# Patient Record
Sex: Male | Born: 1950 | Race: White | Hispanic: No | Marital: Married | Smoking: Former smoker
Health system: Southern US, Community
[De-identification: ages and names within clinical notes are randomized; demographics above are authoritative.]

## PROBLEM LIST (undated history)

## (undated) DIAGNOSIS — J9819 Other pulmonary collapse: Secondary | ICD-10-CM

## (undated) DIAGNOSIS — M1990 Unspecified osteoarthritis, unspecified site: Secondary | ICD-10-CM

## (undated) DIAGNOSIS — E119 Type 2 diabetes mellitus without complications: Secondary | ICD-10-CM

## (undated) DIAGNOSIS — I10 Essential (primary) hypertension: Secondary | ICD-10-CM

## (undated) HISTORY — PX: SHOULDER ARTHROCENTESIS: SUR47

---

## 1983-06-10 HISTORY — PX: LUNG BIOPSY: SHX232

## 2004-07-23 ENCOUNTER — Emergency Department: Payer: Self-pay | Admitting: Emergency Medicine

## 2006-01-01 ENCOUNTER — Ambulatory Visit (HOSPITAL_COMMUNITY): Admission: RE | Admit: 2006-01-01 | Discharge: 2006-01-01 | Payer: Self-pay | Admitting: Internal Medicine

## 2006-01-19 ENCOUNTER — Encounter (HOSPITAL_COMMUNITY): Admission: RE | Admit: 2006-01-19 | Discharge: 2006-02-18 | Payer: Self-pay | Admitting: Internal Medicine

## 2008-05-28 ENCOUNTER — Emergency Department (HOSPITAL_COMMUNITY): Admission: EM | Admit: 2008-05-28 | Discharge: 2008-05-28 | Payer: Self-pay | Admitting: Emergency Medicine

## 2008-10-14 ENCOUNTER — Emergency Department (HOSPITAL_COMMUNITY): Admission: EM | Admit: 2008-10-14 | Discharge: 2008-10-14 | Payer: Self-pay | Admitting: Emergency Medicine

## 2010-06-07 LAB — COMPREHENSIVE METABOLIC PANEL
AST: 33 U/L (ref 0–37)
Alkaline Phosphatase: 59 U/L (ref 39–117)
BUN: 12 mg/dL (ref 6–23)
Calcium: 9 mg/dL (ref 8.4–10.5)
Chloride: 103 mEq/L (ref 96–112)
Creatinine, Ser: 1.3 mg/dL (ref 0.4–1.5)
GFR calc Af Amer: >60 mL/min (ref >60)
Total Bilirubin: 0.6 mg/dL (ref 0.3–1.2)
Total Protein: 7.2 g/dL (ref 6.0–8.3)

## 2010-06-07 LAB — CBC
HCT: 42.8 % (ref 39.0–52.0)
Hemoglobin: 14.6 g/dL (ref 13.0–17.0)
MCHC: 34 g/dL (ref 30.0–36.0)
Platelets: 230 10*3/uL (ref 150–400)
RBC: 4.92 MIL/uL (ref 4.22–5.81)
WBC: 6.5 10*3/uL (ref 4.0–10.5)

## 2010-06-07 LAB — DIFFERENTIAL
Basophils Absolute: 0 10*3/uL (ref 0.0–0.1)
Basophils Relative: 0 % (ref 0–1)
Eosinophils Absolute: 0 10*3/uL (ref 0.0–0.7)
Eosinophils Relative: 0 % (ref 0–5)
Monocytes Relative: 10 % (ref 3–12)
Neutro Abs: 4.8 10*3/uL (ref 1.7–7.7)

## 2010-06-07 LAB — URINALYSIS, ROUTINE W REFLEX MICROSCOPIC
Glucose, UA: >1000 mg/dL — AB
Leukocytes, UA: NEGATIVE
Nitrite: NEGATIVE

## 2010-06-07 LAB — URINE MICROSCOPIC-ADD ON

## 2010-06-07 LAB — LIPASE, BLOOD: Lipase: 24 U/L (ref 11–59)

## 2010-06-12 LAB — URINALYSIS, ROUTINE W REFLEX MICROSCOPIC
Bilirubin Urine: NEGATIVE
Glucose, UA: >1000 mg/dL — AB
Protein, ur: 30 mg/dL — AB
Specific Gravity, Urine: 1.02 (ref 1.005–1.030)
Urobilinogen, UA: 0.2 mg/dL (ref 0.0–1.0)
pH: 6 (ref 5.0–8.0)

## 2010-06-12 LAB — CBC
HCT: 45.9 % (ref 39.0–52.0)
MCV: 86.9 fL (ref 78.0–100.0)
Platelets: 245 10*3/uL (ref 150–400)
RBC: 5.28 MIL/uL (ref 4.22–5.81)
RDW: 14.5 % (ref 11.5–15.5)
WBC: 8.3 10*3/uL (ref 4.0–10.5)

## 2010-06-12 LAB — GLUCOSE, CAPILLARY
Glucose-Capillary: 322 mg/dL — ABNORMAL HIGH (ref 70–99)
Glucose-Capillary: 324 mg/dL — ABNORMAL HIGH (ref 70–99)

## 2010-06-12 LAB — BASIC METABOLIC PANEL
BUN: 9 mg/dL (ref 6–23)
Calcium: 9.2 mg/dL (ref 8.4–10.5)
GFR calc non Af Amer: >60 mL/min (ref >60)
Sodium: 136 mEq/L (ref 135–145)

## 2010-06-12 LAB — DIFFERENTIAL
Basophils Absolute: 0 10*3/uL (ref 0.0–0.1)
Eosinophils Absolute: 0 10*3/uL (ref 0.0–0.7)
Eosinophils Relative: 0 % (ref 0–5)

## 2015-11-19 ENCOUNTER — Emergency Department (HOSPITAL_COMMUNITY)
Admission: EM | Admit: 2015-11-19 | Discharge: 2015-11-19 | Disposition: A | Discharge status: Home / Self Care | Payer: Self-pay | Attending: Emergency Medicine | Admitting: Emergency Medicine

## 2015-11-19 ENCOUNTER — Emergency Department (HOSPITAL_COMMUNITY): Payer: Self-pay

## 2015-11-19 ENCOUNTER — Encounter (HOSPITAL_COMMUNITY): Payer: Self-pay | Admitting: Emergency Medicine

## 2015-11-19 DIAGNOSIS — J069 Acute upper respiratory infection, unspecified: Secondary | ICD-10-CM | POA: Insufficient documentation

## 2015-11-19 DIAGNOSIS — H6502 Acute serous otitis media, left ear: Secondary | ICD-10-CM | POA: Insufficient documentation

## 2015-11-19 DIAGNOSIS — I10 Essential (primary) hypertension: Secondary | ICD-10-CM | POA: Insufficient documentation

## 2015-11-19 DIAGNOSIS — E119 Type 2 diabetes mellitus without complications: Secondary | ICD-10-CM | POA: Insufficient documentation

## 2015-11-19 HISTORY — DX: Essential (primary) hypertension: I10

## 2015-11-19 HISTORY — DX: Other pulmonary collapse: J98.19

## 2015-11-19 HISTORY — DX: Type 2 diabetes mellitus without complications: E11.9

## 2015-11-19 MED ORDER — AMOXICILLIN-POT CLAVULANATE 875-125 MG PO TABS: 1.0000 | ORAL_TABLET | Freq: Two times a day (BID) | ORAL | 0 refills | Status: DC

## 2015-11-19 NOTE — ED Provider Notes (Signed)
[place] DEPT Provider Note   CSN: [id number] Arrival date & time: 11/19/15  1540     History   Chief Complaint Chief Complaint  Patient presents with  . Otalgia    HPI Blake Rodriguez is a 65 y.o. male with a past medical history of diabetes, hypertension and hearing loss who presents emergency Department with chief complaint of left ear pressure and nasal congestion. Patient states he developed nasal congestion about 2 weeks ago. He has had some sneezing, facial pressure.  Past several days he's noticed worsening pressure and decreased hearing on the left side. He has had some associated pain. Patient states that sometimes he  Gets a bit dizzy if he turns his head to the left. Denies fevers, chills, myalgias, arthralgias. Denies DOE, SOB, chest tightness or pressure, radiation to left arm, jaw or back, or diaphoresis. Denies dysuria, flank pain, suprapubic pain, frequency, urgency, or hematuria. Denies headaches, light headedness, weakness, visual disturbances. Denies abdominal pain, nausea, vomiting, diarrhea or constipation.   HPI  Past Medical History:  Diagnosis Date  . Diabetes mellitus without complication ([place])   . Hypertension   . Lung collapse     There are no active problems to display for this patient.   Past Surgical History:  Procedure Laterality Date  . SHOULDER ARTHROCENTESIS         Home Medications    Prior to Admission medications   Not on File    Family History History reviewed. No pertinent family history.  Social History Social History  Substance Use Topics  . Smoking status: Never Smoker  . Smokeless tobacco: Never Used  . Alcohol use Yes     Comment: occassional     Allergies   Review of patient's allergies indicates not on file.   Review of Systems Review of Systems  Ten systems reviewed and are negative for acute change, except as noted in the HPI.   Physical Exam Updated Vital Signs BP 168/70 (BP Location: Left  Arm)   Pulse 79   Temp 98.6 F (37 C) (Oral)   Resp 20   Ht 5\' 10"  (1.778 m)   Wt 77.1 kg   SpO2 98%   BMI 24.39 kg/m   Physical Exam  Constitutional: He appears well-developed and well-nourished. No distress.  HENT:  Head: Normocephalic and atraumatic.  Right Ear: Hearing and tympanic membrane normal.  Left Ear: Ear canal normal. No mastoid tenderness. A middle ear effusion is present. No hemotympanum. Decreased hearing is noted.  Ears:  Eyes: Conjunctivae are normal. No scleral icterus.  Neck: Normal range of motion. Neck supple.  Cardiovascular: Normal rate, regular rhythm and normal heart sounds.   Pulmonary/Chest: Effort normal and breath sounds normal. No respiratory distress.  Abdominal: Soft. There is no tenderness.  Musculoskeletal: He exhibits no edema.  Neurological: He is alert.  Skin: Skin is warm and dry. He is not diaphoretic.  Psychiatric: His behavior is normal.  Nursing note and vitals reviewed.    ED Treatments / Results  Labs (all labs ordered are listed, but only abnormal results are displayed) Labs Reviewed - No data to display  EKG  EKG Interpretation None       Radiology Dg Chest 2 View  Result Date: 11/19/2015 CLINICAL DATA:  Subacute onset of productive cough and sinus congestion. Initial encounter. EXAM: CHEST  2 VIEW COMPARISON:  None. FINDINGS: The lungs are well-aerated. Right lateral pleural thickening is noted, with underlying postoperative change. Right apical scarring is seen. There  is no evidence of pleural effusion or pneumothorax. The heart is normal in size; the mediastinal contour is within normal limits. No acute osseous abnormalities are seen. IMPRESSION: 1. No acute cardiopulmonary process seen. 2. Chronic right-sided lung changes noted. Electronically Signed   By: Garald Balding M.D.   On: 11/19/2015 18:44    Procedures Procedures (including critical care time)  Medications Ordered in ED Medications - No data to  display   Initial Impression / Assessment and Plan / ED Course  I have reviewed the triage vital signs and the nursing notes.  Pertinent labs & imaging results that were available during my care of the patient were reviewed by me and considered in my medical decision making (see chart for details).  Clinical Course    Patient presents with otalgia and exam consistent with acute otitis media. No concern for acute mastoiditis, meningitis.   Patient discharged home with Augmentin.  Advised f/u with ENT  I have also discussed reasons to return immediately to the ER.  Patient expresses understanding and agrees with plan.     Final Clinical Impressions(s) / ED Diagnoses   Final diagnoses:  Acute serous otitis media of left ear, recurrence not specified  URI (upper respiratory infection)    New Prescriptions New Prescriptions   No medications on file     Margarita Mail, PA-C 11/19/15 1941    Elnora Morrison, MD 11/22/15 1427

## 2015-11-19 NOTE — Discharge Instructions (Signed)
You may have a cholesteatoma behind the Right ear drum. You will need to see an ear nose and throat doctor. You also have an ear infection on the left.  Please take all of your antibiotics until finished!   You may develop abdominal discomfort or diarrhea from the antibiotic.  You may help offset this with probiotics which you can buy or get in yogurt. Do not eat  or take the probiotics until 2 hours after your antibiotic.

## 2015-11-19 NOTE — ED Triage Notes (Signed)
Pt complaining of cold/sinus x 2 weeeks Ears are getting stopped up When he coughs he gets dizzy Pt sounds hoarse  Pt states that cold is better than it was.

## 2016-07-01 ENCOUNTER — Encounter (HOSPITAL_COMMUNITY): Payer: Self-pay | Admitting: Emergency Medicine

## 2016-07-01 ENCOUNTER — Emergency Department (HOSPITAL_COMMUNITY)
Admission: EM | Admit: 2016-07-01 | Discharge: 2016-07-01 | Disposition: A | Discharge status: Home / Self Care | Payer: PRIVATE HEALTH INSURANCE | Attending: Emergency Medicine | Admitting: Emergency Medicine

## 2016-07-01 DIAGNOSIS — M545 Low back pain, unspecified: Secondary | ICD-10-CM

## 2016-07-01 DIAGNOSIS — Z7984 Long term (current) use of oral hypoglycemic drugs: Secondary | ICD-10-CM | POA: Insufficient documentation

## 2016-07-01 DIAGNOSIS — Y929 Unspecified place or not applicable: Secondary | ICD-10-CM | POA: Insufficient documentation

## 2016-07-01 DIAGNOSIS — S39012A Strain of muscle, fascia and tendon of lower back, initial encounter: Secondary | ICD-10-CM | POA: Diagnosis not present

## 2016-07-01 DIAGNOSIS — Z79899 Other long term (current) drug therapy: Secondary | ICD-10-CM | POA: Insufficient documentation

## 2016-07-01 DIAGNOSIS — I10 Essential (primary) hypertension: Secondary | ICD-10-CM | POA: Insufficient documentation

## 2016-07-01 DIAGNOSIS — S3992XA Unspecified injury of lower back, initial encounter: Secondary | ICD-10-CM | POA: Diagnosis present

## 2016-07-01 DIAGNOSIS — Y990 Civilian activity done for income or pay: Secondary | ICD-10-CM | POA: Diagnosis not present

## 2016-07-01 DIAGNOSIS — E119 Type 2 diabetes mellitus without complications: Secondary | ICD-10-CM | POA: Diagnosis not present

## 2016-07-01 DIAGNOSIS — Y93E5 Activity, floor mopping and cleaning: Secondary | ICD-10-CM | POA: Diagnosis not present

## 2016-07-01 DIAGNOSIS — X503XXA Overexertion from repetitive movements, initial encounter: Secondary | ICD-10-CM | POA: Insufficient documentation

## 2016-07-01 MED ORDER — IBUPROFEN 400 MG PO TABS
400.0000 mg | ORAL_TABLET | Freq: Once | ORAL | Status: AC
  Administered 2016-07-01: 400 mg via ORAL
  Filled 2016-07-01: qty 1

## 2016-07-01 MED ORDER — METHOCARBAMOL 500 MG PO TABS
500.0000 mg | ORAL_TABLET | Freq: Once | ORAL | Status: AC
  Administered 2016-07-01: 500 mg via ORAL
  Filled 2016-07-01: qty 1

## 2016-07-01 MED ORDER — IBUPROFEN 400 MG PO TABS: 400.0000 mg | ORAL_TABLET | Freq: Four times a day (QID) | ORAL | 0 refills | Status: DC

## 2016-07-01 MED ORDER — TRAMADOL HCL 50 MG PO TABS
100.0000 mg | ORAL_TABLET | Freq: Once | ORAL | Status: AC
  Administered 2016-07-01: 100 mg via ORAL
  Filled 2016-07-01: qty 2

## 2016-07-01 MED ORDER — TRAMADOL HCL 50 MG PO TABS: 50.0000 mg | ORAL_TABLET | Freq: Four times a day (QID) | ORAL | 0 refills | Status: DC | PRN

## 2016-07-01 MED ORDER — ONDANSETRON HCL 4 MG PO TABS
4.0000 mg | ORAL_TABLET | Freq: Once | ORAL | Status: AC
  Administered 2016-07-01: 4 mg via ORAL
  Filled 2016-07-01: qty 1

## 2016-07-01 MED ORDER — METHOCARBAMOL 500 MG PO TABS: 500.0000 mg | ORAL_TABLET | Freq: Three times a day (TID) | ORAL | 0 refills | Status: DC

## 2016-07-01 NOTE — ED Triage Notes (Signed)
Pt c/o intermittent left lower back pain "a few months". Worse the past few days. States mops a lot with his job. Pain only with movement. Denies gu sx. nad

## 2016-07-01 NOTE — Discharge Instructions (Signed)
Please rest your back is much as possible. Please use a heating pad when you're sitting, or lying down. Use 400 mg of ibuprofen with breakfast, lunch, dinner, and at bedtime. Use Robaxin 3 times daily for spasm pain. This medication may cause drowsiness, please use caution changing positions and getting around. May use Ultram for more severe pain. This medicine may cause drowsiness, please do not drive or operate machinery when taking this medication. Please use caution when changing positions or getting around. Please see Dr. Aline Brochure for orthopedic evaluation if not improving in the next 5-7 days.

## 2016-07-02 NOTE — ED Provider Notes (Signed)
[facility] Provider Note   CSN: [id number] Arrival date & time: 07/01/16  0841     History   Chief Complaint Chief Complaint  Patient presents with  . Back Pain    HPI Blake Rodriguez is a 66 y.o. male.  Patient is a 66 year old male who presents to the emergency department with complaint of back pain.  The patient states that he has been having problems with his lower back for 2 or 3 months. He states that usually he can rest, or takes over-the-counter medication and it goes away. 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 Over the last day or 2 he is been having more and more problems getting rid of the back pain. He now has pain with change of position on. Pain is worse when he moves from a sitting to a standing position or from a standing to lying down position. He has not had any loss of bowel or bladder function. He's not had any numbness or loss of feeling in the inner thigh area or in the genital areas. He's not had any unusual falls or weakness.      Past Medical History:  Diagnosis Date  . Diabetes mellitus without complication ([place])   . Hypertension   . Lung collapse     There are no active problems to display for this patient.   Past Surgical History:  Procedure Laterality Date  . SHOULDER ARTHROCENTESIS         Home Medications    Prior to Admission medications   Medication Sig Start Date End Date Taking? Authorizing Provider  amLODipine (NORVASC) 10 MG tablet Take 10 mg by mouth daily.   Yes Historical Provider, MD  glipiZIDE (GLUCOTROL) 5 MG tablet Take 5 mg by mouth 2 (two) times daily before a meal.   Yes Historical Provider, MD  lisinopril (PRINIVIL,ZESTRIL) 10 MG tablet Take 10 mg by mouth daily.   Yes Historical Provider, MD  amoxicillin-clavulanate (AUGMENTIN) 875-125 MG tablet Take 1 tablet by mouth 2 (two) times daily. One po bid x 7  days Patient not taking: Reported on 07/01/2016 11/19/15   Margarita Mail, PA-C  ibuprofen (ADVIL,MOTRIN) 400 MG tablet Take 1 tablet (400 mg total) by mouth 4 (four) times daily. 07/01/16   Lily Kocher, PA-C  methocarbamol (ROBAXIN) 500 MG tablet Take 1 tablet (500 mg total) by mouth 3 (three) times daily. 07/01/16   Lily Kocher, PA-C  traMADol (ULTRAM) 50 MG tablet Take 1 tablet (50 mg total) by mouth every 6 (six) hours as needed. 07/01/16   Lily Kocher, PA-C    Family History History reviewed. No pertinent family history.  Social History Social History  Substance Use Topics  . Smoking status: Never Smoker  . Smokeless tobacco: Never Used  . Alcohol use Yes     Comment: occassional     Allergies   Patient has no allergy information on record.   Review of Systems Review of Systems  Constitutional: Negative for activity change.       All ROS Neg except as noted in HPI  HENT: Negative for nosebleeds.   Eyes: Negative for photophobia and discharge.  Respiratory: Negative for cough, shortness of breath and wheezing.   Cardiovascular: Negative for chest pain and palpitations.  Gastrointestinal: Negative for abdominal pain and blood in stool.  Genitourinary: Negative for dysuria, frequency and hematuria.  Musculoskeletal:  Positive for back pain. Negative for arthralgias and neck pain.  Skin: Negative.   Neurological: Negative for dizziness, seizures and speech difficulty.  Psychiatric/Behavioral: Negative for confusion and hallucinations.     Physical Exam Updated Vital Signs BP (!) 162/82 (BP Location: Right Arm)   Pulse 77   Temp 97.6 F (36.4 C) (Oral)   Resp 18   SpO2 99%   Physical Exam  Constitutional: He is oriented to person, place, and time. He appears well-developed and well-nourished.  Non-toxic appearance.  HENT:  Head: Normocephalic.  Right Ear: Tympanic membrane and external ear normal.  Left Ear: Tympanic membrane and external ear normal.  Eyes: EOM and  lids are normal. Pupils are equal, round, and reactive to light.  Neck: Normal range of motion. Neck supple. Carotid bruit is not present.  Cardiovascular: Normal rate, regular rhythm, normal heart sounds, intact distal pulses and normal pulses.   Pulmonary/Chest: Breath sounds normal. No respiratory distress.  Abdominal: Soft. Bowel sounds are normal. There is no tenderness. There is no guarding.  Musculoskeletal:       Lumbar back: He exhibits decreased range of motion and spasm.       Back:  Lymphadenopathy:       Head (right side): No submandibular adenopathy present.       Head (left side): No submandibular adenopathy present.    He has no cervical adenopathy.  Neurological: He is alert and oriented to person, place, and time. He has normal strength. No cranial nerve deficit or sensory deficit.  Skin: Skin is warm and dry.  Psychiatric: He has a normal mood and affect. His speech is normal.  Nursing note and vitals reviewed.    ED Treatments / Results  Labs (all labs ordered are listed, but only abnormal results are displayed) Labs Reviewed - No data to display  EKG  EKG Interpretation None       Radiology No results found.  Procedures Procedures (including critical care time)  Medications Ordered in ED Medications  methocarbamol (ROBAXIN) tablet 500 mg (500 mg Oral Given 07/01/16 1004)  ibuprofen (ADVIL,MOTRIN) tablet 400 mg (400 mg Oral Given 07/01/16 1004)  traMADol (ULTRAM) tablet 100 mg (100 mg Oral Given 07/01/16 1004)  ondansetron (ZOFRAN) tablet 4 mg (4 mg Oral Given 07/01/16 1004)     Initial Impression / Assessment and Plan / ED Course  I have reviewed the triage vital signs and the nursing notes.  Pertinent labs & imaging results that were available during my care of the patient were reviewed by me and considered in my medical decision making (see chart for details).       Final Clinical Impressions(s) / ED Diagnoses MDM The patient's blood pressure  is slightly elevated, otherwise the vital signs within normal limits. Pulse oximetry is 99% on room air. Within normal limits by my interpretation. The examination shows no evidence of acute neurologic deficit. In particular there is no evidence for caudal equina. The examination favors muscle strain involving the lumbar region.  I've asked patient to use heating pad to the area. I've asked him to rest his back over the next few days. Prescription for Ultram, Robaxin, and low-dose ibuprofen given to the patient. We discuss the possibility of these medications causing drowsiness. The patient has been asked to use caution when using the muscle relaxer and the pain medication. He acknowledges understanding of these discharge instructions. As well as his spouse.    Final diagnoses:  Strain of lumbar region, initial encounter  Left-sided low back pain without sciatica, unspecified chronicity    New Prescriptions Discharge Medication List as of 07/01/2016 10:02 AM    START taking these medications   Details  ibuprofen (ADVIL,MOTRIN) 400 MG tablet Take 1 tablet (400 mg total) by mouth 4 (four) times daily., Starting Wed 07/01/2016, Print    methocarbamol (ROBAXIN) 500 MG tablet Take 1 tablet (500 mg total) by mouth 3 (three) times daily., Starting Wed 07/01/2016, Print    traMADol (ULTRAM) 50 MG tablet Take 1 tablet (50 mg total) by mouth every 6 (six) hours as needed., Starting Wed 07/01/2016, Print         Lily Kocher, PA-C 07/02/16 1956    Nat Christen, MD 07/04/16 [phone]

## 2017-08-01 ENCOUNTER — Emergency Department (HOSPITAL_COMMUNITY)
Admission: EM | Admit: 2017-08-01 | Discharge: 2017-08-01 | Disposition: A | Discharge status: Home / Self Care | Payer: Managed Care, Other (non HMO) | Attending: Emergency Medicine | Admitting: Emergency Medicine

## 2017-08-01 ENCOUNTER — Other Ambulatory Visit: Payer: Self-pay

## 2017-08-01 ENCOUNTER — Encounter (HOSPITAL_COMMUNITY): Payer: Self-pay | Admitting: *Deleted

## 2017-08-01 DIAGNOSIS — Y92009 Unspecified place in unspecified non-institutional (private) residence as the place of occurrence of the external cause: Secondary | ICD-10-CM | POA: Insufficient documentation

## 2017-08-01 DIAGNOSIS — I10 Essential (primary) hypertension: Secondary | ICD-10-CM | POA: Diagnosis not present

## 2017-08-01 DIAGNOSIS — Z87891 Personal history of nicotine dependence: Secondary | ICD-10-CM | POA: Diagnosis not present

## 2017-08-01 DIAGNOSIS — Y929 Unspecified place or not applicable: Secondary | ICD-10-CM | POA: Insufficient documentation

## 2017-08-01 DIAGNOSIS — E119 Type 2 diabetes mellitus without complications: Secondary | ICD-10-CM | POA: Diagnosis not present

## 2017-08-01 DIAGNOSIS — T07XXXA Unspecified multiple injuries, initial encounter: Secondary | ICD-10-CM

## 2017-08-01 DIAGNOSIS — Y939 Activity, unspecified: Secondary | ICD-10-CM | POA: Insufficient documentation

## 2017-08-01 DIAGNOSIS — Z79899 Other long term (current) drug therapy: Secondary | ICD-10-CM | POA: Diagnosis not present

## 2017-08-01 DIAGNOSIS — R51 Headache: Secondary | ICD-10-CM | POA: Diagnosis present

## 2017-08-01 DIAGNOSIS — Y999 Unspecified external cause status: Secondary | ICD-10-CM | POA: Insufficient documentation

## 2017-08-01 DIAGNOSIS — W16212A Fall in (into) filled bathtub causing other injury, initial encounter: Secondary | ICD-10-CM | POA: Diagnosis not present

## 2017-08-01 DIAGNOSIS — T148XXA Other injury of unspecified body region, initial encounter: Secondary | ICD-10-CM | POA: Insufficient documentation

## 2017-08-01 DIAGNOSIS — W19XXXA Unspecified fall, initial encounter: Secondary | ICD-10-CM

## 2017-08-01 NOTE — ED Provider Notes (Signed)
[facility] EMERGENCY DEPARTMENT Provider Note   CSN: [id number] Arrival date & time: 08/01/17  1540     History   Chief Complaint Chief Complaint  Patient presents with  . Fall    HPI Blake Rodriguez is a 67 y.o. male.  Chief complaint is "my family will not leave me alone until I get evaluated".  HPI: 67 year old male.  His granddaughter had a piano recital 2 days ago.  He hurried home from work.  He was in the shower.  He states there was a lot of soap and sides.  The water became very hot and he moved backwards quickly.  When he did, his feet slipped and he fell backwards.  He struck his head against the tile wall.  No loss of conscious.  No confusion.  No nausea vomiting.  No vision changes.  No numbness or weakness to the extremities.  He has some discomfort along his shoulders  Past Medical History:  Diagnosis Date  . Diabetes mellitus without complication ([place])   . Hypertension   . Lung collapse     There are no active problems to display for this patient.   Past Surgical History:  Procedure Laterality Date  . SHOULDER ARTHROCENTESIS          Home Medications    Prior to Admission medications   Medication Sig Start Date End Date Taking? Authorizing Provider  amLODipine (NORVASC) 10 MG tablet Take 10 mg by mouth daily.   Yes [provider]  glipiZIDE (GLUCOTROL) 5 MG tablet Take 5 mg by mouth 2 (two) times daily before a meal.   Yes [provider]  ibuprofen (ADVIL,MOTRIN) 400 MG tablet Take 1 tablet (400 mg total) by mouth 4 (four) times daily. Patient taking differently: Take 400 mg by mouth every 4 (four) hours as needed.  07/01/16  Yes Lily Kocher, PA-C  lisinopril (PRINIVIL,ZESTRIL) 10 MG tablet Take 10 mg by mouth daily.   Yes [provider]    Family History History reviewed. No pertinent family history.  Social History Social History   Tobacco Use  . Smoking status: Former Research scientist (life sciences)  . Smokeless tobacco: Never Used    Substance Use Topics  . Alcohol use: Yes    Comment: occassional  . Drug use: No     Allergies   Patient has no known allergies.   Review of Systems Review of Systems  Constitutional: Negative for appetite change, chills, diaphoresis, fatigue and fever.  HENT: Negative for mouth sores, sore throat and trouble swallowing.   Eyes: Negative for visual disturbance.  Respiratory: Negative for cough, chest tightness, shortness of breath and wheezing.   Cardiovascular: Negative for chest pain.  Gastrointestinal: Negative for abdominal distention, abdominal pain, diarrhea, nausea and vomiting.  Endocrine: Negative for polydipsia, polyphagia and polyuria.  Genitourinary: Negative for dysuria, frequency and hematuria.  Musculoskeletal: Negative for gait problem.  Skin: Negative for color change, pallor and rash.  Neurological: Positive for headaches. Negative for dizziness, syncope and light-headedness.  Hematological: Does not bruise/bleed easily.  Psychiatric/Behavioral: Negative for behavioral problems and confusion.     Physical Exam Updated Vital Signs BP (!) 179/102 (BP Location: Right Arm)   Pulse 90   Temp 99.1 F (37.3 C) (Temporal)   Resp 18   Ht 5\' 10"  (1.778 m)   Wt 77.1 kg (170 lb)   SpO2 98%   BMI 24.39 kg/m   Physical Exam  Constitutional: He is oriented to person, place, and time. He appears  well-developed and well-nourished. No distress.  HENT:  Head: Normocephalic.  Atraumatic evaluation had no soft tissue swelling.  No ecchymosis.  No blood over the TMs, mastoids, or from ears nose or mouth.  Nontender of the midline neck and spine.  Eyes: Pupils are equal, round, and reactive to light. Conjunctivae are normal. No scleral icterus.  Neck: Normal range of motion. Neck supple. No thyromegaly present.  Cardiovascular: Normal rate and regular rhythm. Exam reveals no gallop and no friction rub.  No murmur heard. Pulmonary/Chest: Effort normal and breath sounds  normal. No respiratory distress. He has no wheezes. He has no rales.  Abdominal: Soft. Bowel sounds are normal. He exhibits no distension. There is no tenderness. There is no rebound.  Musculoskeletal: Normal range of motion.  Neurological: He is alert and oriented to person, place, and time.  Skin: Skin is warm and dry. No rash noted.  Psychiatric: He has a normal mood and affect. His behavior is normal.     ED Treatments / Results  Labs (all labs ordered are listed, but only abnormal results are displayed) Labs Reviewed - No data to display  EKG None  Radiology No results found.  Procedures Procedures (including critical care time)  Medications Ordered in ED Medications - No data to display   Initial Impression / Assessment and Plan / ED Course  I have reviewed the triage vital signs and the nursing notes.  Pertinent labs & imaging results that were available during my care of the patient were reviewed by me and considered in my medical decision making (see chart for details).    During exam.  No indication for CNS imaging.  Discharged home.  Expectant management.  Over-the-counter meds as needed.  Final Clinical Impressions(s) / ED Diagnoses   Final diagnoses:  Multiple contusions  Fall in home, initial encounter    ED Discharge Orders    None       Tanna Furry, MD 08/01/17 2041

## 2017-08-01 NOTE — Discharge Instructions (Addendum)
Recheck with any new or worsening concerns.

## 2017-08-01 NOTE — ED Triage Notes (Signed)
Pt fell last night, hitting his head on the wall, denies LOC, denies blood thinners.  C/o HA and left elbow pain

## 2017-10-13 ENCOUNTER — Ambulatory Visit (INDEPENDENT_AMBULATORY_CARE_PROVIDER_SITE_OTHER): Payer: Managed Care, Other (non HMO) | Admitting: Podiatry

## 2017-10-13 ENCOUNTER — Ambulatory Visit (INDEPENDENT_AMBULATORY_CARE_PROVIDER_SITE_OTHER): Payer: Managed Care, Other (non HMO)

## 2017-10-13 ENCOUNTER — Other Ambulatory Visit: Payer: Self-pay | Admitting: Podiatry

## 2017-10-13 ENCOUNTER — Encounter: Payer: Self-pay | Admitting: Podiatry

## 2017-10-13 VITALS — BP 138/73 | HR 77

## 2017-10-13 DIAGNOSIS — M216X2 Other acquired deformities of left foot: Secondary | ICD-10-CM

## 2017-10-13 DIAGNOSIS — R52 Pain, unspecified: Secondary | ICD-10-CM | POA: Diagnosis not present

## 2017-10-13 DIAGNOSIS — M2012 Hallux valgus (acquired), left foot: Secondary | ICD-10-CM

## 2017-10-13 DIAGNOSIS — M2011 Hallux valgus (acquired), right foot: Secondary | ICD-10-CM | POA: Diagnosis not present

## 2017-10-13 DIAGNOSIS — Q828 Other specified congenital malformations of skin: Secondary | ICD-10-CM

## 2017-10-13 NOTE — Progress Notes (Signed)
This patient presents the office with chief complaint of severe pain and burning on the bottom of his left foot.  He says he has a painful callus that is painful walking and wearing his shoes.  He says he has previously trimmed the callus himself but it always returns.  He also says he has developed a very painful bunion on his left foot. He says he has no pain associated with right foot.  This toe is painful and has difficulty wearing shoes except for his sandals.  This patient was referred to the office from the [place].  He presents the office today for an evaluation and treatment of his painful left forefoot.  General Appearance  Alert, conversant and in no acute stress.  Vascular  Dorsalis pedis and posterior tibial  pulses are palpable  bilaterally.  Capillary return is within normal limits  bilaterally. Temperature is within normal limits  bilaterally.  Neurologic  Senn-Weinstein monofilament wire diminished   bilaterally. Muscle power within normal limits bilaterally.  Nails Normal nails noted with no evidence of bacterial or fungal infection.  Orthopedic  No limitations of motion of motion feet .  No crepitus or effusions noted.  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  Porokeratosis sub 4th met left foot.  Mild HAV right.  Skin  normotropic skin  noted bilaterally.  No signs of infections or ulcers noted.  Porokeratosis sub 4  IE.   X-rays reveal a severe bunion deformity first MPJ of the left foot.  Patient has developed a porokeratosis sub 4th met left foot.  Debridement of porokeratosis was performed.  After discussion of his pathology with this patient, he requests an evaluation with a surgical podiatrist from this practice.  RTC prn.   Gardiner Barefoot DPM

## 2017-10-22 ENCOUNTER — Ambulatory Visit (INDEPENDENT_AMBULATORY_CARE_PROVIDER_SITE_OTHER): Payer: Medicare Other | Admitting: Podiatry

## 2017-10-22 ENCOUNTER — Encounter: Payer: Self-pay | Admitting: Podiatry

## 2017-10-22 DIAGNOSIS — E1151 Type 2 diabetes mellitus with diabetic peripheral angiopathy without gangrene: Secondary | ICD-10-CM | POA: Diagnosis not present

## 2017-10-22 DIAGNOSIS — M2012 Hallux valgus (acquired), left foot: Secondary | ICD-10-CM

## 2017-10-22 DIAGNOSIS — Q828 Other specified congenital malformations of skin: Secondary | ICD-10-CM

## 2017-10-22 DIAGNOSIS — M2011 Hallux valgus (acquired), right foot: Secondary | ICD-10-CM

## 2017-10-25 NOTE — Progress Notes (Signed)
Subjective: 67 year old male presents the office today for possible surgical consultation of his left foot.  Is a painful callus on the left foot as well as a bunion on the left foot although the bunion is not painful.  He states the toe described overlapping but they consider surgery for this.  He is diabetic and does not know his last A1c but he states that may have increased some last appointment.  Upon asking about claudication symptoms he does appear that he has had a vascular test done recently at the [place] and he was told he had a mild blockage but no intervention.  He said no treatment for the bunion and is only tried debridement of the callus in the left foot once. Denies any systemic complaints such as fevers, chills, nausea, vomiting. No acute changes since last appointment, and no other complaints at this time.   Objective: AAO x3, NAD DP pulse 2/4, PT pulse decreased, CRT < 3 seconds.  Hyperkeratotic lesion left foot between the fourth and fifth metatarsal heads.  This is minimal and there is no ulceration identified there is no drainage or pus or any swelling or redness.  Moderate HAV is present without any tenderness to palpation.  There is no pain or crepitation with MPJ range of motion.  No other areas of tenderness identified at this time.  No open lesions or pre-ulcerative lesions.  No pain with calf compression, swelling, warmth, erythema  Assessment: Hyperkeratotic lesion left foot with asymptomatic bunion  Plan: -All treatment options discussed with the patient including all alternatives, risks, complications.  -I independently reviewed the x-rays from previous with the patient as well.  -At this point he is having no significant discomfort to the bunion.  The callus is minimal today.  He is to have no significant conservative treatment.  We will eventually get diabetic shoes with inserts to help offload the areas that this was beneficial for him.  Also it appears that he has had  vascular studies performed at the [place] and I will try to get the results of this as well.  We will continue conservative care for now. -Patient encouraged to call the office with any questions, concerns, change in symptoms.   Trula Slade DPM

## 2017-11-08 ENCOUNTER — Telehealth: Payer: Self-pay | Admitting: *Deleted

## 2017-11-08 ENCOUNTER — Telehealth: Payer: Self-pay | Admitting: Podiatry

## 2017-11-08 ENCOUNTER — Encounter: Payer: Self-pay | Admitting: Podiatry

## 2017-11-08 NOTE — Telephone Encounter (Signed)
Please call pt back with results

## 2017-11-08 NOTE — Progress Notes (Signed)
Received from the [facility] his arterial studies  Right ABI 1.10  Left 0.86, compatible with mild arterial obstruction   If he elects to undergo elective foot surgery will need to be seen by vascular.

## 2017-11-08 NOTE — Telephone Encounter (Addendum)
Dr. Jacqualyn Posey states pt's circulation test show he does have a blockage and would need medical clearance prior consideration for surgery. I informed pt of Dr. Leigh Aurora review of results and pt states he is trying to get in touch with his PCP to discuss.

## 2017-11-08 NOTE — Telephone Encounter (Signed)
Dr. Jacqualyn Posey reviewed the ABI from the [facility] 08/26/2017, he states the results do show a blockage, if pt wants to consider bunion surgery he will need to get medical clearance. Left message on pt's mobile phone to call for results. Unable to leave a message on [phone] mailbox is full. Unable to leave a message on [phone] message states the call could not be completed as dialed.

## 2019-09-27 ENCOUNTER — Encounter (HOSPITAL_COMMUNITY): Payer: Self-pay

## 2019-09-27 ENCOUNTER — Emergency Department (HOSPITAL_COMMUNITY)
Admission: EM | Admit: 2019-09-27 | Discharge: 2019-09-28 | Discharge status: Against Medical Advice | Payer: Medicare PPO | Attending: Emergency Medicine | Admitting: Emergency Medicine

## 2019-09-27 ENCOUNTER — Other Ambulatory Visit: Payer: Self-pay

## 2019-09-27 DIAGNOSIS — K5900 Constipation, unspecified: Secondary | ICD-10-CM | POA: Diagnosis not present

## 2019-09-27 DIAGNOSIS — Z5321 Procedure and treatment not carried out due to patient leaving prior to being seen by health care provider: Secondary | ICD-10-CM | POA: Insufficient documentation

## 2019-09-27 NOTE — ED Triage Notes (Signed)
Pt presents to ED with complaints of constipation. Pt states last BM was 3 days ago. Pt states he tried having BM today but was unsuccessful. Pt has not taken any meds for constipation.

## 2019-09-28 ENCOUNTER — Emergency Department (HOSPITAL_COMMUNITY): Payer: Medicare PPO

## 2019-09-28 NOTE — ED Notes (Addendum)
Pt called rn to room stated that he did not want the x.r he said that he went to the restroom and had a good BM sp he just wants to go to go home. Informed he can come back if needed.

## 2019-09-28 NOTE — ED Provider Notes (Signed)
Patient not in room on attempted evaluation.   Ezequiel Essex, MD 09/28/19 [phone]

## 2021-04-26 ENCOUNTER — Emergency Department (HOSPITAL_COMMUNITY): Payer: No Typology Code available for payment source

## 2021-04-26 ENCOUNTER — Inpatient Hospital Stay (HOSPITAL_COMMUNITY)
Admission: EM | Admit: 2021-04-26 | Discharge: 2021-04-28 | DRG: 871 | Disposition: A | Discharge status: Home / Self Care | Payer: No Typology Code available for payment source | Attending: Family Medicine | Admitting: Family Medicine

## 2021-04-26 ENCOUNTER — Other Ambulatory Visit: Payer: Self-pay

## 2021-04-26 ENCOUNTER — Encounter (HOSPITAL_COMMUNITY): Payer: Self-pay

## 2021-04-26 DIAGNOSIS — Z7984 Long term (current) use of oral hypoglycemic drugs: Secondary | ICD-10-CM

## 2021-04-26 DIAGNOSIS — E860 Dehydration: Secondary | ICD-10-CM | POA: Diagnosis present

## 2021-04-26 DIAGNOSIS — E8729 Other acidosis: Secondary | ICD-10-CM | POA: Diagnosis not present

## 2021-04-26 DIAGNOSIS — E1165 Type 2 diabetes mellitus with hyperglycemia: Secondary | ICD-10-CM | POA: Diagnosis present

## 2021-04-26 DIAGNOSIS — R1031 Right lower quadrant pain: Secondary | ICD-10-CM

## 2021-04-26 DIAGNOSIS — E872 Acidosis, unspecified: Secondary | ICD-10-CM | POA: Diagnosis present

## 2021-04-26 DIAGNOSIS — Z20822 Contact with and (suspected) exposure to covid-19: Secondary | ICD-10-CM | POA: Diagnosis present

## 2021-04-26 DIAGNOSIS — Z79899 Other long term (current) drug therapy: Secondary | ICD-10-CM

## 2021-04-26 DIAGNOSIS — Z7982 Long term (current) use of aspirin: Secondary | ICD-10-CM | POA: Diagnosis not present

## 2021-04-26 DIAGNOSIS — E782 Mixed hyperlipidemia: Secondary | ICD-10-CM

## 2021-04-26 DIAGNOSIS — I10 Essential (primary) hypertension: Secondary | ICD-10-CM | POA: Diagnosis present

## 2021-04-26 DIAGNOSIS — A419 Sepsis, unspecified organism: Secondary | ICD-10-CM | POA: Diagnosis present

## 2021-04-26 DIAGNOSIS — R1084 Generalized abdominal pain: Secondary | ICD-10-CM | POA: Diagnosis present

## 2021-04-26 DIAGNOSIS — E785 Hyperlipidemia, unspecified: Secondary | ICD-10-CM

## 2021-04-26 DIAGNOSIS — K3532 Acute appendicitis with perforation and localized peritonitis, without abscess: Secondary | ICD-10-CM | POA: Diagnosis present

## 2021-04-26 DIAGNOSIS — N179 Acute kidney failure, unspecified: Secondary | ICD-10-CM | POA: Diagnosis present

## 2021-04-26 DIAGNOSIS — R109 Unspecified abdominal pain: Secondary | ICD-10-CM

## 2021-04-26 DIAGNOSIS — Z87891 Personal history of nicotine dependence: Secondary | ICD-10-CM

## 2021-04-26 DIAGNOSIS — N289 Disorder of kidney and ureter, unspecified: Secondary | ICD-10-CM | POA: Diagnosis not present

## 2021-04-26 LAB — COMPREHENSIVE METABOLIC PANEL
ALT: 17 U/L (ref 0–44)
AST: 20 U/L (ref 15–41)
Albumin: 4.1 g/dL (ref 3.5–5.0)
Alkaline Phosphatase: 65 U/L (ref 38–126)
Anion gap: 17 — ABNORMAL HIGH (ref 5–15)
BUN: 21 mg/dL (ref 8–23)
CO2: 16 mmol/L — ABNORMAL LOW (ref 22–32)
Calcium: 9.1 mg/dL (ref 8.9–10.3)
Chloride: 107 mmol/L (ref 98–111)
Creatinine, Ser: 1.59 mg/dL — ABNORMAL HIGH (ref 0.61–1.24)
GFR, Estimated: 47 mL/min — ABNORMAL LOW (ref >60)
Glucose, Bld: 176 mg/dL — ABNORMAL HIGH (ref 70–99)
Potassium: 3.8 mmol/L (ref 3.5–5.1)
Sodium: 140 mmol/L (ref 135–145)
Total Bilirubin: 1.7 mg/dL — ABNORMAL HIGH (ref 0.3–1.2)
Total Protein: 8.1 g/dL (ref 6.5–8.1)

## 2021-04-26 LAB — CBC WITH DIFFERENTIAL/PLATELET
Abs Immature Granulocytes: 0.06 10*3/uL (ref 0.00–0.07)
Basophils Absolute: 0 10*3/uL (ref 0.0–0.1)
Basophils Relative: 0 %
Eosinophils Absolute: 0.2 10*3/uL (ref 0.0–0.5)
Eosinophils Relative: 2 %
HCT: 51.7 % (ref 39.0–52.0)
Hemoglobin: 16.1 g/dL (ref 13.0–17.0)
Immature Granulocytes: 0 %
Lymphocytes Relative: 3 %
Lymphs Abs: 0.4 10*3/uL — ABNORMAL LOW (ref 0.7–4.0)
MCH: 29 pg (ref 26.0–34.0)
MCHC: 31.1 g/dL (ref 30.0–36.0)
MCV: 93.2 fL (ref 80.0–100.0)
Monocytes Absolute: 0.6 10*3/uL (ref 0.1–1.0)
Monocytes Relative: 4 %
Neutro Abs: 12.6 10*3/uL — ABNORMAL HIGH (ref 1.7–7.7)
Neutrophils Relative %: 91 %
Platelets: 209 10*3/uL (ref 150–400)
RBC: 5.55 MIL/uL (ref 4.22–5.81)
RDW: 14.5 % (ref 11.5–15.5)
WBC: 13.9 10*3/uL — ABNORMAL HIGH (ref 4.0–10.5)
nRBC: 0 % (ref 0.0–0.2)

## 2021-04-26 LAB — GLUCOSE, CAPILLARY
Glucose-Capillary: 129 mg/dL — ABNORMAL HIGH (ref 70–99)
Glucose-Capillary: 139 mg/dL — ABNORMAL HIGH (ref 70–99)
Glucose-Capillary: 134 mg/dL — ABNORMAL HIGH (ref 70–99)

## 2021-04-26 LAB — RESP PANEL BY RT-PCR (FLU A&B, COVID) ARPGX2
Influenza A by PCR: NEGATIVE
Influenza B by PCR: NEGATIVE
SARS Coronavirus 2 by RT PCR: NEGATIVE

## 2021-04-26 LAB — LACTIC ACID, PLASMA
Lactic Acid, Venous: 2.6 mmol/L (ref 0.5–1.9)
Lactic Acid, Venous: 1.5 mmol/L (ref 0.5–1.9)

## 2021-04-26 LAB — HEMOGLOBIN A1C
Hgb A1c MFr Bld: 7.6 % — ABNORMAL HIGH (ref 4.8–5.6)
Mean Plasma Glucose: 171.42 mg/dL

## 2021-04-26 LAB — LIPASE, BLOOD: Lipase: 34 U/L (ref 11–51)

## 2021-04-26 LAB — HIV ANTIBODY (ROUTINE TESTING W REFLEX): HIV Screen 4th Generation wRfx: NONREACTIVE

## 2021-04-26 LAB — CBG MONITORING, ED: Glucose-Capillary: 131 mg/dL — ABNORMAL HIGH (ref 70–99)

## 2021-04-26 MED ORDER — ENOXAPARIN SODIUM 40 MG/0.4ML IJ SOSY
40.0000 mg | PREFILLED_SYRINGE | INTRAMUSCULAR | Status: DC
  Administered 2021-04-26 – 2021-04-28 (×3): 40 mg via SUBCUTANEOUS
  Filled 2021-04-26 (×3): qty 0.4

## 2021-04-26 MED ORDER — SODIUM CHLORIDE 0.9 % IV BOLUS
1000.0000 mL | Freq: Once | INTRAVENOUS | Status: AC
  Administered 2021-04-26: 1000 mL via INTRAVENOUS

## 2021-04-26 MED ORDER — HYDROMORPHONE HCL 1 MG/ML IJ SOLN
0.5000 mg | INTRAMUSCULAR | Status: DC | PRN
Start: 2021-04-26 — End: 2021-04-26

## 2021-04-26 MED ORDER — ATORVASTATIN CALCIUM 40 MG PO TABS
80.0000 mg | ORAL_TABLET | Freq: Every day | ORAL | Status: DC
  Administered 2021-04-26 – 2021-04-28 (×3): 80 mg via ORAL
  Filled 2021-04-26 (×3): qty 2

## 2021-04-26 MED ORDER — ONDANSETRON HCL 4 MG/2ML IJ SOLN
4.0000 mg | Freq: Once | INTRAMUSCULAR | Status: AC
  Administered 2021-04-26: 4 mg via INTRAVENOUS
  Filled 2021-04-26: qty 2

## 2021-04-26 MED ORDER — PIPERACILLIN-TAZOBACTAM 3.375 G IVPB
3.3750 g | Freq: Once | INTRAVENOUS | Status: AC
  Administered 2021-04-26: 3.375 g via INTRAVENOUS
  Filled 2021-04-26: qty 50

## 2021-04-26 MED ORDER — SODIUM CHLORIDE 0.9 % IV SOLN
INTRAVENOUS | Status: DC
Start: 2021-04-26 — End: 2021-04-27

## 2021-04-26 MED ORDER — HYDROMORPHONE HCL 1 MG/ML IJ SOLN
1.0000 mg | INTRAMUSCULAR | Status: DC | PRN
  Administered 2021-04-26 – 2021-04-27 (×2): 1 mg via INTRAVENOUS
  Filled 2021-04-26 (×3): qty 1

## 2021-04-26 MED ORDER — INSULIN ASPART 100 UNIT/ML IJ SOLN
0.0000 [IU] | INTRAMUSCULAR | Status: DC
  Administered 2021-04-26 – 2021-04-27 (×4): 1 [IU] via SUBCUTANEOUS
  Administered 2021-04-27: 2 [IU] via SUBCUTANEOUS
  Administered 2021-04-27: 1 [IU] via SUBCUTANEOUS

## 2021-04-26 MED ORDER — LABETALOL HCL 5 MG/ML IV SOLN
10.0000 mg | INTRAVENOUS | Status: DC | PRN
Start: 2021-04-26 — End: 2021-04-28
  Administered 2021-04-27: 10 mg via INTRAVENOUS
  Filled 2021-04-26: qty 4

## 2021-04-26 MED ORDER — PIPERACILLIN-TAZOBACTAM 3.375 G IVPB
3.3750 g | Freq: Three times a day (TID) | INTRAVENOUS | Status: DC
  Administered 2021-04-26 – 2021-04-28 (×7): 3.375 g via INTRAVENOUS
  Filled 2021-04-26 (×7): qty 50

## 2021-04-26 MED ORDER — TAMSULOSIN HCL 0.4 MG PO CAPS
0.4000 mg | ORAL_CAPSULE | Freq: Every day | ORAL | Status: DC
Start: 2021-04-26 — End: 2021-04-28
  Administered 2021-04-26 – 2021-04-27 (×2): 0.4 mg via ORAL
  Filled 2021-04-26 (×2): qty 1

## 2021-04-26 MED ORDER — IOHEXOL 300 MG/ML  SOLN
100.0000 mL | Freq: Once | INTRAMUSCULAR | Status: AC | PRN
  Administered 2021-04-26: 100 mL via INTRAVENOUS

## 2021-04-26 MED ORDER — LISINOPRIL 10 MG PO TABS
40.0000 mg | ORAL_TABLET | Freq: Every day | ORAL | Status: DC
  Administered 2021-04-26 – 2021-04-28 (×3): 40 mg via ORAL
  Filled 2021-04-26 (×3): qty 4

## 2021-04-26 MED ORDER — MORPHINE SULFATE (PF) 4 MG/ML IV SOLN
4.0000 mg | Freq: Once | INTRAVENOUS | Status: AC
  Administered 2021-04-26: 4 mg via INTRAVENOUS
  Filled 2021-04-26: qty 1

## 2021-04-26 MED ORDER — AMLODIPINE BESYLATE 5 MG PO TABS
10.0000 mg | ORAL_TABLET | Freq: Every day | ORAL | Status: DC
  Administered 2021-04-26 – 2021-04-28 (×3): 10 mg via ORAL
  Filled 2021-04-26 (×3): qty 2

## 2021-04-26 NOTE — ED Triage Notes (Signed)
Constipation for 2 days. Says what comes out "doesn't amount to much" Thought it would go away . Hasnt taken anything

## 2021-04-26 NOTE — Assessment & Plan Note (Signed)
CT abdomen and pelvis with contrast showed multiple renal lesions Follow-up nonemergent abdominal MRI with and without IV gadolinium is recommended in the near future after resolution of the patient's acute illness to definitively characterize this finding and exclude neoplasm.

## 2021-04-26 NOTE — Progress Notes (Addendum)
PROGRESS NOTE     Blake Rodriguez, is a 72 y.o. male, DOB - [birth year], WUJ:[id number]  Admit date - 04/26/2021   Admitting Physician Bernadette Hoit, DO  Outpatient Primary MD for the patient is [facility] [place]  LOS - 0  Chief Complaint  Patient presents with   Constipation        Brief Narrative:  71 y.o. male with medical history significant for hypertension, hyperlipidemia, T2DM admitted on 04/26/2021 with sepsis secondary to perforated appendicitis- -Now requiring IV antibiotics IV pain medications and IV fluids     -Assessment and Plan: -Sepsis secondary to perforated appendicitis- POA -Lactic acidosis resolved with IV fluids -Leukocytosis, tachycardia and tachypnea noted -Continue IV Zosyn -Continue IV fluids while n.p.o. -IV Dilaudid as needed -IV Zofran as needed -General surgery consult from Dr. Constance Haw appreciated  -HTN--elevated BP noted most likely due to pain, restart amlodipine 10 mg daily - hold lisinopril due to AKI   may use IV labetalol when necessary  Every 4 hours for systolic blood pressure over 160 mmhg  HLD--continue Lipitor  DM2-check A1c Hold metformin and glipizide while n.p.o. Use Novolog/Humalog Sliding scale insulin with Accu-Cheks/Fingersticks as ordered   Kidney lesion CT abdomen and pelvis with contrast showed multiple renal lesions Follow-up nonemergent abdominal MRI with and without IV gadolinium is recommended in the near future after resolution of the patient's acute illness to definitively characterize this finding and exclude neoplasm. -Repeat abdominal imaging as outpatient advised  AKI (acute kidney injury) ([place]) BUN/creatinine 21/1.59 (no recent creatinine for comparison).   -Suspect dehydration related -Continue IV fluids while n.p.o. -Hold lisinopril Renally adjust medications, avoid nephrotoxic agents/dehydration/hypotension  -Total care time is over 43 minutes  Disposition/Need for in-Hospital Stay- patient  unable to be discharged at this time due to --sepsis secondary to perforated appendectomy requiring IV fluids and IV antibiotics pending return of bowel function and tolerance of oral intake*  Status is: Inpatient   Disposition: The patient is from: Home              Anticipated d/c is to: Home              Anticipated d/c date is: 2 days              Patient currently is not medically stable to d/c. Barriers: Not Clinically Stable-   Code Status :  -  Code Status: Full Code   Family Communication:    (patient is alert, awake and coherent)  Discussed with wife   DVT Prophylaxis  :   - SCDs  enoxaparin (LOVENOX) injection 40 mg Start: 04/26/21 1000 SCDs Start: 04/26/21 0802   Lab Results  Component Value Date   PLT 209 04/26/2021    Inpatient Medications  Scheduled Meds:  amLODipine  10 mg Oral Daily   atorvastatin  80 mg Oral Daily   enoxaparin (LOVENOX) injection  40 mg Subcutaneous Q24H   insulin aspart  0-9 Units Subcutaneous Q4H   lisinopril  40 mg Oral Daily   Continuous Infusions:  sodium chloride 150 mL/hr at 04/26/21 0959   piperacillin-tazobactam (ZOSYN)  IV     PRN Meds:.HYDROmorphone (DILAUDID) injection, labetalol   Anti-infectives (From admission, onward)    Start     Dose/Rate Route Frequency Ordered Stop   04/26/21 1400  piperacillin-tazobactam (ZOSYN) IVPB 3.375 g        3.375 g 12.5 mL/hr over 240 Minutes Intravenous Every 8 hours 04/26/21 0728     04/26/21 0530  piperacillin-tazobactam (ZOSYN) IVPB 3.375 g        3.375 g 12.5 mL/hr over 240 Minutes Intravenous  Once 04/26/21 0528 04/26/21 0945         Subjective: Blake Rodriguez today has no fevers,   No chest pain,   -Nausea with no emesis,  abdominal pain persist -Wife at bedside, questions answered   Objective: Vitals:   04/26/21 0430 04/26/21 0530 04/26/21 0800 04/26/21 0947  BP: (!) 161/87 (!) 149/88 (!) 156/77 (!) 155/82  Pulse: (!) 118 (!) 118 (!) 117 (!) 116  Resp: (!) 28 (!) 22  19   Temp:      TempSrc:      SpO2: 96% 97% 95% 98%  Weight:      Height:        Intake/Output Summary (Last 24 hours) at 04/26/2021 1050 Last data filed at 04/26/2021 0546 Gross per 24 hour  Intake 1000 ml  Output --  Net 1000 ml   Filed Weights   04/26/21 0050  Weight: 77.1 kg    Physical Exam  Gen:- Awake Alert,  in no apparent distress  HEENT:- [place].AT, No sclera icterus Neck-Supple Neck,No JVD,.  Lungs-  CTAB , fair symmetrical air movement CV- S1, S2 normal, regular  Abd-  +ve B.Sounds, Abd Soft, generalized lower abdominal tenderness with slight rebound and  guarding    Extremity/Skin:- No  edema, pedal pulses present  Psych-affect is appropriate, oriented x3 Neuro-no new focal deficits, no tremors  Data Reviewed: I have personally reviewed following labs and imaging studies  CBC: Recent Labs  Lab 04/26/21 0359  WBC 13.9*  NEUTROABS 12.6*  HGB 16.1  HCT 51.7  MCV 93.2  PLT 329   Basic Metabolic Panel: Recent Labs  Lab 04/26/21 0359  NA 140  K 3.8  CL 107  CO2 16*  GLUCOSE 176*  BUN 21  CREATININE 1.59*  CALCIUM 9.1   GFR: Estimated Creatinine Clearance: 43.8 mL/min (A) (by C-G formula based on SCr of 1.59 mg/dL (H)). Liver Function Tests: Recent Labs  Lab 04/26/21 0359  AST 20  ALT 17  ALKPHOS 65  BILITOT 1.7*  PROT 8.1  ALBUMIN 4.1   Cardiac Enzymes: No results for input(s): CKTOTAL, CKMB, CKMBINDEX, TROPONINI in the last 168 hours. BNP (last 3 results) No results for input(s): PROBNP in the last 8760 hours. HbA1C: No results for input(s): HGBA1C in the last 72 hours. Sepsis Labs: @LABRCNTIP (procalcitonin:4,lacticidven:4) ) Recent Results (from the past 240 hour(s))  Resp Panel by RT-PCR (Flu A&B, Covid) Nasopharyngeal Swab     Status: None   Collection Time: 04/26/21  5:51 AM   Specimen: Nasopharyngeal Swab; Nasopharyngeal(NP) swabs in vial transport medium  Result Value Ref Range Status   SARS Coronavirus 2 by RT PCR NEGATIVE  NEGATIVE Final    Comment: (NOTE) SARS-CoV-2 target nucleic acids are NOT DETECTED.  The SARS-CoV-2 RNA is generally detectable in upper respiratory specimens during the acute phase of infection. The lowest concentration of SARS-CoV-2 viral copies this assay can detect is 138 copies/mL. A negative result does not preclude SARS-Cov-2 infection and should not be used as the sole basis for treatment or other patient management decisions. A negative result may occur with  improper specimen collection/handling, submission of specimen other than nasopharyngeal swab, presence of viral mutation(s) within the areas targeted by this assay, and inadequate number of viral copies(<138 copies/mL). A negative result must be combined with clinical observations, patient history, and epidemiological information. The expected result is Negative.  Fact Sheet for Patients:  EntrepreneurPulse.com.au  Fact Sheet for Healthcare Providers:  IncredibleEmployment.be  This test is no t yet approved or cleared by the [place] FDA and  has been authorized for detection and/or diagnosis of SARS-CoV-2 by FDA under an Emergency Use Authorization (EUA). This EUA will remain  in effect (meaning this test can be used) for the duration of the COVID-19 declaration under Section 564(b)(1) of the Act, 21 U.S.C.section 360bbb-3(b)(1), unless the authorization is terminated  or revoked sooner.       Influenza A by PCR NEGATIVE NEGATIVE Final   Influenza B by PCR NEGATIVE NEGATIVE Final    Comment: (NOTE) The Xpert Xpress SARS-CoV-2/FLU/RSV plus assay is intended as an aid in the diagnosis of influenza from Nasopharyngeal swab specimens and should not be used as a sole basis for treatment. Nasal washings and aspirates are unacceptable for Xpert Xpress SARS-CoV-2/FLU/RSV testing.  Fact Sheet for Patients: EntrepreneurPulse.com.au  Fact Sheet for Healthcare  Providers: IncredibleEmployment.be  This test is not yet approved or cleared by the [place] FDA and has been authorized for detection and/or diagnosis of SARS-CoV-2 by FDA under an Emergency Use Authorization (EUA). This EUA will remain in effect (meaning this test can be used) for the duration of the COVID-19 declaration under Section 564(b)(1) of the Act, 21 U.S.C. section 360bbb-3(b)(1), unless the authorization is terminated or revoked.  Performed at [facility], [address], [place], [place] [address]       Radiology Studies: CT ABDOMEN PELVIS W CONTRAST  Result Date: 04/26/2021 CLINICAL DATA:  71 year old male with history of acute onset of nonlocalized abdominal pain. Nausea and vomiting. EXAM: CT ABDOMEN AND PELVIS WITH CONTRAST TECHNIQUE: Multidetector CT imaging of the abdomen and pelvis was performed using the standard protocol following bolus administration of intravenous contrast. RADIATION DOSE REDUCTION: This exam was performed according to the departmental dose-optimization program which includes automated exposure control, adjustment of the mA and/or kV according to patient size and/or use of iterative reconstruction technique. CONTRAST:  130mL OMNIPAQUE IOHEXOL 300 MG/ML  SOLN COMPARISON:  No priors. FINDINGS: Lower chest: Elevation of the left hemidiaphragm. Mild scarring in the lung bases. Hepatobiliary: In segment 6 of the liver (axial image 27 of series 2) there is a 1.9 x 1.5 cm well-defined low-attenuation lesion compatible with a simple cyst. No other suspicious appearing hepatic lesions are noted. No intra or extrahepatic biliary ductal dilatation. Gallbladder is normal in appearance. Pancreas: No pancreatic mass. No pancreatic ductal dilatation. No pancreatic or peripancreatic fluid collections or inflammatory changes. Spleen: Unremarkable. Adrenals/Urinary Tract: Multiple low-attenuation lesions in both kidneys, compatible with simple cysts,  largest of which is exophytic in the upper pole of the right kidney measuring up to 3.9 cm in diameter. Other lesions are either too small to definitively characterize or are considered indeterminate. The best example of this is a lesion in the interpolar region of the right kidney (axial image 25 of series 2) measuring 2.0 x 2.1 cm which is intermediate attenuation (29 HU) and slightly irregular in shape. No hydroureteronephrosis. Urinary bladder is normal in appearance. Bilateral adrenal glands are normal in appearance. Stomach/Bowel: The appearance of the stomach is normal. There is no pathologic dilatation of small bowel or colon. The appendix is dilated and inflamed, compatible with an acute appendicitis. Appendix: Location: Posterior and medial to the cecum Diameter: 12 mm Appendicolith: None Mucosal hyper-enhancement: Present Extraluminal gas: Trace amount Periappendiceal collection: Small amount of fluid in the periappendiceal region and in the  low anatomic pelvis. At this time, this appears non organized. Vascular/Lymphatic: Aortic atherosclerosis with fusiform ectasia of the infrarenal abdominal aorta which measures up to 2.7 x 2.6 cm. No lymphadenopathy noted in the abdomen or pelvis. Reproductive: Prostate gland and seminal vesicles are unremarkable in appearance. Other: Small volume of ascites and trace volume of pneumoperitoneum, most evident in the low anatomic pelvis around the region of the inflamed appendix. Musculoskeletal: There are no aggressive appearing lytic or blastic lesions noted in the visualized portions of the skeleton. IMPRESSION: 1. Acute perforated appendicitis, as above. Surgical consultation is strongly recommended. 2. Multiple renal lesions. The majority of these appear to represent simple cysts. There is one lesion in the interpolar region of the right kidney which is considered indeterminate (favored to be a mildly proteinaceous cyst). Follow-up nonemergent abdominal MRI with  and without IV gadolinium is recommended in the near future after resolution of the patient's acute illness to definitively characterize this finding and exclude neoplasm. 3. Aortic atherosclerosis with fusiform ectasia of the infrarenal abdominal aorta which measures up to 2.7 x 2.6 cm. 4. Additional incidental findings, as above. Critical Value/emergent results were called by telephone at the time of interpretation on 04/26/2021 at 5:28 am to provider Veryl Speak, who verbally acknowledged these results. Electronically Signed   By: Vinnie Langton M.D.   On: 04/26/2021 05:28     Scheduled Meds:  amLODipine  10 mg Oral Daily   atorvastatin  80 mg Oral Daily   enoxaparin (LOVENOX) injection  40 mg Subcutaneous Q24H   insulin aspart  0-9 Units Subcutaneous Q4H   lisinopril  40 mg Oral Daily   Continuous Infusions:  sodium chloride 150 mL/hr at 04/26/21 0959   piperacillin-tazobactam (ZOSYN)  IV       LOS: 0 days   Roxan Hockey M.D on 04/26/2021 at 10:50 AM  Go to [email or web address] - for contact info  [facility] - Office  [phone]  If 7PM-7AM, please contact night-coverage [email or web address] Password [email or web address] 04/26/2021, 10:50 AM

## 2021-04-26 NOTE — Progress Notes (Signed)
°   04/26/21 0947  Assess: MEWS Score  Temp 98 F (36.7 C)  BP (!) 155/82  Pulse Rate (!) 116  Resp 19  SpO2 98 %  O2 Device Room Air  Assess: MEWS Score  MEWS Temp 0  MEWS Systolic 0  MEWS Pulse 2  MEWS RR 0  MEWS LOC 0  MEWS Score 2  MEWS Score Color Yellow  Assess: if the MEWS score is Yellow or Red  Were vital signs taken at a resting state? Yes  Focused Assessment No change from prior assessment  Early Detection of Sepsis Score *See Row Information* High  MEWS guidelines implemented *See Row Information* Yes  Treat  MEWS Interventions Administered scheduled meds/treatments  Pain Scale 0-10  Pain Score 5  Pain Type Acute pain  Pain Location Abdomen  Pain Frequency Intermittent  Patients Stated Pain Goal 0  Pain Intervention(s) Rest  Take Vital Signs  Increase Vital Sign Frequency  Yellow: Q 2hr X 2 then Q 4hr X 2, if remains yellow, continue Q 4hrs  Escalate  MEWS: Escalate Yellow: discuss with charge nurse/RN and consider discussing with provider and RRT  Notify: Charge Nurse/RN  Name of Charge Nurse/RN Notified Apolonio Schneiders, RN  Date Charge Nurse/RN Notified 04/26/21  Time Charge Nurse/RN Notified 1751  Notify: Provider  Provider Name/Title Dr. Joesph Fillers  Date Provider Notified 04/26/21  Time Provider Notified [phone]  Notification Type Page  Provider response No new orders  Date of Provider Response 04/26/21  Time of Provider Response [phone]  Document  Patient Outcome Stabilized after interventions  Progress note created (see row info) Yes

## 2021-04-26 NOTE — Assessment & Plan Note (Signed)
Continue IV hydration ?

## 2021-04-26 NOTE — Assessment & Plan Note (Addendum)
Sepsis secondary to acute perforated appendicitis Patient met sepsis criteria due to elevated WBC, tachycardia, tachypnea and abdomen as source of infection Patient was able to maintain normal good blood pressure Continue IV hydration Continue IV Zosyn  Continue IV Dilaudid as needed Lactic acid pending General surgery was already consulted and will see patient this morning.  We shall await further recommendations

## 2021-04-26 NOTE — Assessment & Plan Note (Signed)
Continue management as described for acute perforated appendicitis

## 2021-04-26 NOTE — Progress Notes (Signed)
Pharmacy Antibiotic Note  Blake Rodriguez is a 71 y.o. male admitted on 04/26/2021 with IAI.  Pharmacy has been consulted for Zosyn dosing.  Plan: Zosyn 3.375g IV q8h (4 hour infusion).  Height: 5\' 9"  (175.3 cm) Weight: 77.1 kg (169 lb 15.6 oz) IBW/kg (Calculated) : 70.7  Temp (24hrs), Avg:97.7 F (36.5 C), Min:97.7 F (36.5 C), Max:97.7 F (36.5 C)  Recent Labs  Lab 04/26/21 0359  WBC 13.9*  CREATININE 1.59*    Estimated Creatinine Clearance: 43.8 mL/min (A) (by C-G formula based on SCr of 1.59 mg/dL (H)).    No Known Allergies  Antimicrobials this admission: Zosyn 2/25 >>    Dose adjustments this admission:   Microbiology results:  BCx: pending  UCx:    Sputum:    MRSA PCR:   Thank you for allowing pharmacy to be a part of this patients care.  Hart Robinsons A 04/26/2021 7:29 AM

## 2021-04-26 NOTE — Assessment & Plan Note (Addendum)
Continue ISS and hypoglycemia protocol Metformin and glipizide will be held at this time

## 2021-04-26 NOTE — H&P (Signed)
°History and Physical  ° ° °Patient: Blake Rodriguez MRN:[id number] DOB: 02/19/1951 °DOA: 04/26/2021 °DOS: the patient was seen and examined on 04/26/2021 °PCP: [facility]  °Patient coming from: Home ° °Chief Complaint:  °Chief Complaint  °Patient presents with  ° Constipation  ° ° °HPI: Blake Rodriguez is a 71 y.o. male with medical history significant for hypertension, hyperlipidemia, T2DM who presents to the emergency department accompanied by daughter due to 1 day onset of abdominal pain.  Abdominal pain was in RLQ and suprapubic area and was rated as 10/10 on pain scale, this was associated with nausea and nonbloody vomiting.  It was initially thought that this was due to constipation, since he feels like he needed to have a bowel movement but was unable, so he decided to go to the ED for further evaluation and management.  Patient denies fever, chills, chest pain, shortness of breath, or any prior abdominal surgery. ° °ED course: °In the emergency department, he was tachypneic and tachycardic, BP was 146/67.  Work-up in ED showed anion gap metabolic acidosis, CBG 176, BUN/creatinine 21/1.59 (no recent creatinine for comparison).  Lipase 34 °CT abdomen and pelvis with contrast showed acute perforated appendicitis °Patient was treated with IV Zosyn, IV hydration was provided, morphine was given due to abdominal pain and Zofran due to nausea and vomiting.  General surgery was consulted and will see patient this morning per ED physician.  Hospitalist was asked to admit patient for further evaluation and management. ° ° ° °Review of Systems: As mentioned in the history of present illness. All other systems reviewed and are negative. °Past Medical History:  °Diagnosis Date  ° Diabetes mellitus without complication (HCC)   ° Hypertension   ° Lung collapse   ° °Past Surgical History:  °Procedure Laterality Date  ° SHOULDER ARTHROCENTESIS    ° °Social History:  reports that he has quit smoking. He has never  used smokeless tobacco. He reports current alcohol use. He reports that he does not use drugs. ° °No Known Allergies ° °History reviewed. No pertinent family history. ° °Prior to Admission medications   °Medication Sig Start Date End Date Taking? Authorizing Provider  °amLODipine (NORVASC) 10 MG tablet Take 10 mg by mouth daily.    [provider]  °aspirin EC 81 MG tablet Take 81 mg by mouth daily.    [provider]  °atorvastatin (LIPITOR) 80 MG tablet  08/19/17   [provider]  °glipiZIDE (GLUCOTROL) 5 MG tablet Take 5 mg by mouth 2 (two) times daily before a meal.    [provider]  °lisinopril (PRINIVIL,ZESTRIL) 40 MG tablet  08/03/17   [provider]  °metFORMIN (GLUCOPHAGE-XR) 500 MG 24 hr tablet  08/23/17   [provider]  ° ° °Physical Exam: °Vitals:  ° 04/26/21 0300 04/26/21 0330 04/26/21 0430 04/26/21 0530  °BP: (!) 163/83 (!) 171/79 (!) 161/87 (!) 149/88  °Pulse: (!) 120 (!) 117 (!) 118 (!) 118  °Resp: 18 (!) 22 (!) 28 (!) 22  °Temp:      °TempSrc:      °SpO2: 97% 96% 96% 97%  °Weight:      °Height:      ° °General: Patient was awake and alert and oriented x3. Ill appearing but not in any acute distress.  °HEENT: NCAT.  PERRLA. EOMI. Sclerae anicteric.  Dry mucosal membranes. °Neck: Neck supple without lymphadenopathy. No carotid bruits. No masses palpated.  °Cardiovascular: Tachycardia.  Regular rate with   normal S1-S2 sounds. No murmurs, rubs or gallops auscultated. No JVD.  °Respiratory: Tachypnea.  Clear breath sounds.  No accessory muscle use. °Abdomen: Soft, tender to palpation of RLQ and suprapubic area.  Positive Rovsing sign.  Active bowel sounds.  °Skin: No rashes, lesions, or ulcerations.  Dry, warm to touch. °Musculoskeletal:  2+ dorsalis pedis and radial pulses. Good ROM.  No contractures  °Psychiatric: Intact judgment and insight.  Mood appropriate to current condition. °Neurologic: No focal neurological deficits. Strength is 5/5 x 4.   CN II - XII grossly intact. ° ° °Data Reviewed: °EKG personally reviewed showed sinus tachycardia at a rate of 119 bpm ° °Assessment and Plan: °* Acute perforated appendicitis- (present on admission) °Sepsis secondary to acute perforated appendicitis °Patient met sepsis criteria due to elevated WBC, tachycardia, tachypnea and abdomen as source of infection °Patient was able to maintain normal good blood pressure °Continue IV hydration °Continue IV Zosyn  °Continue IV Dilaudid as needed °Lactic acid pending °General surgery was already consulted and will see patient this morning.  We shall await further recommendations ° °Sepsis (HCC) °Continue management as described for acute perforated appendicitis ° °High anion gap metabolic acidosis °This is possibly secondary to patient's perforated appendix/dehydration °Continue IV hydration and continue to monitor for anion gap closure °Continue IV antibiotics ° °Abdominal pain °Continue management as described for acute perforated appendicitis ° °AKI (acute kidney injury) (HCC) °BUN/creatinine 21/1.59 (no recent creatinine for comparison).   °Continue IV hydration °Renally adjust medications, avoid nephrotoxic agents/dehydration/hypotension ° ° °Dehydration °Continue IV hydration ° °Hyperlipidemia °Continue Lipitor ° °Essential hypertension °Continue amlodipine ° °Kidney lesion °CT abdomen and pelvis with contrast showed multiple renal lesions °Follow-up nonemergent abdominal MRI with and without IV gadolinium is recommended in the near future after resolution of the patient's acute illness to definitively characterize this finding and exclude neoplasm. ° °Type 2 diabetes mellitus with hyperglycemia (HCC) °Continue ISS and hypoglycemia protocol ° ° °Advance Care Planning: CODE STATUS: Full code ° °Consults: General surgery ° °Family Communication: Daughter at bedside (all questions answered to satisfaction) ° °Severity of Illness: °The appropriate patient status for this  patient is INPATIENT. Inpatient status is judged to be reasonable and necessary in order to provide the required intensity of service to ensure the patient's safety. The patient's presenting symptoms, physical exam findings, and initial radiographic and laboratory data in the context of their chronic comorbidities is felt to place them at high risk for further clinical deterioration. Furthermore, it is not anticipated that the patient will be medically stable for discharge from the hospital within 2 midnights of admission.  ° °* I certify that at the point of admission it is my clinical judgment that the patient will require inpatient hospital care spanning beyond 2 midnights from the point of admission due to high intensity of service, high risk for further deterioration and high frequency of surveillance required.* ° °Author: °Oladapo Adefeso, DO °04/26/2021 7:19 AM ° °For on call review [email or web address].  °

## 2021-04-26 NOTE — Assessment & Plan Note (Signed)
BUN/creatinine 21/1.59 (no recent creatinine for comparison).   Continue IV hydration Renally adjust medications, avoid nephrotoxic agents/dehydration/hypotension

## 2021-04-26 NOTE — Consult Note (Signed)
[facility] Consult  Reason for Consult: Acute perforated appendicitis  Referring Physician:  Dr. Stark Jock, Dr. Denton Brick   Chief Complaint   Constipation     HPI: Blake Rodriguez is a 71 y.o. male with acute perforated appendicitis on CT scan. He came in with lower abdominal pain and associated nausea and vomiting for the past 24 hours. He had pain that was 10/10 and was most in the suprapubic area. He had no fevers or chills. He was evaluated in the ED and CT demonstrated a perforated appendicitis.   The ED called and I recommended Iv antibiotic and admission with the hospitalist.   Past Medical History:  Diagnosis Date   Diabetes mellitus without complication ([place])    Hypertension    Lung collapse     Past Surgical History:  Procedure Laterality Date   SHOULDER ARTHROCENTESIS      History reviewed. No pertinent family history.  Social History   Tobacco Use   Smoking status: Former   Smokeless tobacco: Never  Scientific laboratory technician Use: Never used  Substance Use Topics   Alcohol use: Yes    Comment: occassional   Drug use: No    Medications: I have reviewed the patient's current medications. Prior to Admission:  Medications Prior to Admission  Medication Sig Dispense Refill Last Dose   amLODipine (NORVASC) 10 MG tablet Take 10 mg by mouth daily.   04/25/2021   aspirin EC 81 MG tablet Take 81 mg by mouth daily.   04/25/2021   atorvastatin (LIPITOR) 80 MG tablet Take 80 mg by mouth daily.   04/24/2021   empagliflozin (JARDIANCE) 25 MG TABS tablet Take 1 tablet by mouth daily.   04/25/2021   glipiZIDE (GLUCOTROL) 5 MG tablet Take 5 mg by mouth 2 (two) times daily before a meal.   04/25/2021   lisinopril (PRINIVIL,ZESTRIL) 40 MG tablet Take 40 mg by mouth daily.   04/25/2021   metFORMIN (GLUCOPHAGE-XR) 500 MG 24 hr tablet Take 500 mg by mouth daily.   04/24/2021   Scheduled:  amLODipine  10 mg Oral Daily   atorvastatin  80 mg Oral Daily   enoxaparin (LOVENOX)  injection  40 mg Subcutaneous Q24H   insulin aspart  0-9 Units Subcutaneous Q4H   lisinopril  40 mg Oral Daily   Continuous:  sodium chloride 150 mL/hr at 04/26/21 0959   piperacillin-tazobactam (ZOSYN)  IV     ZWC:HENIDPOEUMPNT (DILAUDID) injection, labetalol  No Known Allergies   ROS:  A comprehensive review of systems was negative except for: Gastrointestinal: positive for abdominal pain, nausea, and vomiting  Blood pressure (!) 155/82, pulse (!) 116, temperature 97.7 F (36.5 C), temperature source Oral, resp. rate 19, height 5\' 9"  (1.753 m), weight 77.1 kg, SpO2 98 %. Physical Exam Vitals reviewed.  Constitutional:      Appearance: Normal appearance.  HENT:     Head: Normocephalic.     Nose: Nose normal.  Eyes:     Pupils: Pupils are equal, round, and reactive to light.  Pulmonary:     Effort: Pulmonary effort is normal.  Abdominal:     General: There is distension.     Palpations: Abdomen is soft.     Tenderness: There is abdominal tenderness in the right lower quadrant and suprapubic area.  Musculoskeletal:        General: No swelling.     Cervical back: Normal range of motion.  Skin:    General: Skin is warm.  Neurological:  General: No focal deficit present.     Mental Status: He is alert.  Psychiatric:        Mood and Affect: Mood normal.    Results: Results for orders placed or performed during the hospital encounter of 04/26/21 (from the past 48 hour(s))  Comprehensive metabolic panel     Status: Abnormal   Collection Time: 04/26/21  3:59 AM  Result Value Ref Range   Sodium 140 135 - 145 mmol/L   Potassium 3.8 3.5 - 5.1 mmol/L   Chloride 107 98 - 111 mmol/L   CO2 16 (L) 22 - 32 mmol/L   Glucose, Bld 176 (H) 70 - 99 mg/dL    Comment: Glucose reference range applies only to samples taken after fasting for at least 8 hours.   BUN 21 8 - 23 mg/dL   Creatinine, Ser 1.59 (H) 0.61 - 1.24 mg/dL   Calcium 9.1 8.9 - 10.3 mg/dL   Total Protein 8.1 6.5 -  8.1 g/dL   Albumin 4.1 3.5 - 5.0 g/dL   AST 20 15 - 41 U/L   ALT 17 0 - 44 U/L   Alkaline Phosphatase 65 38 - 126 U/L   Total Bilirubin 1.7 (H) 0.3 - 1.2 mg/dL   GFR, Estimated 47 (L) >60 mL/min    Comment: (NOTE) Calculated using the CKD-EPI Creatinine Equation (2021)    Anion gap 17 (H) 5 - 15    Comment: Performed at [facility], [address] [place], [place] [address]  Lipase, blood     Status: None   Collection Time: 04/26/21  3:59 AM  Result Value Ref Range   Lipase 34 11 - 51 U/L    Comment: Performed at [facility], [address], [place], [place] [address]  CBC with Differential     Status: Abnormal   Collection Time: 04/26/21  3:59 AM  Result Value Ref Range   WBC 13.9 (H) 4.0 - 10.5 K/uL   RBC 5.55 4.22 - 5.81 MIL/uL   Hemoglobin 16.1 13.0 - 17.0 g/dL   HCT 51.7 39.0 - 52.0 %   MCV 93.2 80.0 - 100.0 fL   MCH 29.0 26.0 - 34.0 pg   MCHC 31.1 30.0 - 36.0 g/dL   RDW 14.5 11.5 - 15.5 %   Platelets 209 150 - 400 K/uL   nRBC 0.0 0.0 - 0.2 %   Neutrophils Relative % 91 %   Neutro Abs 12.6 (H) 1.7 - 7.7 K/uL   Lymphocytes Relative 3 %   Lymphs Abs 0.4 (L) 0.7 - 4.0 K/uL   Monocytes Relative 4 %   Monocytes Absolute 0.6 0.1 - 1.0 K/uL   Eosinophils Relative 2 %   Eosinophils Absolute 0.2 0.0 - 0.5 K/uL   Basophils Relative 0 %   Basophils Absolute 0.0 0.0 - 0.1 K/uL   Immature Granulocytes 0 %   Abs Immature Granulocytes 0.06 0.00 - 0.07 K/uL    Comment: Performed at [facility], [address] [place], [place] [address]  Resp Panel by RT-PCR (Flu A&B, Covid) Nasopharyngeal Swab     Status: None   Collection Time: 04/26/21  5:51 AM   Specimen: Nasopharyngeal Swab; Nasopharyngeal(NP) swabs in vial transport medium  Result Value Ref Range   SARS Coronavirus 2 by RT PCR NEGATIVE NEGATIVE    Comment: (NOTE) SARS-CoV-2 target nucleic acids are NOT DETECTED.  The SARS-CoV-2 RNA is generally detectable in upper respiratory specimens during the acute phase of  infection. The lowest concentration of  SARS-CoV-2 viral copies this assay can detect is 138 copies/mL. A negative result does not preclude SARS-Cov-2 infection and should not be used as the sole basis for treatment or other patient management decisions. A negative result may occur with  improper specimen collection/handling, submission of specimen other than nasopharyngeal swab, presence of viral mutation(s) within the areas targeted by this assay, and inadequate number of viral copies(<138 copies/mL). A negative result must be combined with clinical observations, patient history, and epidemiological information. The expected result is Negative.  Fact Sheet for Patients:  EntrepreneurPulse.com.au  Fact Sheet for Healthcare Providers:  IncredibleEmployment.be  This test is no t yet approved or cleared by the [place] FDA and  has been authorized for detection and/or diagnosis of SARS-CoV-2 by FDA under an Emergency Use Authorization (EUA). This EUA will remain  in effect (meaning this test can be used) for the duration of the COVID-19 declaration under Section 564(b)(1) of the Act, 21 U.S.C.section 360bbb-3(b)(1), unless the authorization is terminated  or revoked sooner.       Influenza A by PCR NEGATIVE NEGATIVE   Influenza B by PCR NEGATIVE NEGATIVE    Comment: (NOTE) The Xpert Xpress SARS-CoV-2/FLU/RSV plus assay is intended as an aid in the diagnosis of influenza from Nasopharyngeal swab specimens and should not be used as a sole basis for treatment. Nasal washings and aspirates are unacceptable for Xpert Xpress SARS-CoV-2/FLU/RSV testing.  Fact Sheet for Patients: EntrepreneurPulse.com.au  Fact Sheet for Healthcare Providers: IncredibleEmployment.be  This test is not yet approved or cleared by the [place] FDA and has been authorized for detection and/or diagnosis of SARS-CoV-2 by FDA  under an Emergency Use Authorization (EUA). This EUA will remain in effect (meaning this test can be used) for the duration of the COVID-19 declaration under Section 564(b)(1) of the Act, 21 U.S.C. section 360bbb-3(b)(1), unless the authorization is terminated or revoked.  Performed at [facility], [address] [place], [place] [address]   Lactic acid, plasma     Status: Abnormal   Collection Time: 04/26/21  6:47 AM  Result Value Ref Range   Lactic Acid, Venous 2.6 (HH) 0.5 - 1.9 mmol/L    Comment: CRITICAL RESULT CALLED TO, READ BACK BY AND VERIFIED WITH: OAKLEY B @ 0729 ON [id number] BY HENDERSON L Performed at [facility], [address] [place], [place] [address]   Lactic acid, plasma     Status: None   Collection Time: 04/26/21  8:22 AM  Result Value Ref Range   Lactic Acid, Venous 1.5 0.5 - 1.9 mmol/L    Comment: Performed at [facility], [address], [place], [place] [address]  CBG monitoring, ED     Status: Abnormal   Collection Time: 04/26/21  8:25 AM  Result Value Ref Range   Glucose-Capillary 131 (H) 70 - 99 mg/dL    Comment: Glucose reference range applies only to samples taken after fasting for at least 8 hours.   Personally reviewed and reviewed with patient and family- dilated appendix with fluid, small foci of gas, appendicolith  CT ABDOMEN PELVIS W CONTRAST  Result Date: 04/26/2021 CLINICAL DATA:  71 year old male with history of acute onset of nonlocalized abdominal pain. Nausea and vomiting. EXAM: CT ABDOMEN AND PELVIS WITH CONTRAST TECHNIQUE: Multidetector CT imaging of the abdomen and pelvis was performed using the standard protocol following bolus administration of intravenous contrast. RADIATION DOSE REDUCTION: This exam was performed according to the departmental dose-optimization program which includes automated exposure control, adjustment of the  mA and/or kV according to patient size and/or use of iterative reconstruction technique. CONTRAST:  114mL  OMNIPAQUE IOHEXOL 300 MG/ML  SOLN COMPARISON:  No priors. FINDINGS: Lower chest: Elevation of the left hemidiaphragm. Mild scarring in the lung bases. Hepatobiliary: In segment 6 of the liver (axial image 27 of series 2) there is a 1.9 x 1.5 cm well-defined low-attenuation lesion compatible with a simple cyst. No other suspicious appearing hepatic lesions are noted. No intra or extrahepatic biliary ductal dilatation. Gallbladder is normal in appearance. Pancreas: No pancreatic mass. No pancreatic ductal dilatation. No pancreatic or peripancreatic fluid collections or inflammatory changes. Spleen: Unremarkable. Adrenals/Urinary Tract: Multiple low-attenuation lesions in both kidneys, compatible with simple cysts, largest of which is exophytic in the upper pole of the right kidney measuring up to 3.9 cm in diameter. Other lesions are either too small to definitively characterize or are considered indeterminate. The best example of this is a lesion in the interpolar region of the right kidney (axial image 25 of series 2) measuring 2.0 x 2.1 cm which is intermediate attenuation (29 HU) and slightly irregular in shape. No hydroureteronephrosis. Urinary bladder is normal in appearance. Bilateral adrenal glands are normal in appearance. Stomach/Bowel: The appearance of the stomach is normal. There is no pathologic dilatation of small bowel or colon. The appendix is dilated and inflamed, compatible with an acute appendicitis. Appendix: Location: Posterior and medial to the cecum Diameter: 12 mm Appendicolith: None Mucosal hyper-enhancement: Present Extraluminal gas: Trace amount Periappendiceal collection: Small amount of fluid in the periappendiceal region and in the low anatomic pelvis. At this time, this appears non organized. Vascular/Lymphatic: Aortic atherosclerosis with fusiform ectasia of the infrarenal abdominal aorta which measures up to 2.7 x 2.6 cm. No lymphadenopathy noted in the abdomen or pelvis.  Reproductive: Prostate gland and seminal vesicles are unremarkable in appearance. Other: Small volume of ascites and trace volume of pneumoperitoneum, most evident in the low anatomic pelvis around the region of the inflamed appendix. Musculoskeletal: There are no aggressive appearing lytic or blastic lesions noted in the visualized portions of the skeleton. IMPRESSION: 1. Acute perforated appendicitis, as above. Surgical consultation is strongly recommended. 2. Multiple renal lesions. The majority of these appear to represent simple cysts. There is one lesion in the interpolar region of the right kidney which is considered indeterminate (favored to be a mildly proteinaceous cyst). Follow-up nonemergent abdominal MRI with and without IV gadolinium is recommended in the near future after resolution of the patient's acute illness to definitively characterize this finding and exclude neoplasm. 3. Aortic atherosclerosis with fusiform ectasia of the infrarenal abdominal aorta which measures up to 2.7 x 2.6 cm. 4. Additional incidental findings, as above. Critical Value/emergent results were called by telephone at the time of interpretation on 04/26/2021 at 5:28 am to provider Veryl Speak, who verbally acknowledged these results. Electronically Signed   By: Vinnie Langton M.D.   On: 04/26/2021 05:28     Assessment & Plan:  Blake Rodriguez is a 71 y.o. male with acute perforated appendicitis. Plan for management with IV antibiotics. No IR drain needed at this time, Discussed that we will monitor and he may need IR drain if develops an abscess. Discussed potential for ileus given his nausea/vomiting and distention.   NPO, ice and sips ok with meds IV zosyn for perforated appendicitis Labs tomorrow Will monitor for ileus Will monitor for need to repeat CT pending abscess formation and IR drain need   All questions were answered to the  satisfaction of the patient and family. Updated team.   [name] 04/26/2021, 10:34 AM

## 2021-04-26 NOTE — ED Provider Notes (Signed)
[facility] Provider Note   CSN: [id number] Arrival date & time: 04/26/21  0034     History  Chief Complaint  Patient presents with   Constipation    Blake Rodriguez is a 71 y.o. male.  Patient is a 71 year old male with past medical history of hypertension, hyperlipidemia, diabetes.  Patient presenting today for evaluation of abdominal pain.  This started yesterday evening and has worsened throughout the day today.  He describes generalized abdominal cramping and has had multiple episodes of vomiting.  He states that he feels like he needs to have a bowel movement, but cannot.  He denies any fevers or chills.  He denies any bloody stool or vomit.  He denies a history of prior abdominal surgeries.  The history is provided by the patient.      Home Medications Prior to Admission medications   Medication Sig Start Date End Date Taking? Authorizing Provider  amLODipine (NORVASC) 10 MG tablet Take 10 mg by mouth daily.    [provider]  aspirin EC 81 MG tablet Take 81 mg by mouth daily.    [provider]  atorvastatin (LIPITOR) 80 MG tablet  08/19/17   [provider]  glipiZIDE (GLUCOTROL) 5 MG tablet Take 5 mg by mouth 2 (two) times daily before a meal.    [provider]  lisinopril (PRINIVIL,ZESTRIL) 40 MG tablet  08/03/17   [provider]  metFORMIN (GLUCOPHAGE-XR) 500 MG 24 hr tablet  08/23/17   [provider]      Allergies    Patient has no known allergies.    Review of Systems   Review of Systems  All other systems reviewed and are negative.  Physical Exam Updated Vital Signs BP (!) 146/67 (BP Location: Right Arm)    Pulse (!) 122    Temp 97.7 F (36.5 C) (Oral)    Resp 19    Ht 5\' 9"  (1.753 m)    Wt 77.1 kg    SpO2 97%    BMI 25.10 kg/m  Physical Exam Vitals and nursing note reviewed.  Constitutional:      General: He is not in acute distress.    Appearance: He is well-developed. He is  not diaphoretic.  HENT:     Head: Normocephalic and atraumatic.  Cardiovascular:     Rate and Rhythm: Normal rate and regular rhythm.     Heart sounds: No murmur heard.   No friction rub.  Pulmonary:     Effort: Pulmonary effort is normal. No respiratory distress.     Breath sounds: Normal breath sounds. No wheezing or rales.  Abdominal:     General: Bowel sounds are normal. There is no distension.     Palpations: Abdomen is soft.     Tenderness: There is abdominal tenderness. There is no right CVA tenderness, left CVA tenderness, guarding or rebound.     Comments: There is generalized abdominal tenderness  Musculoskeletal:        General: Normal range of motion.     Cervical back: Normal range of motion and neck supple.  Skin:    General: Skin is warm and dry.  Neurological:     Mental Status: He is alert and oriented to person, place, and time.     Coordination: Coordination normal.    ED Results / Procedures / Treatments   Labs (all labs ordered are listed, but only abnormal results are displayed) Labs Reviewed  COMPREHENSIVE METABOLIC PANEL  LIPASE,  BLOOD  CBC WITH DIFFERENTIAL/PLATELET    EKG EKG Interpretation  Date/Time:  Saturday April 26 2021 02:47:33 EST Ventricular Rate:  119 PR Interval:  145 QRS Duration: 86 QT Interval:  287 QTC Calculation: 404 R Axis:   39 Text Interpretation: Sinus tachycardia Right atrial enlargement Abnormal R-wave progression, early transition Abnormal T, consider ischemia, diffuse leads Confirmed by Veryl Speak [phone]) on 04/26/2021 5:36:22 AM  Radiology No results found.  Procedures Procedures    Medications Ordered in ED Medications  ondansetron (ZOFRAN) injection 4 mg (has no administration in time range)  sodium chloride 0.9 % bolus 1,000 mL (has no administration in time range)  morphine (PF) 4 MG/ML injection 4 mg (has no administration in time range)    ED Course/ Medical Decision Making/ A&P  This patient  presents to the ED for concern of constipation/abdominal pain, this involves an extensive number of treatment options, and is a complaint that carries with it a high risk of complications and morbidity.  The differential diagnosis includes constipation, fecal impaction, acute diverticulitis, small bowel obstruction, acute appendicitis, perforated viscus, AAA   Co morbidities that complicate the patient evaluation  None   Additional history obtained:  Additional history obtained from daughter at bedside No external records needed   Lab Tests:  I Ordered, and personally interpreted labs.  The pertinent results include: CBC shows leukocytosis with white count of 13.7.  Patient appears somewhat dehydrated with CO2 of 16.  Laboratory studies otherwise unremarkable   Imaging Studies ordered:  I ordered imaging studies including CT scan of the abdomen and pelvis I independently visualized and interpreted imaging which showed acute perforated appendicitis I agree with the radiologist interpretation   Cardiac Monitoring:  The patient was maintained on a cardiac monitor.  I personally viewed and interpreted the cardiac monitored which showed an underlying rhythm of: Sinus rhythm   Medicines ordered and prescription drug management:  I ordered medication including morphine for pain and Zofran for nausea Reevaluation of the patient after these medicines showed that the patient improved I have reviewed the patients home medicines and have made adjustments as needed   Test Considered:  No other test considered or indicated   Critical Interventions:  IV fluids and Zosyn   Consultations Obtained:  I requested consultation with the general surgeon, Dr. Constance Haw,  and discussed lab and imaging findings as well as pertinent plan - they recommend: Admission to the hospital for IV antibiotics.  I have also spoke with Dr. Josephine Cables who will admit.   Problem List / ED Course:  Patient  presenting with constipation and generalized abdominal pain.  He is tender in all 4 quadrants and my exam and presentation not consistent with constipation.  Further evaluation performed including laboratory studies and CT scan of the abdomen and pelvis.  This reveals a white count of 14,000 with CAT scan showing acute, perforated appendicitis.  This was discussed with the general surgeon and hospitalist.  Patient will be given IV antibiotics and admitted to the hospitalist service.   Reevaluation:  After the interventions noted above, I reevaluated the patient and found that they have : Improved   Social Determinants of Health:  None   Dispostion:  After consideration of the diagnostic results and the patients response to treatment, I feel that the patent would benefit from inpatient admission for IV antibiotics and surgical consultation.  CRITICAL CARE Performed by: Veryl Speak Total critical care time: 40 minutes Critical care time was exclusive of separately billable  procedures and treating other patients. Critical care was necessary to treat or prevent imminent or life-threatening deterioration. Critical care was time spent personally by me on the following activities: development of treatment plan with patient and/or surrogate as well as nursing, discussions with consultants, evaluation of patient's response to treatment, examination of patient, obtaining history from patient or surrogate, ordering and performing treatments and interventions, ordering and review of laboratory studies, ordering and review of radiographic studies, pulse oximetry and re-evaluation of patient's condition.     Final Clinical Impression(s) / ED Diagnoses Final diagnoses:  None    Rx / DC Orders ED Discharge Orders     None         Veryl Speak, MD 04/26/21 [phone]

## 2021-04-26 NOTE — ED Notes (Signed)
Date and time results received: 04/26/21 7:29 AM   Test: Lactic acid Critical Value: 2.6  Name of Provider Notified: DR. Bernadette Hoit  Orders Received? Or Actions Taken?: see orders

## 2021-04-26 NOTE — Assessment & Plan Note (Signed)
This is possibly secondary to patient's perforated appendix/dehydration Continue IV hydration and continue to monitor for anion gap closure Continue IV antibiotics

## 2021-04-26 NOTE — Assessment & Plan Note (Signed)
-  Continue Lipitor °

## 2021-04-26 NOTE — Assessment & Plan Note (Addendum)
-  Continue amlodipine and lisinopril 

## 2021-04-27 DIAGNOSIS — K3532 Acute appendicitis with perforation and localized peritonitis, without abscess: Secondary | ICD-10-CM | POA: Diagnosis not present

## 2021-04-27 LAB — GLUCOSE, CAPILLARY
Glucose-Capillary: 103 mg/dL — ABNORMAL HIGH (ref 70–99)
Glucose-Capillary: 118 mg/dL — ABNORMAL HIGH (ref 70–99)
Glucose-Capillary: 122 mg/dL — ABNORMAL HIGH (ref 70–99)
Glucose-Capillary: 133 mg/dL — ABNORMAL HIGH (ref 70–99)
Glucose-Capillary: 141 mg/dL — ABNORMAL HIGH (ref 70–99)
Glucose-Capillary: 152 mg/dL — ABNORMAL HIGH (ref 70–99)

## 2021-04-27 LAB — COMPREHENSIVE METABOLIC PANEL
ALT: 14 U/L (ref 0–44)
AST: 24 U/L (ref 15–41)
Albumin: 3.5 g/dL (ref 3.5–5.0)
Alkaline Phosphatase: 61 U/L (ref 38–126)
Anion gap: 13 (ref 5–15)
BUN: 23 mg/dL (ref 8–23)
CO2: 20 mmol/L — ABNORMAL LOW (ref 22–32)
Calcium: 8.7 mg/dL — ABNORMAL LOW (ref 8.9–10.3)
Chloride: 113 mmol/L — ABNORMAL HIGH (ref 98–111)
Creatinine, Ser: 1.72 mg/dL — ABNORMAL HIGH (ref 0.61–1.24)
GFR, Estimated: 42 mL/min — ABNORMAL LOW (ref >60)
Glucose, Bld: 125 mg/dL — ABNORMAL HIGH (ref 70–99)
Potassium: 4.3 mmol/L (ref 3.5–5.1)
Sodium: 146 mmol/L — ABNORMAL HIGH (ref 135–145)
Total Bilirubin: 1.8 mg/dL — ABNORMAL HIGH (ref 0.3–1.2)
Total Protein: 7.4 g/dL (ref 6.5–8.1)

## 2021-04-27 LAB — CBC
HCT: 47.7 % (ref 39.0–52.0)
Hemoglobin: 14.5 g/dL (ref 13.0–17.0)
MCH: 28.9 pg (ref 26.0–34.0)
MCHC: 30.4 g/dL (ref 30.0–36.0)
MCV: 95 fL (ref 80.0–100.0)
Platelets: 185 10*3/uL (ref 150–400)
RBC: 5.02 MIL/uL (ref 4.22–5.81)
RDW: 15.5 % (ref 11.5–15.5)
WBC: 14 10*3/uL — ABNORMAL HIGH (ref 4.0–10.5)
nRBC: 0 % (ref 0.0–0.2)

## 2021-04-27 LAB — PHOSPHORUS: Phosphorus: 2.4 mg/dL — ABNORMAL LOW (ref 2.5–4.6)

## 2021-04-27 LAB — MAGNESIUM: Magnesium: 2.3 mg/dL (ref 1.7–2.4)

## 2021-04-27 MED ORDER — OXYCODONE HCL 5 MG PO TABS: 5.0000 mg | ORAL_TABLET | ORAL | Status: DC | PRN

## 2021-04-27 MED ORDER — HYDROMORPHONE HCL 1 MG/ML IJ SOLN: 1.0000 mg | INTRAMUSCULAR | Status: DC | PRN

## 2021-04-27 MED ORDER — SODIUM CHLORIDE 0.45 % IV SOLN
INTRAVENOUS | Status: DC
Start: 2021-04-27 — End: 2021-04-28

## 2021-04-27 MED ORDER — ACETAMINOPHEN 325 MG PO TABS: 650.0000 mg | ORAL_TABLET | Freq: Four times a day (QID) | ORAL | Status: DC | PRN

## 2021-04-27 MED ORDER — INSULIN ASPART 100 UNIT/ML IJ SOLN
0.0000 [IU] | Freq: Three times a day (TID) | INTRAMUSCULAR | Status: DC
  Administered 2021-04-28: 1 [IU] via SUBCUTANEOUS
  Administered 2021-04-28: 2 [IU] via SUBCUTANEOUS

## 2021-04-27 NOTE — Progress Notes (Signed)
PROGRESS NOTE     Blake Rodriguez, is a 71 y.o. male, DOB - Aug 03, 1950, XKG:[id number]  Admit date - 04/26/2021   Admitting Physician Bernadette Hoit, DO  Outpatient Primary MD for the patient is [facility] [place]  LOS - 1  Chief Complaint  Patient presents with   Constipation        Brief Narrative:  71 y.o. male with medical history significant for hypertension, hyperlipidemia, T2DM admitted on 04/26/2021 with sepsis secondary to perforated appendicitis- -Now requiring IV antibiotics IV pain medications and IV fluids     -Assessment and Plan: -Sepsis secondary to perforated appendicitis- POA -Lactic acidosis resolved with IV fluids -Sepsis pathophysiology improving -Continue IV Zosyn -General surgeon advised liquid diet for now with possible advancement -Continue opiates and antiemetics -General surgery consult from Dr. Constance Haw appreciated -Had BMs and abdominal pain improving  -HTN- -BP improving, continue amlodipine 10 mg daily - hold lisinopril due to AKI   may use IV labetalol when necessary  Every 4 hours for systolic blood pressure over 160 mmhg  HLD--continue Lipitor  DM2- A1c 7.6 reflecting uncontrolled diabetes with hyperglycemia PTA Hold metformin and glipizide while n.p.o. Use Novolog/Humalog Sliding scale insulin with Accu-Cheks/Fingersticks as ordered   Kidney lesion CT abdomen and pelvis with contrast showed multiple renal lesions Follow-up nonemergent abdominal MRI with and without IV gadolinium is recommended in the near future after resolution of the patient's acute illness to definitively characterize this finding and exclude neoplasm. -Repeat abdominal imaging as outpatient advised  AKI (acute kidney injury) ([place]) BUN/creatinine 21/1.59 (no recent creatinine for comparison).   -Suspect dehydration related -Creatinine up to 1.72 in the setting of dehydration and recent contrast study -Hold lisinopril -Continue IV fluids Renally adjust  medications, avoid nephrotoxic agents/dehydration/hypotension  Disposition/Need for in-Hospital Stay- patient unable to be discharged at this time due to --sepsis secondary to perforated appendectomy requiring IV fluids and IV antibiotics pending return of bowel function and tolerance of oral intake*  Status is: Inpatient   Disposition: The patient is from: Home              Anticipated d/c is to: Home              Anticipated d/c date is: 2 days              Patient currently is not medically stable to d/c. Barriers: Not Clinically Stable-   Code Status :  -  Code Status: Full Code   Family Communication:    (patient is alert, awake and coherent)  Discussed with wife   DVT Prophylaxis  :   - SCDs  enoxaparin (LOVENOX) injection 40 mg Start: 04/26/21 1000 SCDs Start: 04/26/21 0802   Lab Results  Component Value Date   PLT 185 04/27/2021    Inpatient Medications  Scheduled Meds:  amLODipine  10 mg Oral Daily   atorvastatin  80 mg Oral Daily   enoxaparin (LOVENOX) injection  40 mg Subcutaneous Q24H   insulin aspart  0-9 Units Subcutaneous TID AC & HS   lisinopril  40 mg Oral Daily   tamsulosin  0.4 mg Oral QPC supper   Continuous Infusions:  sodium chloride 150 mL/hr at 04/27/21 1740   piperacillin-tazobactam (ZOSYN)  IV 3.375 g (04/27/21 1539)   PRN Meds:.acetaminophen, HYDROmorphone (DILAUDID) injection, labetalol, oxyCODONE   Anti-infectives (From admission, onward)    Start     Dose/Rate Route Frequency Ordered Stop   04/26/21 1400  piperacillin-tazobactam (ZOSYN) IVPB 3.375 g  3.375 g 12.5 mL/hr over 240 Minutes Intravenous Every 8 hours 04/26/21 0728     04/26/21 0530  piperacillin-tazobactam (ZOSYN) IVPB 3.375 g        3.375 g 12.5 mL/hr over 240 Minutes Intravenous  Once 04/26/21 0528 04/26/21 0945         Subjective: [name] today has no fevers,   No chest pain,   -Had BMs, -Eager to try liquid diet  Objective: Vitals:   04/26/21 1848  04/27/21 0450 04/27/21 0909 04/27/21 1345  BP: (!) 149/88 (!) 158/80 (!) 162/82 140/71  Pulse: (!) 116 (!) 110  (!) 102  Resp: 20 19  16   Temp: 98.7 F (37.1 C) 98.1 F (36.7 C)  98.6 F (37 C)  TempSrc: Oral Oral  Axillary  SpO2: 99% 100%  98%  Weight:      Height:        Intake/Output Summary (Last 24 hours) at 04/27/2021 1836 Last data filed at 04/27/2021 1416 Gross per 24 hour  Intake 3112.36 ml  Output --  Net 3112.36 ml   Filed Weights   04/26/21 0050  Weight: 77.1 kg    Physical Exam  Gen:- Awake Alert,  in no apparent distress  HEENT:- [place].AT, No sclera icterus Neck-Supple Neck,No JVD,.  Lungs-  CTAB , fair symmetrical air movement CV- S1, S2 normal, regular  Abd-  +ve B.Sounds, Abd Soft, improving lower abdominal tenderness with slight rebound and  guarding    Extremity/Skin:- No  edema, pedal pulses present  Psych-affect is appropriate, oriented x3 Neuro-no new focal deficits, no tremors  Data Reviewed: I have personally reviewed following labs and imaging studies  CBC: Recent Labs  Lab 04/26/21 0359 04/27/21 0430  WBC 13.9* 14.0*  NEUTROABS 12.6*  --   HGB 16.1 14.5  HCT 51.7 47.7  MCV 93.2 95.0  PLT 209 762   Basic Metabolic Panel: Recent Labs  Lab 04/26/21 0359 04/27/21 0430  NA 140 146*  K 3.8 4.3  CL 107 113*  CO2 16* 20*  GLUCOSE 176* 125*  BUN 21 23  CREATININE 1.59* 1.72*  CALCIUM 9.1 8.7*  MG  --  2.3  PHOS  --  2.4*   GFR: Estimated Creatinine Clearance: 40.5 mL/min (A) (by C-G formula based on SCr of 1.72 mg/dL (H)). Liver Function Tests: Recent Labs  Lab 04/26/21 0359 04/27/21 0430  AST 20 24  ALT 17 14  ALKPHOS 65 61  BILITOT 1.7* 1.8*  PROT 8.1 7.4  ALBUMIN 4.1 3.5   Cardiac Enzymes: No results for input(s): CKTOTAL, CKMB, CKMBINDEX, TROPONINI in the last 168 hours. BNP (last 3 results) No results for input(s): PROBNP in the last 8760 hours. HbA1C: Recent Labs    04/26/21 0359  HGBA1C 7.6*   Sepsis  Labs: @LABRCNTIP (procalcitonin:4,lacticidven:4) ) Recent Results (from the past 240 hour(s))  Resp Panel by RT-PCR (Flu A&B, Covid) Nasopharyngeal Swab     Status: None   Collection Time: 04/26/21  5:51 AM   Specimen: Nasopharyngeal Swab; Nasopharyngeal(NP) swabs in vial transport medium  Result Value Ref Range Status   SARS Coronavirus 2 by RT PCR NEGATIVE NEGATIVE Final    Comment: (NOTE) SARS-CoV-2 target nucleic acids are NOT DETECTED.  The SARS-CoV-2 RNA is generally detectable in upper respiratory specimens during the acute phase of infection. The lowest concentration of SARS-CoV-2 viral copies this assay can detect is 138 copies/mL. A negative result does not preclude SARS-Cov-2 infection and should not be used as the sole basis for  treatment or other patient management decisions. A negative result may occur with  improper specimen collection/handling, submission of specimen other than nasopharyngeal swab, presence of viral mutation(s) within the areas targeted by this assay, and inadequate number of viral copies(<138 copies/mL). A negative result must be combined with clinical observations, patient history, and epidemiological information. The expected result is Negative.  Fact Sheet for Patients:  EntrepreneurPulse.com.au  Fact Sheet for Healthcare Providers:  IncredibleEmployment.be  This test is no t yet approved or cleared by the [place] FDA and  has been authorized for detection and/or diagnosis of SARS-CoV-2 by FDA under an Emergency Use Authorization (EUA). This EUA will remain  in effect (meaning this test can be used) for the duration of the COVID-19 declaration under Section 564(b)(1) of the Act, 21 U.S.C.section 360bbb-3(b)(1), unless the authorization is terminated  or revoked sooner.       Influenza A by PCR NEGATIVE NEGATIVE Final   Influenza B by PCR NEGATIVE NEGATIVE Final    Comment: (NOTE) The Xpert Xpress  SARS-CoV-2/FLU/RSV plus assay is intended as an aid in the diagnosis of influenza from Nasopharyngeal swab specimens and should not be used as a sole basis for treatment. Nasal washings and aspirates are unacceptable for Xpert Xpress SARS-CoV-2/FLU/RSV testing.  Fact Sheet for Patients: EntrepreneurPulse.com.au  Fact Sheet for Healthcare Providers: IncredibleEmployment.be  This test is not yet approved or cleared by the [place] FDA and has been authorized for detection and/or diagnosis of SARS-CoV-2 by FDA under an Emergency Use Authorization (EUA). This EUA will remain in effect (meaning this test can be used) for the duration of the COVID-19 declaration under Section 564(b)(1) of the Act, 21 U.S.C. section 360bbb-3(b)(1), unless the authorization is terminated or revoked.  Performed at [facility], [address], [place], [place] [address]       Radiology Studies: CT ABDOMEN PELVIS W CONTRAST  Result Date: 04/26/2021 CLINICAL DATA:  71 year old male with history of acute onset of nonlocalized abdominal pain. Nausea and vomiting. EXAM: CT ABDOMEN AND PELVIS WITH CONTRAST TECHNIQUE: Multidetector CT imaging of the abdomen and pelvis was performed using the standard protocol following bolus administration of intravenous contrast. RADIATION DOSE REDUCTION: This exam was performed according to the departmental dose-optimization program which includes automated exposure control, adjustment of the mA and/or kV according to patient size and/or use of iterative reconstruction technique. CONTRAST:  110mL OMNIPAQUE IOHEXOL 300 MG/ML  SOLN COMPARISON:  No priors. FINDINGS: Lower chest: Elevation of the left hemidiaphragm. Mild scarring in the lung bases. Hepatobiliary: In segment 6 of the liver (axial image 27 of series 2) there is a 1.9 x 1.5 cm well-defined low-attenuation lesion compatible with a simple cyst. No other suspicious appearing hepatic lesions  are noted. No intra or extrahepatic biliary ductal dilatation. Gallbladder is normal in appearance. Pancreas: No pancreatic mass. No pancreatic ductal dilatation. No pancreatic or peripancreatic fluid collections or inflammatory changes. Spleen: Unremarkable. Adrenals/Urinary Tract: Multiple low-attenuation lesions in both kidneys, compatible with simple cysts, largest of which is exophytic in the upper pole of the right kidney measuring up to 3.9 cm in diameter. Other lesions are either too small to definitively characterize or are considered indeterminate. The best example of this is a lesion in the interpolar region of the right kidney (axial image 25 of series 2) measuring 2.0 x 2.1 cm which is intermediate attenuation (29 HU) and slightly irregular in shape. No hydroureteronephrosis. Urinary bladder is normal in appearance. Bilateral adrenal glands are normal in appearance. Stomach/Bowel:  The appearance of the stomach is normal. There is no pathologic dilatation of small bowel or colon. The appendix is dilated and inflamed, compatible with an acute appendicitis. Appendix: Location: Posterior and medial to the cecum Diameter: 12 mm Appendicolith: None Mucosal hyper-enhancement: Present Extraluminal gas: Trace amount Periappendiceal collection: Small amount of fluid in the periappendiceal region and in the low anatomic pelvis. At this time, this appears non organized. Vascular/Lymphatic: Aortic atherosclerosis with fusiform ectasia of the infrarenal abdominal aorta which measures up to 2.7 x 2.6 cm. No lymphadenopathy noted in the abdomen or pelvis. Reproductive: Prostate gland and seminal vesicles are unremarkable in appearance. Other: Small volume of ascites and trace volume of pneumoperitoneum, most evident in the low anatomic pelvis around the region of the inflamed appendix. Musculoskeletal: There are no aggressive appearing lytic or blastic lesions noted in the visualized portions of the skeleton.  IMPRESSION: 1. Acute perforated appendicitis, as above. Surgical consultation is strongly recommended. 2. Multiple renal lesions. The majority of these appear to represent simple cysts. There is one lesion in the interpolar region of the right kidney which is considered indeterminate (favored to be a mildly proteinaceous cyst). Follow-up nonemergent abdominal MRI with and without IV gadolinium is recommended in the near future after resolution of the patient's acute illness to definitively characterize this finding and exclude neoplasm. 3. Aortic atherosclerosis with fusiform ectasia of the infrarenal abdominal aorta which measures up to 2.7 x 2.6 cm. 4. Additional incidental findings, as above. Critical Value/emergent results were called by telephone at the time of interpretation on 04/26/2021 at 5:28 am to provider Veryl Speak, who verbally acknowledged these results. Electronically Signed   By: Vinnie Langton M.D.   On: 04/26/2021 05:28     Scheduled Meds:  amLODipine  10 mg Oral Daily   atorvastatin  80 mg Oral Daily   enoxaparin (LOVENOX) injection  40 mg Subcutaneous Q24H   insulin aspart  0-9 Units Subcutaneous TID AC & HS   lisinopril  40 mg Oral Daily   tamsulosin  0.4 mg Oral QPC supper   Continuous Infusions:  sodium chloride 150 mL/hr at 04/27/21 1740   piperacillin-tazobactam (ZOSYN)  IV 3.375 g (04/27/21 1539)     LOS: 1 day   Roxan Hockey M.D on 04/27/2021 at 6:36 PM  Go to [email or web address] - for contact info  [facility] - Office  [phone]  If 7PM-7AM, please contact night-coverage [email or web address] Password [facility] 04/27/2021, 6:36 PM

## 2021-04-27 NOTE — Progress Notes (Signed)
[name] Progress Note     Subjective: Feeling better. Wants a steak. Had a BM.   Objective: Vital signs in last 24 hours: Temp:  [98.1 F (36.7 C)-98.7 F (37.1 C)] 98.1 F (36.7 C) (02/26 0450) Pulse Rate:  [102-116] 102 (02/26 1345) Resp:  [16-22] 16 (02/26 1345) BP: (140-162)/(71-88) 140/71 (02/26 1345) SpO2:  [98 %-100 %] 98 % (02/26 1345)    Intake/Output from previous day: 02/25 0701 - 02/26 0700 In: 2632.4 [I.V.:2532.4; IV Piggyback:100] Out: 700 [Urine:700] Intake/Output this shift: No intake/output data recorded.  General appearance: alert and no distress Resp: normal work of breathing GI: soft, minimally distended, tender RLQ but improved   Lab Results:  Recent Labs    04/26/21 0359 04/27/21 0430  WBC 13.9* 14.0*  HGB 16.1 14.5  HCT 51.7 47.7  PLT 209 185   BMET Recent Labs    04/26/21 0359 04/27/21 0430  NA 140 146*  K 3.8 4.3  CL 107 113*  CO2 16* 20*  GLUCOSE 176* 125*  BUN 21 23  CREATININE 1.59* 1.72*  CALCIUM 9.1 8.7*   PT/INR No results for input(s): LABPROT, INR in the last 72 hours.  Studies/Results: CT ABDOMEN PELVIS W CONTRAST  Result Date: 04/26/2021 CLINICAL DATA:  71 year old male with history of acute onset of nonlocalized abdominal pain. Nausea and vomiting. EXAM: CT ABDOMEN AND PELVIS WITH CONTRAST TECHNIQUE: Multidetector CT imaging of the abdomen and pelvis was performed using the standard protocol following bolus administration of intravenous contrast. RADIATION DOSE REDUCTION: This exam was performed according to the departmental dose-optimization program which includes automated exposure control, adjustment of the mA and/or kV according to patient size and/or use of iterative reconstruction technique. CONTRAST:  172mL OMNIPAQUE IOHEXOL 300 MG/ML  SOLN COMPARISON:  No priors. FINDINGS: Lower chest: Elevation of the left hemidiaphragm. Mild scarring in the lung bases. Hepatobiliary: In segment 6 of the  liver (axial image 27 of series 2) there is a 1.9 x 1.5 cm well-defined low-attenuation lesion compatible with a simple cyst. No other suspicious appearing hepatic lesions are noted. No intra or extrahepatic biliary ductal dilatation. Gallbladder is normal in appearance. Pancreas: No pancreatic mass. No pancreatic ductal dilatation. No pancreatic or peripancreatic fluid collections or inflammatory changes. Spleen: Unremarkable. Adrenals/Urinary Tract: Multiple low-attenuation lesions in both kidneys, compatible with simple cysts, largest of which is exophytic in the upper pole of the right kidney measuring up to 3.9 cm in diameter. Other lesions are either too small to definitively characterize or are considered indeterminate. The best example of this is a lesion in the interpolar region of the right kidney (axial image 25 of series 2) measuring 2.0 x 2.1 cm which is intermediate attenuation (29 HU) and slightly irregular in shape. No hydroureteronephrosis. Urinary bladder is normal in appearance. Bilateral adrenal glands are normal in appearance. Stomach/Bowel: The appearance of the stomach is normal. There is no pathologic dilatation of small bowel or colon. The appendix is dilated and inflamed, compatible with an acute appendicitis. Appendix: Location: Posterior and medial to the cecum Diameter: 12 mm Appendicolith: None Mucosal hyper-enhancement: Present Extraluminal gas: Trace amount Periappendiceal collection: Small amount of fluid in the periappendiceal region and in the low anatomic pelvis. At this time, this appears non organized. Vascular/Lymphatic: Aortic atherosclerosis with fusiform ectasia of the infrarenal abdominal aorta which measures up to 2.7 x 2.6 cm. No lymphadenopathy noted in the abdomen or pelvis. Reproductive: Prostate gland and seminal vesicles are unremarkable in appearance. Other: Small volume of  ascites and trace volume of pneumoperitoneum, most evident in the low anatomic pelvis around  the region of the inflamed appendix. Musculoskeletal: There are no aggressive appearing lytic or blastic lesions noted in the visualized portions of the skeleton. IMPRESSION: 1. Acute perforated appendicitis, as above. Surgical consultation is strongly recommended. 2. Multiple renal lesions. The majority of these appear to represent simple cysts. There is one lesion in the interpolar region of the right kidney which is considered indeterminate (favored to be a mildly proteinaceous cyst). Follow-up nonemergent abdominal MRI with and without IV gadolinium is recommended in the near future after resolution of the patient's acute illness to definitively characterize this finding and exclude neoplasm. 3. Aortic atherosclerosis with fusiform ectasia of the infrarenal abdominal aorta which measures up to 2.7 x 2.6 cm. 4. Additional incidental findings, as above. Critical Value/emergent results were called by telephone at the time of interpretation on 04/26/2021 at 5:28 am to provider Veryl Speak, who verbally acknowledged these results. Electronically Signed   By: Vinnie Langton M.D.   On: 04/26/2021 05:28    Anti-infectives: Anti-infectives (From admission, onward)    Start     Dose/Rate Route Frequency Ordered Stop   04/26/21 1400  piperacillin-tazobactam (ZOSYN) IVPB 3.375 g        3.375 g 12.5 mL/hr over 240 Minutes Intravenous Every 8 hours 04/26/21 0728     04/26/21 0530  piperacillin-tazobactam (ZOSYN) IVPB 3.375 g        3.375 g 12.5 mL/hr over 240 Minutes Intravenous  Once 04/26/21 9485 04/26/21 0945       Assessment/Plan: Patient with acute perforated appendicitis, doing better with IV zosyn. No fevers. Had Bms. Full liquid diet, adv as tolerated Tylenol and roxicodone added for pain If does ok may get home tomorrow with Augmentin at dc for total 10 days  Discussed again follow up in clinic and interval appy in 6-8 weeks   LOS: 1 day    [name] 04/27/2021

## 2021-04-27 NOTE — TOC Progression Note (Signed)
Transition of Care [facility]) - Progression Note    Patient Details  Name: DUSTAN HYAMS MRN: [id number] Date of Birth: March 16, 1950  Transition of Care [facility]) CM/SW Contact  Salome Arnt, [place] Phone Number: 04/27/2021, 8:43 AM  Clinical Narrative: [place] notification completed. Notification ID: [id number].           Expected Discharge Plan and Services                                                 Social Determinants of Health (SDOH) Interventions    Readmission Risk Interventions No flowsheet data found.

## 2021-04-28 DIAGNOSIS — K3532 Acute appendicitis with perforation and localized peritonitis, without abscess: Secondary | ICD-10-CM | POA: Diagnosis not present

## 2021-04-28 LAB — BASIC METABOLIC PANEL
Anion gap: 9 (ref 5–15)
BUN: 18 mg/dL (ref 8–23)
CO2: 18 mmol/L — ABNORMAL LOW (ref 22–32)
Calcium: 8 mg/dL — ABNORMAL LOW (ref 8.9–10.3)
Chloride: 111 mmol/L (ref 98–111)
Creatinine, Ser: 1.4 mg/dL — ABNORMAL HIGH (ref 0.61–1.24)
GFR, Estimated: 54 mL/min — ABNORMAL LOW (ref >60)
Glucose, Bld: 133 mg/dL — ABNORMAL HIGH (ref 70–99)
Potassium: 3.3 mmol/L — ABNORMAL LOW (ref 3.5–5.1)
Sodium: 138 mmol/L (ref 135–145)

## 2021-04-28 LAB — GLUCOSE, CAPILLARY
Glucose-Capillary: 127 mg/dL — ABNORMAL HIGH (ref 70–99)
Glucose-Capillary: 166 mg/dL — ABNORMAL HIGH (ref 70–99)

## 2021-04-28 LAB — CBC
HCT: 38.8 % — ABNORMAL LOW (ref 39.0–52.0)
Hemoglobin: 12.5 g/dL — ABNORMAL LOW (ref 13.0–17.0)
MCH: 30.3 pg (ref 26.0–34.0)
MCHC: 32.2 g/dL (ref 30.0–36.0)
MCV: 93.9 fL (ref 80.0–100.0)
Platelets: 163 10*3/uL (ref 150–400)
RBC: 4.13 MIL/uL — ABNORMAL LOW (ref 4.22–5.81)
RDW: 15.4 % (ref 11.5–15.5)
WBC: 11.6 10*3/uL — ABNORMAL HIGH (ref 4.0–10.5)
nRBC: 0 % (ref 0.0–0.2)

## 2021-04-28 MED ORDER — ACETAMINOPHEN 325 MG PO TABS: 650.0000 mg | ORAL_TABLET | Freq: Four times a day (QID) | ORAL | 0 refills | Status: AC | PRN

## 2021-04-28 MED ORDER — POTASSIUM CHLORIDE CRYS ER 20 MEQ PO TBCR
40.0000 meq | EXTENDED_RELEASE_TABLET | ORAL | Status: AC
Start: 2021-04-28 — End: 2021-04-28
  Administered 2021-04-28 (×2): 40 meq via ORAL
  Filled 2021-04-28 (×2): qty 2

## 2021-04-28 MED ORDER — AMOXICILLIN-POT CLAVULANATE 875-125 MG PO TABS: 1.0000 | ORAL_TABLET | Freq: Two times a day (BID) | ORAL | 0 refills | Status: DC

## 2021-04-28 MED ORDER — OXYCODONE HCL 5 MG PO TABS: 5.0000 mg | ORAL_TABLET | Freq: Four times a day (QID) | ORAL | 0 refills | Status: DC | PRN

## 2021-04-28 MED ORDER — ASPIRIN EC 81 MG PO TBEC: 81.0000 mg | DELAYED_RELEASE_TABLET | Freq: Every day | ORAL | 11 refills | Status: DC

## 2021-04-28 NOTE — Progress Notes (Signed)
Nsg Discharge Note  Admit Date:  04/26/2021 Discharge date: 04/28/2021   Aqu[place] Solian to be D/C'd Home per MD order.  AVS completed.  Copy for chart, and copy for patient signed, and dated. Patient/caregiver able to verbalize understanding.  Discharge Medication: Allergies as of 04/28/2021   No Known Allergies      Medication List     STOP taking these medications    NIFEdipine 90 MG 24 hr tablet Commonly known as: ADALAT CC       TAKE these medications    acetaminophen 325 MG tablet Commonly known as: TYLENOL Take 2 tablets (650 mg total) by mouth every 6 (six) hours as needed for mild pain, fever or headache.   amLODipine 10 MG tablet Commonly known as: NORVASC Take 10 mg by mouth daily.   amoxicillin-clavulanate 875-125 MG tablet Commonly known as: Augmentin Take 1 tablet by mouth 2 (two) times daily for 7 days.   aspirin EC 81 MG tablet Take 1 tablet (81 mg total) by mouth daily with breakfast. What changed: when to take this   atorvastatin 80 MG tablet Commonly known as: LIPITOR Take 80 mg by mouth daily.   empagliflozin 25 MG Tabs tablet Commonly known as: JARDIANCE Take 1 tablet by mouth daily.   glipiZIDE 5 MG tablet Commonly known as: GLUCOTROL Take 5 mg by mouth 2 (two) times daily before a meal.   lisinopril 40 MG tablet Commonly known as: ZESTRIL Take 40 mg by mouth daily.   metFORMIN 500 MG 24 hr tablet Commonly known as: GLUCOPHAGE-XR Take 500 mg by mouth daily.   oxyCODONE 5 MG immediate release tablet Commonly known as: Oxy IR/ROXICODONE Take 1 tablet (5 mg total) by mouth every 6 (six) hours as needed for moderate pain.        Discharge Assessment: Vitals:   04/28/21 1458 04/28/21 1458  BP: (!) 155/82 (!) 155/82  Pulse: (!) 108 (!) 108  Resp: 18 18  Temp: 98.4 F (36.9 C) 98.4 F (36.9 C)  SpO2: 98% 98%   Skin clean, dry and intact without evidence of skin break down, no evidence of skin tears noted. IV catheter  discontinued intact. Site without signs and symptoms of complications - no redness or edema noted at insertion site, patient denies c/o pain - only slight tenderness at site.  Dressing with slight pressure applied.  D/c Instructions-Education: Discharge instructions given to patient/family with verbalized understanding. D/c education completed with patient/family including follow up instructions, medication list, d/c activities limitations if indicated, with other d/c instructions as indicated by MD - patient able to verbalize understanding, all questions fully answered. Patient instructed to return to ED, call 911, or call MD for any changes in condition.  Patient escorted via [place], and D/C home via private auto.  Dorcas Mcmurray,  LPN 5/46/5681 2:75 PM

## 2021-04-28 NOTE — TOC Progression Note (Signed)
°  Transition of Care [facility]) Screening Note   Patient Details  Name: Blake Rodriguez Date of Birth: 09-Jun-1950   Transition of Care [facility]) CM/SW Contact:    Shade Flood, LCSW Phone Number: 04/28/2021, 11:00 AM    Transition of Care Department [facility]) has reviewed patient and no TOC needs have been identified at this time. We will continue to monitor patient advancement through interdisciplinary progression rounds. If new patient transition needs arise, please place a TOC consult.

## 2021-04-28 NOTE — Discharge Instructions (Addendum)
1)Avoid ibuprofen/Advil/Aleve/Motrin/Goody Powders/Naproxen/BC powders/Meloxicam/Diclofenac/Indomethacin and other Nonsteroidal anti-inflammatory medications as these will make you more likely to bleed and can cause stomach ulcers, can also cause Kidney problems.   2)Follow up with General Surgeon Dr Constance Haw in 2 weeks for recheck and reevaluation  3)Repeat CBC and BMP blood test with primary care physician on Friday, 05/02/2021  4)Soft Diet advised--- avoid constipation  5)CT abdomen and pelvis with contrast showed multiple renal lesions---Please get MRI abdomen with and without contrast in about 4 to 8 weeks

## 2021-04-28 NOTE — Progress Notes (Signed)
Patient up and down to bathroom throughout the night. Patient gets discomfort in his lower abdomen and feels the urge to have BM. Each time he passes a small amount of liquid yellow stool. This began after starting PO intake yesterday. Vital signs stable, will continue to monitor.

## 2021-04-28 NOTE — Progress Notes (Signed)
[name] Progress Note     Subjective: Doing better. Had lower abdominal pain before BM.   Objective: Vital signs in last 24 hours: Temp:  [98.2 F (36.8 C)-98.6 F (37 C)] 98.2 F (36.8 C) (02/27 0508) Pulse Rate:  [102-115] 104 (02/27 0508) Resp:  [16-20] 20 (02/27 0508) BP: (140-166)/(67-82) 155/67 (02/27 0508) SpO2:  [98 %-100 %] 99 % (02/27 0508) Last BM Date : 04/27/21  Intake/Output from previous day: 02/26 0701 - 02/27 0700 In: 3669 [P.O.:720; I.V.:2798; IV Piggyback:151] Out: -  Intake/Output this shift: Total I/O In: 320 [P.O.:320] Out: -   General appearance: alert and no distress Resp: normal work of breathing GI: soft, mildly distended, improving RLQ tenderness  Lab Results:  Recent Labs    04/27/21 0430 04/28/21 0427  WBC 14.0* 11.6*  HGB 14.5 12.5*  HCT 47.7 38.8*  PLT 185 163   BMET Recent Labs    04/27/21 0430 04/28/21 0427  NA 146* 138  K 4.3 3.3*  CL 113* 111  CO2 20* 18*  GLUCOSE 125* 133*  BUN 23 18  CREATININE 1.72* 1.40*  CALCIUM 8.7* 8.0*   PT/INR No results for input(s): LABPROT, INR in the last 72 hours.  Studies/Results: No results found.  Anti-infectives: Anti-infectives (From admission, onward)    Start     Dose/Rate Route Frequency Ordered Stop   04/26/21 1400  piperacillin-tazobactam (ZOSYN) IVPB 3.375 g        3.375 g 12.5 mL/hr over 240 Minutes Intravenous Every 8 hours 04/26/21 0728     04/26/21 0530  piperacillin-tazobactam (ZOSYN) IVPB 3.375 g        3.375 g 12.5 mL/hr over 240 Minutes Intravenous  Once 04/26/21 0528 04/26/21 0945       Assessment/Plan: Patient with acute perforated appendicitis. Doing well overall.  PRN for pain IS, OOB Soft diet CBC down, zosyn for perforated appendicitis If dc home will do augmentin for total 10 day course  Future Appointments  Date Time Provider [place]  05/06/2021 10:15 AM [name], MD [facility] None     LOS: 2 days     [name] 04/28/2021

## 2021-04-28 NOTE — Discharge Summary (Signed)
Blake Rodriguez, is a 71 y.o. male  DOB 1950-10-15  MRN [id number].  Admission date:  04/26/2021  Admitting Physician  Bernadette Hoit, DO  Discharge Date:  04/28/2021   Primary [place]  Recommendations for primary care physician for things to follow:   1)Avoid ibuprofen/Advil/Aleve/Motrin/Goody Powders/Naproxen/BC powders/Meloxicam/Diclofenac/Indomethacin and other Nonsteroidal anti-inflammatory medications as these will make you more likely to bleed and can cause stomach ulcers, can also cause Kidney problems.   2)Follow up with General Surgeon Dr Constance Haw in 2 weeks for recheck and reevaluation  3)Repeat CBC and BMP blood test with primary care physician on Friday, 05/02/2021  4)Soft Diet advised--- avoid constipation  5)CT abdomen and pelvis with contrast showed multiple renal lesions---Please get MRI abdomen with and without contrast in about 4 to 8 weeks   Admission Diagnosis  Acute perforated appendicitis [K35.32] Acute appendicitis with perforation and localized peritonitis, without abscess, unspecified whether gangrene present [K35.32]   Discharge Diagnosis  Acute perforated appendicitis [K35.32] Acute appendicitis with perforation and localized peritonitis, without abscess, unspecified whether gangrene present [K35.32]    Principal Problem:   Acute perforated appendicitis Active Problems:   Abdominal pain   High anion gap metabolic acidosis   Type 2 diabetes mellitus with hyperglycemia (HCC)   AKI (acute kidney injury) ([place])   Kidney lesion   Essential hypertension   Hyperlipidemia   Sepsis ([place])   Dehydration      Past Medical History:  Diagnosis Date   Diabetes mellitus without complication ([place])    Hypertension    Lung collapse     Past Surgical History:  Procedure Laterality Date   SHOULDER ARTHROCENTESIS       HPI  from the history and physical done on  the day of admission:   HPI: Blake Rodriguez is a 70 y.o. male with medical history significant for hypertension, hyperlipidemia, T2DM who presents to the emergency department accompanied by daughter due to 1 day onset of abdominal pain.  Abdominal pain was in RLQ and suprapubic area and was rated as 10/10 on pain scale, this was associated with nausea and nonbloody vomiting.  It was initially thought that this was due to constipation, since he feels like he needed to have a bowel movement but was unable, so he decided to go to the ED for further evaluation and management.  Patient denies fever, chills, chest pain, shortness of breath, or any prior abdominal surgery.  ED course: In the emergency department, he was tachypneic and tachycardic, BP was 146/67.  Work-up in ED showed anion gap metabolic acidosis, CBG 528, BUN/creatinine 21/1.59 (no recent creatinine for comparison).  Lipase 34 CT abdomen and pelvis with contrast showed acute perforated appendicitis Patient was treated with IV Zosyn, IV hydration was provided, morphine was given due to abdominal pain and Zofran due to nausea and vomiting.  General surgery was consulted and will see patient this morning per ED physician.  Hospitalist was asked to admit patient for further evaluation and management.   Review of  Systems: As mentioned in the history of present illness. All other systems reviewed and are negative.   Hospital Course:   Brief Narrative:  71 y.o. male with medical history significant for hypertension, hyperlipidemia, T2DM admitted on 04/26/2021 with sepsis secondary to perforated appendicitis- -Treated with IV antibiotics, IV pain medications and IV fluids   Assessment and Plan: -Sepsis secondary to perforated appendicitis- POA -Lactic acidosis resolved with IV fluids -Sepsis pathophysiology resolved -Treated with IV Zosyn, discharge on Augmentin -General surgery consult from Dr. Constance Haw appreciated Having BMs Tolerating  solids well No fever  Or chills    -HTN- Continue amlodipine  Restart lisinopril    HLD--continue Lipitor   DM2- A1c 7.6 reflecting uncontrolled diabetes with hyperglycemia PTA c/n metformin and glipizide   Kidney lesion CT abdomen and pelvis with contrast showed multiple renal lesions Follow-up nonemergent abdominal MRI with and without IV gadolinium is recommended in the near future after resolution of the patient's acute illness to definitively characterize this finding and exclude neoplasm. -Repeat abdominal imaging as outpatient advised    AKI (acute kidney injury) ([place]) BUN/creatinine 21/1.59 (no recent creatinine for comparison).   -Suspect dehydration related -Creatinine down to 1.4 from 1.72   -Repeat BMP on 05/02/21   Disposition--home    Disposition: The patient is from: Home              Anticipated d/c is to: Home   Discharge Condition: stable  Follow UP   Follow-up Information     [name], MD. Schedule an appointment as soon as possible for a visit in 1 week(s).   Specialty: General Surgery Contact information: [address] [place] [address] [phone]                  Consults obtained - Gen surgery  Diet and Activity recommendation:  As advised  Discharge Instructions    Discharge Instructions     Call MD for:  difficulty breathing, headache or visual disturbances   Complete by: As directed    Call MD for:  persistant dizziness or light-headedness   Complete by: As directed    Call MD for:  persistant nausea and vomiting   Complete by: As directed    Call MD for:  severe uncontrolled pain   Complete by: As directed    Call MD for:  temperature >100.4   Complete by: As directed    Diet - low sodium heart healthy   Complete by: As directed    -soft diet advised--- avoid constipation   Diet Carb Modified   Complete by: As directed    -Soft diet advised--- avoid constipation   Discharge instructions    Complete by: As directed    1)Avoid ibuprofen/Advil/Aleve/Motrin/Goody Powders/Naproxen/BC powders/Meloxicam/Diclofenac/Indomethacin and other Nonsteroidal anti-inflammatory medications as these will make you more likely to bleed and can cause stomach ulcers, can also cause Kidney problems.   2)Follow up with General Surgeon Dr Constance Haw in 2 weeks for recheck and reevaluation  3)Repeat CBC and BMP blood test with primary care physician on Friday, 05/02/2021  4)Soft Diet advised--- avoid constipation  5)CT abdomen and pelvis with contrast showed multiple renal lesions---Please get MRI abdomen with and without contrast in about 4 to 8 weeks   Increase activity slowly   Complete by: As directed          Discharge Medications     Allergies as of 04/28/2021   No Known Allergies      Medication List  STOP taking these medications    NIFEdipine 90 MG 24 hr tablet Commonly known as: ADALAT CC       TAKE these medications    acetaminophen 325 MG tablet Commonly known as: TYLENOL Take 2 tablets (650 mg total) by mouth every 6 (six) hours as needed for mild pain, fever or headache.   amLODipine 10 MG tablet Commonly known as: NORVASC Take 10 mg by mouth daily.   amoxicillin-clavulanate 875-125 MG tablet Commonly known as: Augmentin Take 1 tablet by mouth 2 (two) times daily for 7 days.   aspirin EC 81 MG tablet Take 1 tablet (81 mg total) by mouth daily with breakfast. What changed: when to take this   atorvastatin 80 MG tablet Commonly known as: LIPITOR Take 80 mg by mouth daily.   empagliflozin 25 MG Tabs tablet Commonly known as: JARDIANCE Take 1 tablet by mouth daily.   glipiZIDE 5 MG tablet Commonly known as: GLUCOTROL Take 5 mg by mouth 2 (two) times daily before a meal.   lisinopril 40 MG tablet Commonly known as: ZESTRIL Take 40 mg by mouth daily.   metFORMIN 500 MG 24 hr tablet Commonly known as: GLUCOPHAGE-XR Take 500 mg by mouth daily.    oxyCODONE 5 MG immediate release tablet Commonly known as: Oxy IR/ROXICODONE Take 1 tablet (5 mg total) by mouth every 6 (six) hours as needed for moderate pain.        Major procedures and Radiology Reports - PLEASE review detailed and final reports for all details, in brief -    CT ABDOMEN PELVIS W CONTRAST  Result Date: 04/26/2021 CLINICAL DATA:  71 year old male with history of acute onset of nonlocalized abdominal pain. Nausea and vomiting. EXAM: CT ABDOMEN AND PELVIS WITH CONTRAST TECHNIQUE: Multidetector CT imaging of the abdomen and pelvis was performed using the standard protocol following bolus administration of intravenous contrast. RADIATION DOSE REDUCTION: This exam was performed according to the departmental dose-optimization program which includes automated exposure control, adjustment of the mA and/or kV according to patient size and/or use of iterative reconstruction technique. CONTRAST:  134mL OMNIPAQUE IOHEXOL 300 MG/ML  SOLN COMPARISON:  No priors. FINDINGS: Lower chest: Elevation of the left hemidiaphragm. Mild scarring in the lung bases. Hepatobiliary: In segment 6 of the liver (axial image 27 of series 2) there is a 1.9 x 1.5 cm well-defined low-attenuation lesion compatible with a simple cyst. No other suspicious appearing hepatic lesions are noted. No intra or extrahepatic biliary ductal dilatation. Gallbladder is normal in appearance. Pancreas: No pancreatic mass. No pancreatic ductal dilatation. No pancreatic or peripancreatic fluid collections or inflammatory changes. Spleen: Unremarkable. Adrenals/Urinary Tract: Multiple low-attenuation lesions in both kidneys, compatible with simple cysts, largest of which is exophytic in the upper pole of the right kidney measuring up to 3.9 cm in diameter. Other lesions are either too small to definitively characterize or are considered indeterminate. The best example of this is a lesion in the interpolar region of the right kidney  (axial image 25 of series 2) measuring 2.0 x 2.1 cm which is intermediate attenuation (29 HU) and slightly irregular in shape. No hydroureteronephrosis. Urinary bladder is normal in appearance. Bilateral adrenal glands are normal in appearance. Stomach/Bowel: The appearance of the stomach is normal. There is no pathologic dilatation of small bowel or colon. The appendix is dilated and inflamed, compatible with an acute appendicitis. Appendix: Location: Posterior and medial to the cecum Diameter: 12 mm Appendicolith: None Mucosal hyper-enhancement: Present Extraluminal gas: Trace amount Periappendiceal collection:  Small amount of fluid in the periappendiceal region and in the low anatomic pelvis. At this time, this appears non organized. Vascular/Lymphatic: Aortic atherosclerosis with fusiform ectasia of the infrarenal abdominal aorta which measures up to 2.7 x 2.6 cm. No lymphadenopathy noted in the abdomen or pelvis. Reproductive: Prostate gland and seminal vesicles are unremarkable in appearance. Other: Small volume of ascites and trace volume of pneumoperitoneum, most evident in the low anatomic pelvis around the region of the inflamed appendix. Musculoskeletal: There are no aggressive appearing lytic or blastic lesions noted in the visualized portions of the skeleton. IMPRESSION: 1. Acute perforated appendicitis, as above. Surgical consultation is strongly recommended. 2. Multiple renal lesions. The majority of these appear to represent simple cysts. There is one lesion in the interpolar region of the right kidney which is considered indeterminate (favored to be a mildly proteinaceous cyst). Follow-up nonemergent abdominal MRI with and without IV gadolinium is recommended in the near future after resolution of the patient's acute illness to definitively characterize this finding and exclude neoplasm. 3. Aortic atherosclerosis with fusiform ectasia of the infrarenal abdominal aorta which measures up to 2.7 x 2.6  cm. 4. Additional incidental findings, as above. Critical Value/emergent results were called by telephone at the time of interpretation on 04/26/2021 at 5:28 am to provider Veryl Speak, who verbally acknowledged these results. Electronically Signed   By: Vinnie Langton M.D.   On: 04/26/2021 05:28    Micro Results  Recent Results (from the past 240 hour(s))  Resp Panel by RT-PCR (Flu A&B, Covid) Nasopharyngeal Swab     Status: None   Collection Time: 04/26/21  5:51 AM   Specimen: Nasopharyngeal Swab; Nasopharyngeal(NP) swabs in vial transport medium  Result Value Ref Range Status   SARS Coronavirus 2 by RT PCR NEGATIVE NEGATIVE Final    Comment: (NOTE) SARS-CoV-2 target nucleic acids are NOT DETECTED.  The SARS-CoV-2 RNA is generally detectable in upper respiratory specimens during the acute phase of infection. The lowest concentration of SARS-CoV-2 viral copies this assay can detect is 138 copies/mL. A negative result does not preclude SARS-Cov-2 infection and should not be used as the sole basis for treatment or other patient management decisions. A negative result may occur with  improper specimen collection/handling, submission of specimen other than nasopharyngeal swab, presence of viral mutation(s) within the areas targeted by this assay, and inadequate number of viral copies(<138 copies/mL). A negative result must be combined with clinical observations, patient history, and epidemiological information. The expected result is Negative.  Fact Sheet for Patients:  EntrepreneurPulse.com.au  Fact Sheet for Healthcare Providers:  IncredibleEmployment.be  This test is no t yet approved or cleared by the [place] FDA and  has been authorized for detection and/or diagnosis of SARS-CoV-2 by FDA under an Emergency Use Authorization (EUA). This EUA will remain  in effect (meaning this test can be used) for the duration of the COVID-19 declaration  under Section 564(b)(1) of the Act, 21 U.S.C.section 360bbb-3(b)(1), unless the authorization is terminated  or revoked sooner.       Influenza A by PCR NEGATIVE NEGATIVE Final   Influenza B by PCR NEGATIVE NEGATIVE Final    Comment: (NOTE) The Xpert Xpress SARS-CoV-2/FLU/RSV plus assay is intended as an aid in the diagnosis of influenza from Nasopharyngeal swab specimens and should not be used as a sole basis for treatment. Nasal washings and aspirates are unacceptable for Xpert Xpress SARS-CoV-2/FLU/RSV testing.  Fact Sheet for Patients: EntrepreneurPulse.com.au  Fact Sheet for Healthcare Providers: IncredibleEmployment.be  This test is not yet approved or cleared by the [place] and has been authorized for detection and/or diagnosis of SARS-CoV-2 by FDA under an Emergency Use Authorization (EUA). This EUA will remain in effect (meaning this test can be used) for the duration of the COVID-19 declaration under Section 564(b)(1) of the Act, 21 U.S.C. section 360bbb-3(b)(1), unless the authorization is terminated or revoked.  Performed at [facility], [address] [place], [place] [address]    Today   Subjective    Blake Rodriguez today has no new complaints  No fever  Or chills  -Plan of care discussed with patient's wife at bedside -Tolerating oral intake/solids well -Having BMs          Patient has been seen and examined prior to discharge   Objective   Blood pressure (!) 155/82, pulse (!) 108, temperature 98.4 F (36.9 C), resp. rate 18, height 5\' 9"  (1.753 m), weight 77.1 kg, SpO2 98 %.   Intake/Output Summary (Last 24 hours) at 04/28/2021 1538 Last data filed at 04/28/2021 0900 Gross per 24 hour  Intake 3508.98 ml  Output --  Net 3508.98 ml   Exam Gen:- Awake Alert, no acute distress  HEENT:- [place].AT, No sclera icterus Neck-Supple Neck,No JVD,.  Lungs-  CTAB , good air movement bilaterally CV- S1, S2 normal,  regular Abd-  +ve B.Sounds, Abd Soft, much improved lower abdominal tenderness, no rebound or guarding Extremity/Skin:- No  edema,   good pulses Psych-affect is appropriate, oriented x3 Neuro-no new focal deficits, no tremors    Data Review   CBC w Diff:  Lab Results  Component Value Date   WBC 11.6 (H) 04/28/2021   HGB 12.5 (L) 04/28/2021   HCT 38.8 (L) 04/28/2021   PLT 163 04/28/2021   LYMPHOPCT 3 04/26/2021   MONOPCT 4 04/26/2021   EOSPCT 2 04/26/2021   BASOPCT 0 04/26/2021    CMP:  Lab Results  Component Value Date   NA 138 04/28/2021   K 3.3 (L) 04/28/2021   CL 111 04/28/2021   CO2 18 (L) 04/28/2021   BUN 18 04/28/2021   CREATININE 1.40 (H) 04/28/2021   PROT 7.4 04/27/2021   ALBUMIN 3.5 04/27/2021   BILITOT 1.8 (H) 04/27/2021   ALKPHOS 61 04/27/2021   AST 24 04/27/2021   ALT 14 04/27/2021  .  Total Discharge time is about 33 minutes  Roxan Hockey M.D on 04/28/2021 at 3:38 PM  Go to [email or web address] -  for contact info  [facility] - Office  [phone]

## 2021-05-04 ENCOUNTER — Ambulatory Visit
Admission: EM | Admit: 2021-05-04 | Discharge: 2021-05-04 | Disposition: A | Discharge status: Home / Self Care | Payer: No Typology Code available for payment source | Attending: Urgent Care | Admitting: Urgent Care

## 2021-05-04 ENCOUNTER — Other Ambulatory Visit: Payer: Self-pay

## 2021-05-04 ENCOUNTER — Ambulatory Visit (INDEPENDENT_AMBULATORY_CARE_PROVIDER_SITE_OTHER): Payer: No Typology Code available for payment source

## 2021-05-04 DIAGNOSIS — M5030 Other cervical disc degeneration, unspecified cervical region: Secondary | ICD-10-CM | POA: Diagnosis not present

## 2021-05-04 DIAGNOSIS — M542 Cervicalgia: Secondary | ICD-10-CM | POA: Diagnosis not present

## 2021-05-04 DIAGNOSIS — M436 Torticollis: Secondary | ICD-10-CM

## 2021-05-04 DIAGNOSIS — K3580 Unspecified acute appendicitis: Secondary | ICD-10-CM | POA: Diagnosis not present

## 2021-05-04 DIAGNOSIS — E119 Type 2 diabetes mellitus without complications: Secondary | ICD-10-CM | POA: Diagnosis not present

## 2021-05-04 MED ORDER — TIZANIDINE HCL 4 MG PO TABS: 4.0000 mg | ORAL_TABLET | Freq: Every day | ORAL | 0 refills | Status: DC

## 2021-05-04 MED ORDER — PREDNISONE 20 MG PO TABS: ORAL_TABLET | ORAL | 0 refills | Status: DC

## 2021-05-04 NOTE — ED Triage Notes (Signed)
Pt reports neck pain and stiffness x 3 days. States he can not move the neck to the side or stand up from the bed by himself. Tylenol gives no relief.  ?

## 2021-05-04 NOTE — ED Provider Notes (Signed)
?[place] ? ? ?MRN: [id number] DOB: 28-Mar-1950 ? ?Subjective:  ? ?Blake Rodriguez is a 71 y.o. male presenting for 3-day history of acute onset neck pain, neck stiffness, decreased range of motion.  Symptoms were unprovoked.  No accident, trauma.  He does have diabetes and is well controlled, not treated with insulin.  Patient is actually on Augmentin right now.  He was diagnosed with appendicitis at the end of February.  He was actually hospitalized.  An appendectomy was not performed.  He was advised that they would consider doing an appendectomy in 6 to 8 weeks per patient and his wife. ? ?No current facility-administered medications for this encounter. ? ?Current Outpatient Medications:  ?  acetaminophen (TYLENOL) 325 MG tablet, Take 2 tablets (650 mg total) by mouth every 6 (six) hours as needed for mild pain, fever or headache., Disp: 12 tablet, Rfl: 0 ?  amLODipine (NORVASC) 10 MG tablet, Take 10 mg by mouth daily., Disp: , Rfl:  ?  amoxicillin-clavulanate (AUGMENTIN) 875-125 MG tablet, Take 1 tablet by mouth 2 (two) times daily for 7 days., Disp: 14 tablet, Rfl: 0 ?  aspirin EC 81 MG tablet, Take 1 tablet (81 mg total) by mouth daily with breakfast., Disp: 30 tablet, Rfl: 11 ?  atorvastatin (LIPITOR) 80 MG tablet, Take 80 mg by mouth daily., Disp: , Rfl:  ?  empagliflozin (JARDIANCE) 25 MG TABS tablet, Take 1 tablet by mouth daily., Disp: , Rfl:  ?  glipiZIDE (GLUCOTROL) 5 MG tablet, Take 5 mg by mouth 2 (two) times daily before a meal., Disp: , Rfl:  ?  lisinopril (PRINIVIL,ZESTRIL) 40 MG tablet, Take 40 mg by mouth daily., Disp: , Rfl:  ?  metFORMIN (GLUCOPHAGE-XR) 500 MG 24 hr tablet, Take 500 mg by mouth daily., Disp: , Rfl:  ?  oxyCODONE (OXY IR/ROXICODONE) 5 MG immediate release tablet, Take 1 tablet (5 mg total) by mouth every 6 (six) hours as needed for moderate pain., Disp: 10 tablet, Rfl: 0  ? ?Allergies  ?Allergen Reactions  ? Other   ?  Radiological/contrast Media  ? ? ?Past  Medical History:  ?Diagnosis Date  ? Diabetes mellitus without complication ([place])   ? Hypertension   ? Lung collapse   ?  ? ?Past Surgical History:  ?Procedure Laterality Date  ? SHOULDER ARTHROCENTESIS    ? ? ?History reviewed. No pertinent family history. ? ?Social History  ? ?Tobacco Use  ? Smoking status: Former  ? Smokeless tobacco: Never  ?Vaping Use  ? Vaping Use: Never used  ?Substance Use Topics  ? Alcohol use: Yes  ?  Comment: occassional  ? Drug use: Never  ? ? ?ROS ? ? ?Objective:  ? ?Vitals: ?BP 132/73 (BP Location: Right Arm)   Pulse (!) 114   Temp 97.9 ?F (36.6 ?C) (Oral)   Resp 18   SpO2 95%  ? ?Physical Exam ?Constitutional:   ?   General: He is not in acute distress. ?   Appearance: Normal appearance. He is well-developed and normal weight. He is not ill-appearing, toxic-appearing or diaphoretic.  ?HENT:  ?   Head: Normocephalic and atraumatic.  ?   Right Ear: External ear normal.  ?   Left Ear: External ear normal.  ?   Nose: Nose normal.  ?   Mouth/Throat:  ?   Pharynx: Oropharynx is clear.  ?Eyes:  ?   General: No scleral icterus.    ?   Right eye: No discharge.     ?  Left eye: No discharge.  ?   Extraocular Movements: Extraocular movements intact.  ?Cardiovascular:  ?   Rate and Rhythm: Normal rate.  ?Pulmonary:  ?   Effort: Pulmonary effort is normal.  ?Musculoskeletal:  ?   Cervical back: Rigidity, spasms, torticollis and tenderness present. No swelling, edema, deformity, erythema, signs of trauma, lacerations, bony tenderness or crepitus. Pain with movement present. Decreased range of motion.  ?Neurological:  ?   Mental Status: He is alert and oriented to person, place, and time.  ?Psychiatric:     ?   Mood and Affect: Mood normal.     ?   Behavior: Behavior normal.     ?   Thought Content: Thought content normal.     ?   Judgment: Judgment normal.  ? ? ?DG Cervical Spine Complete ? ?Result Date: 05/04/2021 ?CLINICAL DATA:  71 year old male with neck pain and stiffness for 1 week. Right  side pain. No recent injury. EXAM: CERVICAL SPINE - COMPLETE 4+ VIEW COMPARISON:  None. FINDINGS: Straightening of cervical lordosis with levoconvex scoliosis at the cervicothoracic junction and dextroconvex upper thoracic scoliosis. Normal prevertebral soft tissue contour. Mild degenerative appearing anterolisthesis at C7-T1. Moderate to severe disc space loss C4-C5 through C6-C7, most pronounced at the latter with bulky endplate spurring. Multilevel moderate right side facet hypertrophy. Bilateral posterior element alignment is within normal limits. No acute osseous abnormality identified. Multiple staple lines in the right lung apex. Left lung apex is clear. IMPRESSION: 1. No acute osseous abnormality identified in the cervical spine. 2. Scoliosis with advanced cervical degeneration C4-C5 through C6-C7. Facet arthropathy greater on the right. 3. Postoperative changes right lung apex. Electronically Signed   By: Genevie Ann M.D.   On: 05/04/2021 10:02   ? ? ?Assessment and Plan :  ? ?PDMP not reviewed this encounter. ? ?1. Torticollis   ?2. Degenerative disc disease, cervical   ?3. Type 2 diabetes mellitus treated without insulin ([place])   ?4. Acute appendicitis, unspecified acute appendicitis type   ? ?Patient has a lot of factors to consider.  He has ongoing appendicitis and did not have an appendectomy.  This poses a significant risk for him with steroid use.  However, he has severe neck pain with advanced degenerative disc disease.  As they have not done an appendectomy and per patient and his wife is not being considered at this time I offered an oral prednisone course to help him with his severe neck pain.  Recommended conservative management otherwise with Tylenol and tizanidine.  Follow-up with a spine specialist as soon as possible. Counseled patient on potential for adverse effects with medications prescribed/recommended today, ER and return-to-clinic precautions discussed, patient verbalized  understanding. ? ?  ?Jaynee Eagles, PA-C ?05/04/21 1018 ? ?

## 2021-05-06 ENCOUNTER — Encounter: Payer: Self-pay | Admitting: General Surgery

## 2021-05-06 ENCOUNTER — Telehealth: Payer: Self-pay | Admitting: *Deleted

## 2021-05-06 ENCOUNTER — Ambulatory Visit (INDEPENDENT_AMBULATORY_CARE_PROVIDER_SITE_OTHER): Payer: No Typology Code available for payment source | Admitting: General Surgery

## 2021-05-06 ENCOUNTER — Other Ambulatory Visit: Payer: Self-pay

## 2021-05-06 VITALS — BP 150/85 | HR 91 | Temp 98.7°F | Resp 16 | Ht 69.0 in | Wt 157.0 lb

## 2021-05-06 DIAGNOSIS — K3532 Acute appendicitis with perforation and localized peritonitis, without abscess: Secondary | ICD-10-CM

## 2021-05-06 DIAGNOSIS — M542 Cervicalgia: Secondary | ICD-10-CM

## 2021-05-06 NOTE — Patient Instructions (Signed)
Will see you back in 4-5 weeks to discuss options for removing the appendix given the perforation. ?Call if fevers, worsening belly pain, constipation. ?Ok to increase you diet.  ? ?Will send a message to PCP about your neck and them getting you referred to a spine specialist. If your neck is hurting that badly, will need to ensure nothing major before we do surgery given the neck manipulation done at that time.  ? ? ?

## 2021-05-06 NOTE — Telephone Encounter (Signed)
Late documentation for 05/05/2021: ? ?Received call from Dr. Shearon Stalls, PCP~ [phone] telephone~ [phone]~ fax. ? ?Requested patient discharge summary to be faxed for their records. Records sent.  ?

## 2021-05-06 NOTE — Progress Notes (Signed)
[facility] Note  ? ?HPI:  ?71 y.o. Male presents to clinic for follow-up evaluation of after perforated appendicitis managed with antibiotics. He is doing well and having no abdominal pain. He is eating and having Bms. Patient reports he is having significant neck pain and went to urgent care. They prescribed steroids and recommended spine specialist. He has not been referred yet due to being a [facility] patient.  ? ?Review of Systems:  ?Neck pain ?No numbness or weakness  ?All other review of systems: otherwise negative  ? ?Vital Signs:  ?BP (!) 150/85   Pulse 91   Temp 98.7 ?F (37.1 ?C) (Other (Comment))   Resp 16   Ht '5\' 9"'$  (1.753 m)   Wt 157 lb (71.2 kg)   SpO2 93%   BMI 23.18 kg/m?   ? ?Physical Exam:  ?Physical Exam ?Vitals reviewed.  ?Neck:  ?   Comments: Not really moving his neck, tenderness along the spine ?Cardiovascular:  ?   Rate and Rhythm: Normal rate.  ?Pulmonary:  ?   Effort: Pulmonary effort is normal.  ?Abdominal:  ?   General: There is no distension.  ?   Palpations: Abdomen is soft.  ?   Tenderness: There is no abdominal tenderness.  ?Musculoskeletal:  ?   Cervical back: Rigidity present. Spinous process tenderness present.  ?   Comments: 5/5 strength in upper and lower extremities, no numbness reported   ?Neurological:  ?   Mental Status: He is alert.  ? ?Imaging: ?CLINICAL DATA:  71 year old male with neck pain and stiffness for 1 ?week. Right side pain. No recent injury. ?  ?EXAM: ?CERVICAL SPINE - COMPLETE 4+ VIEW ?  ?COMPARISON:  None. ?  ?FINDINGS: ?Straightening of cervical lordosis with levoconvex scoliosis at the ?cervicothoracic junction and dextroconvex upper thoracic scoliosis. ?Normal prevertebral soft tissue contour. Mild degenerative appearing ?anterolisthesis at C7-T1. Moderate to severe disc space loss C4-C5 ?through C6-C7, most pronounced at the latter with bulky endplate ?spurring. Multilevel moderate right side facet hypertrophy. ?Bilateral posterior  element alignment is within normal limits. No ?acute osseous abnormality identified. Multiple staple lines in the ?right lung apex. Left lung apex is clear. ?  ?IMPRESSION: ?1. No acute osseous abnormality identified in the cervical spine. ?2. Scoliosis with advanced cervical degeneration C4-C5 through ?C6-C7. Facet arthropathy greater on the right. ?3. Postoperative changes right lung apex. ?  ?  ?Electronically Signed ?  By: Genevie Ann M.D. ?  On: 05/04/2021 10:02 ?  ? ? ?Assessment:  ?71 y.o. yo Male with perforated appendicitis that will need an interval appendectomy in 6-8 weeks. He is having neck pain and had a steroid taper but still is having pain. Warned about weakness and numbness as reasons to go the ED. ? ?Plan:  ?- Given that he will need anesthesia and extension of his neck ,we need to make sure there is nothing more concerning about his neck pain  ?- We forwarded info about the neck to the [facility] and have told the patient to call to get specialist referral from them as I am told I cannot refer him  ?- Diet as tolerated ?- Will see in 1 month to discuss options for appendectomy to prevent a recurrence of the complication appendicitis  ? ?Will see you back in 4-5 weeks to discuss options for removing the appendix given the perforation. ?Call if fevers, worsening belly pain, constipation. ?Ok to increase you diet.  ? ?Will send a message to PCP about your neck  and them getting you referred to a spine specialist. If your neck is hurting that badly, will need to ensure nothing major before we do surgery given the neck manipulation done at that time.  ? ?Future Appointments  ?Date Time Provider [place]  ?06/10/2021 10:45 AM [name], MD RS-RS None  ? ? ? ? ?Curlene Labrum, MD ?[facility] ?[address] [place], [place] [address] ?[phone] (office) ? ?

## 2021-05-15 ENCOUNTER — Other Ambulatory Visit: Payer: Self-pay | Admitting: *Deleted

## 2021-05-15 ENCOUNTER — Telehealth: Payer: Self-pay | Admitting: *Deleted

## 2021-05-15 MED ORDER — AMOXICILLIN-POT CLAVULANATE 875-125 MG PO TABS: 1.0000 | ORAL_TABLET | Freq: Two times a day (BID) | ORAL | 0 refills | Status: AC

## 2021-05-15 NOTE — Telephone Encounter (Signed)
Received call from patient daughter, Wyatt Portela [phone]. ? ?Patient noted on line with daughter. States that lower abdominal pain has worsened and he is concerned about appendix.  ? ?States that no fever noted, and patient continues to have normal BM's. States that he is having some urgency with BM for the last day.  ? ?Dr. Constance Haw made aware and new orders given as follows: ?Augmentin 875/125 mg PO BID x7 days ?CT Abdomen Pelvis w/ Contrast STAT ? ?Patient and daughter agreeable to plan. Orders placed. ?

## 2021-05-16 ENCOUNTER — Other Ambulatory Visit: Payer: Self-pay

## 2021-05-16 ENCOUNTER — Telehealth (INDEPENDENT_AMBULATORY_CARE_PROVIDER_SITE_OTHER): Payer: Non-veteran care | Admitting: General Surgery

## 2021-05-16 ENCOUNTER — Ambulatory Visit (HOSPITAL_COMMUNITY)
Admission: RE | Admit: 2021-05-16 | Discharge: 2021-05-16 | Disposition: A | Discharge status: Home / Self Care | Payer: No Typology Code available for payment source | Source: Ambulatory Visit | Attending: General Surgery | Admitting: General Surgery

## 2021-05-16 ENCOUNTER — Other Ambulatory Visit (HOSPITAL_COMMUNITY): Payer: Self-pay | Admitting: General Surgery

## 2021-05-16 DIAGNOSIS — K3532 Acute appendicitis with perforation and localized peritonitis, without abscess: Secondary | ICD-10-CM

## 2021-05-16 DIAGNOSIS — R1030 Lower abdominal pain, unspecified: Secondary | ICD-10-CM | POA: Insufficient documentation

## 2021-05-16 DIAGNOSIS — K3533 Acute appendicitis with perforation and localized peritonitis, with abscess: Secondary | ICD-10-CM

## 2021-05-16 DIAGNOSIS — R109 Unspecified abdominal pain: Secondary | ICD-10-CM | POA: Diagnosis present

## 2021-05-16 DIAGNOSIS — R1031 Right lower quadrant pain: Secondary | ICD-10-CM | POA: Insufficient documentation

## 2021-05-16 MED ORDER — IOHEXOL 9 MG/ML PO SOLN
ORAL | Status: AC
  Filled 2021-05-16: qty 1000

## 2021-05-16 MED ORDER — IOHEXOL 300 MG/ML  SOLN
100.0000 mL | Freq: Once | INTRAMUSCULAR | Status: AC | PRN
  Administered 2021-05-16: 100 mL via INTRAVENOUS

## 2021-05-16 NOTE — Telephone Encounter (Signed)
Talked to patient and told him about the labs and IR and told him to expect the office to call him Monday to make sure he knows what he is scheduled for too. ? ?Expressed understanding.  ? ?Curlene Labrum, MD ?[facility] ?[address], [place] [address] ?[phone] (office) ? ?

## 2021-05-16 NOTE — Telephone Encounter (Signed)
[facility] ? ?Talked to patient. He is feeling better overall. He says he started the antibiotics. ? ?He is eating and has no fevers. Told him about the abscesses. Talked Dr. Earleen Newport with IR and have talked to IR scheduling. Hopefully they can at least drain the pelvis.  ? ?IR guided drain placement at Tuesday at [facility] Entrance Admission, 05/20/21 at 1PM and nothing to eat or drink after 8AM and has to have a driver. He can take AM meds with sips of water.  ? ?24 hour supervision after the drain.  ? ?Will make sure my office calls and makes sure he understands what he is doing.  ? ?Labs on Monday at [place].  ?IR on Tuesday.  ?Go to the ED with worsening pain, fever, nausea or vomiting.  ? ?Tried to call patient back to let him know I got this scheduled. He needs to go on Tuesday. Left a voicemail.  ? ?Will call him back over weekend and have office call Monday.  ? ?Curlene Labrum, MD ?[facility] ?[address], [place] [address] ?[phone] (office) ? ?

## 2021-05-19 ENCOUNTER — Other Ambulatory Visit: Payer: Self-pay | Admitting: Radiology

## 2021-05-19 NOTE — Telephone Encounter (Signed)
Call placed to patient to review. Blake Rodriguez.  ? ?Call placed to patient daughter Blake Rodriguez to review. [place].  ?

## 2021-05-19 NOTE — Telephone Encounter (Signed)
Patient returned call and made aware.  ? ?Verbalized understanding.  ?

## 2021-05-20 ENCOUNTER — Other Ambulatory Visit: Payer: Self-pay

## 2021-05-20 ENCOUNTER — Observation Stay (HOSPITAL_COMMUNITY)
Admission: RE | Admit: 2021-05-20 | Discharge: 2021-05-21 | Disposition: A | Discharge status: Home / Self Care | Payer: Medicare (Managed Care) | Source: Ambulatory Visit | Attending: Interventional Radiology | Admitting: Interventional Radiology

## 2021-05-20 ENCOUNTER — Encounter (HOSPITAL_COMMUNITY): Payer: Self-pay

## 2021-05-20 ENCOUNTER — Other Ambulatory Visit: Payer: Self-pay | Admitting: Radiology

## 2021-05-20 DIAGNOSIS — Z7982 Long term (current) use of aspirin: Secondary | ICD-10-CM | POA: Diagnosis not present

## 2021-05-20 DIAGNOSIS — Z7984 Long term (current) use of oral hypoglycemic drugs: Secondary | ICD-10-CM | POA: Insufficient documentation

## 2021-05-20 DIAGNOSIS — I10 Essential (primary) hypertension: Secondary | ICD-10-CM | POA: Diagnosis not present

## 2021-05-20 DIAGNOSIS — Z79899 Other long term (current) drug therapy: Secondary | ICD-10-CM | POA: Insufficient documentation

## 2021-05-20 DIAGNOSIS — K5720 Diverticulitis of large intestine with perforation and abscess without bleeding: Secondary | ICD-10-CM | POA: Diagnosis present

## 2021-05-20 DIAGNOSIS — K3533 Acute appendicitis with perforation and localized peritonitis, with abscess: Secondary | ICD-10-CM | POA: Diagnosis not present

## 2021-05-20 DIAGNOSIS — Z87891 Personal history of nicotine dependence: Secondary | ICD-10-CM | POA: Insufficient documentation

## 2021-05-20 DIAGNOSIS — K3532 Acute appendicitis with perforation and localized peritonitis, without abscess: Secondary | ICD-10-CM

## 2021-05-20 DIAGNOSIS — E119 Type 2 diabetes mellitus without complications: Secondary | ICD-10-CM | POA: Diagnosis not present

## 2021-05-20 DIAGNOSIS — L0291 Cutaneous abscess, unspecified: Secondary | ICD-10-CM | POA: Insufficient documentation

## 2021-05-20 LAB — CBC WITH DIFFERENTIAL/PLATELET
Absolute Monocytes: 828 cells/uL (ref 200–950)
Basophils Absolute: 36 cells/uL (ref 0–200)
Basophils Relative: 0.3 %
Eosinophils Absolute: 36 cells/uL (ref 15–500)
Eosinophils Relative: 0.3 %
HCT: 41.6 % (ref 38.5–50.0)
Hemoglobin: 13.5 g/dL (ref 13.2–17.1)
Lymphs Abs: 1860 cells/uL (ref 850–3900)
MCH: 28.8 pg (ref 27.0–33.0)
MCHC: 32.5 g/dL (ref 32.0–36.0)
MCV: 88.7 fL (ref 80.0–100.0)
MPV: 10.8 fL (ref 7.5–12.5)
Monocytes Relative: 6.9 %
Neutro Abs: 9240 cells/uL — ABNORMAL HIGH (ref 1500–7800)
Neutrophils Relative %: 77 %
Platelets: 390 10*3/uL (ref 140–400)
RBC: 4.69 10*6/uL (ref 4.20–5.80)
RDW: 13.3 % (ref 11.0–15.0)
Total Lymphocyte: 15.5 %
WBC: 12 10*3/uL — ABNORMAL HIGH (ref 3.8–10.8)

## 2021-05-20 LAB — CBC
HCT: 41.5 % (ref 39.0–52.0)
Hemoglobin: 13.8 g/dL (ref 13.0–17.0)
MCH: 29.7 pg (ref 26.0–34.0)
MCHC: 33.3 g/dL (ref 30.0–36.0)
MCV: 89.4 fL (ref 80.0–100.0)
Platelets: 441 10*3/uL — ABNORMAL HIGH (ref 150–400)
RBC: 4.64 MIL/uL (ref 4.22–5.81)
RDW: 14.7 % (ref 11.5–15.5)
WBC: 12.3 10*3/uL — ABNORMAL HIGH (ref 4.0–10.5)
nRBC: 0 % (ref 0.0–0.2)

## 2021-05-20 LAB — BASIC METABOLIC PANEL
BUN/Creatinine Ratio: 11 (calc) (ref 6–22)
BUN: 17 mg/dL (ref 7–25)
CO2: 21 mmol/L (ref 20–32)
Calcium: 9.1 mg/dL (ref 8.6–10.3)
Chloride: 106 mmol/L (ref 98–110)
Creat: 1.49 mg/dL — ABNORMAL HIGH (ref 0.70–1.28)
Glucose, Bld: 157 mg/dL — ABNORMAL HIGH (ref 65–99)
Potassium: 4 mmol/L (ref 3.5–5.3)
Sodium: 141 mmol/L (ref 135–146)

## 2021-05-20 LAB — GLUCOSE, CAPILLARY
Glucose-Capillary: 133 mg/dL — ABNORMAL HIGH (ref 70–99)
Glucose-Capillary: 90 mg/dL (ref 70–99)

## 2021-05-20 LAB — PROTIME-INR
INR: 1 (ref 0.8–1.2)
Prothrombin Time: 13.6 seconds (ref 11.4–15.2)

## 2021-05-20 MED ORDER — MIDAZOLAM HCL 2 MG/2ML IJ SOLN
INTRAMUSCULAR | Status: AC
  Filled 2021-05-20: qty 6

## 2021-05-20 MED ORDER — SODIUM CHLORIDE 0.9% FLUSH
5.0000 mL | Freq: Three times a day (TID) | INTRAVENOUS | Status: DC
  Administered 2021-05-20 – 2021-05-21 (×2): 5 mL

## 2021-05-20 MED ORDER — FENTANYL CITRATE (PF) 100 MCG/2ML IJ SOLN
INTRAMUSCULAR | Status: AC | PRN
  Administered 2021-05-20 (×2): 50 ug via INTRAVENOUS

## 2021-05-20 MED ORDER — HYDROCODONE-ACETAMINOPHEN 5-325 MG PO TABS: 1.0000 | ORAL_TABLET | ORAL | Status: DC | PRN

## 2021-05-20 MED ORDER — MIDAZOLAM HCL 2 MG/2ML IJ SOLN
INTRAMUSCULAR | Status: AC | PRN
Start: 2021-05-20 — End: 2021-05-20
  Administered 2021-05-20 (×2): 1 mg via INTRAVENOUS

## 2021-05-20 MED ORDER — SODIUM CHLORIDE 0.9 % IV SOLN: INTRAVENOUS | Status: DC

## 2021-05-20 MED ORDER — FENTANYL CITRATE (PF) 100 MCG/2ML IJ SOLN
INTRAMUSCULAR | Status: AC
  Filled 2021-05-20: qty 4

## 2021-05-20 MED ORDER — LIDOCAINE HCL 1 % IJ SOLN
INTRAMUSCULAR | Status: AC
  Filled 2021-05-20: qty 10

## 2021-05-20 NOTE — Sedation Documentation (Signed)
Report called to Rod Holler, RN on 6N ?

## 2021-05-20 NOTE — Procedures (Signed)
Interventional Radiology Procedure Note ? ?Procedure: Placement of a right 45F transgluteal drain with aspiration of 40 mL thick purulent fluid.  Abscess cavity completely aspirated.  The previously seen 2nd abscess seems to have resolved.  ? ?Complications: None ? ?Estimated Blood Loss: None ? ?Recommendations: ?- Admit for obs ?- Flush drain Q shift ?- Drain education ?- Anticipate D/C tomorrow ? ? ?Signed, ? ?Criselda Peaches, MD ? ? ?

## 2021-05-20 NOTE — H&P (Signed)
? ?Chief Complaint: ?Patient was seen in consultation today for intra abdominal abscess drain placement at the request of Bridges,Lindsay C ? ?Referring Physician(s): ?Bridges,Lindsay C ? ?Supervising Physician: Jacqulynn Cadet ? ?Patient Status: [facility] - Out-pt ? ?History of Present Illness: ?Blake Rodriguez is a 71 y.o. male  ? ?Perforated appendix-- without abscess per DC Summary 04/28/21 from [facility] ?Follows with Dr Hayes Ludwig ?Antibiotic therapy ?Plan was for appendectomy in several weeks ? ?Developed more abd pain and fever ?3/16 CT:IMPRESSION: ?1. Interval development of multiple fluid collections in the pelvis ?which are now well organized and compatible with abscesses in the ?setting of perforated appendicitis. ?2. Abscesses abut the ileum, sigmoid colon and urinary bladder as ?discussed and also track into the upper rectoprostatic region. ?3. Secondary inflammation of bowel and urinary bladder. ?4. Stranding envelopment the mid sigmoid colon. ?  ?Dr Constance Haw discussed with Dr Earleen Newport--- ?Planned for OP drain placement  ?Probable overnight observation with DC in am ?Pt is aware ? ? ?Past Medical History:  ?Diagnosis Date  ? Diabetes mellitus without complication ([place])   ? Hypertension   ? Lung collapse   ? ? ?Past Surgical History:  ?Procedure Laterality Date  ? SHOULDER ARTHROCENTESIS    ? ? ?Allergies: ?Iodinated contrast media ? ?Medications: ?Prior to Admission medications   ?Medication Sig Start Date End Date Taking? Authorizing Provider  ?acetaminophen (TYLENOL) 325 MG tablet Take 2 tablets (650 mg total) by mouth every 6 (six) hours as needed for mild pain, fever or headache. 04/28/21  Yes Emokpae, Courage, MD  ?acetaminophen (TYLENOL) 500 MG tablet Take 1,000 mg by mouth every 6 (six) hours as needed for moderate pain.   Yes [provider]  ?Alogliptin Benzoate 12.5 MG TABS Take 12.5 mg by mouth daily.   Yes [provider]  ?amoxicillin-clavulanate (AUGMENTIN) 875-125 MG tablet Take 1  tablet by mouth 2 (two) times daily for 7 days. 05/15/21 05/22/21 Yes [name], MD  ?aspirin EC 81 MG tablet Take 1 tablet (81 mg total) by mouth daily with breakfast. 04/28/21  Yes Emokpae, Courage, MD  ?atorvastatin (LIPITOR) 80 MG tablet Take 80 mg by mouth daily. 08/19/17  Yes [provider]  ?Cholecalciferol (VITAMIN D3 PO) Take 2 capsules by mouth daily.   Yes [provider]  ?Cyanocobalamin (B-12 PO) Take 5 tablets by mouth daily.   Yes [provider]  ?empagliflozin (JARDIANCE) 25 MG TABS tablet Take 25 mg by mouth daily. 10/15/20  Yes [provider]  ?ezetimibe (ZETIA) 10 MG tablet Take 10 mg by mouth daily.   Yes [provider]  ?lisinopril (PRINIVIL,ZESTRIL) 40 MG tablet Take 40 mg by mouth daily. 08/03/17  Yes [provider]  ?metFORMIN (GLUCOPHAGE-XR) 500 MG 24 hr tablet Take 500 mg by mouth 2 (two) times daily. 08/23/17  Yes [provider]  ?NIFEdipine (PROCARDIA XL/NIFEDICAL-XL) 90 MG 24 hr tablet Take 90 mg by mouth daily.   Yes [provider]  ?tiZANidine (ZANAFLEX) 4 MG tablet Take 1 tablet (4 mg total) by mouth at bedtime. 05/04/21  Yes Jaynee Eagles, PA-C  ?oxyCODONE (OXY IR/ROXICODONE) 5 MG immediate release tablet Take 1 tablet (5 mg total) by mouth every 6 (six) hours as needed for moderate pain. ?Patient not taking: Reported on 05/19/2021 04/28/21   Roxan Hockey, MD  ?  ? ?History reviewed. No pertinent family history. ? ?Social History  ? ?Socioeconomic History  ? Marital status: Married  ?  Spouse name: Not on file  ?  Number of children: Not on file  ? Years of education: Not on file  ? Highest education level: Not on file  ?Occupational History  ? Not on file  ?Tobacco Use  ? Smoking status: Former  ? Smokeless tobacco: Never  ?Vaping Use  ? Vaping Use: Never used  ?Substance and Sexual Activity  ? Alcohol use: Yes  ?  Comment: occassional  ? Drug use: Never  ? Sexual activity: Not on file  ?Other Topics  Concern  ? Not on file  ?Social History Narrative  ? Not on file  ? ?Social Determinants of Health  ? ?Financial Resource Strain: Not on file  ?Food Insecurity: Not on file  ?Transportation Needs: Not on file  ?Physical Activity: Not on file  ?Stress: Not on file  ?Social Connections: Not on file  ? ? ? ?Review of Systems: A 12 point ROS discussed and pertinent positives are indicated in the HPI above.  All other systems are negative. ? ?Review of Systems  ?Constitutional:  Positive for unexpected weight change. Negative for activity change, fatigue and fever.  ?Respiratory:  Negative for cough and shortness of breath.   ?Cardiovascular:  Negative for chest pain.  ?Gastrointestinal:  Positive for abdominal pain.  ?Neurological:  Negative for weakness.  ?Psychiatric/Behavioral:  Negative for behavioral problems and confusion.   ? ?Vital Signs: ?BP 139/68   Pulse (!) 110   Temp 98.5 ?F (36.9 ?C) (Oral)   Resp 16   Ht '5\' 9"'$  (1.753 m)   Wt 152 lb (68.9 kg)   SpO2 98%   BMI 22.45 kg/m?  ? ?Physical Exam ?Vitals reviewed.  ?HENT:  ?   Mouth/Throat:  ?   Mouth: Mucous membranes are moist.  ?Cardiovascular:  ?   Rate and Rhythm: Normal rate and regular rhythm.  ?   Heart sounds: Normal heart sounds.  ?Pulmonary:  ?   Effort: Pulmonary effort is normal.  ?   Breath sounds: Normal breath sounds. No wheezing.  ?Abdominal:  ?   General: Abdomen is flat.  ?   Tenderness: There is no abdominal tenderness.  ?Musculoskeletal:     ?   General: Normal range of motion.  ?Skin: ?   General: Skin is warm.  ?Neurological:  ?   Mental Status: He is alert and oriented to person, place, and time.  ?Psychiatric:     ?   Behavior: Behavior normal.  ? ? ?Imaging: ?DG Cervical Spine Complete ? ?Result Date: 05/04/2021 ?CLINICAL DATA:  71 year old male with neck pain and stiffness for 1 week. Right side pain. No recent injury. EXAM: CERVICAL SPINE - COMPLETE 4+ VIEW COMPARISON:  None. FINDINGS: Straightening of cervical lordosis with  levoconvex scoliosis at the cervicothoracic junction and dextroconvex upper thoracic scoliosis. Normal prevertebral soft tissue contour. Mild degenerative appearing anterolisthesis at C7-T1. Moderate to severe disc space loss C4-C5 through C6-C7, most pronounced at the latter with bulky endplate spurring. Multilevel moderate right side facet hypertrophy. Bilateral posterior element alignment is within normal limits. No acute osseous abnormality identified. Multiple staple lines in the right lung apex. Left lung apex is clear. IMPRESSION: 1. No acute osseous abnormality identified in the cervical spine. 2. Scoliosis with advanced cervical degeneration C4-C5 through C6-C7. Facet arthropathy greater on the right. 3. Postoperative changes right lung apex. Electronically Signed   By: Genevie Ann M.D.   On: 05/04/2021 10:02  ? ?CT Abdomen Pelvis W Contrast ? ?Result Date: 05/16/2021 ?CLINICAL DATA:  A 71 year old male presents for evaluation  of LEFT lower quadrant pain with recent history of perforated appendicitis. EXAM: CT ABDOMEN AND PELVIS WITH CONTRAST TECHNIQUE: Multidetector CT imaging of the abdomen and pelvis was performed using the standard protocol following bolus administration of intravenous contrast. RADIATION DOSE REDUCTION: This exam was performed according to the departmental dose-optimization program which includes automated exposure control, adjustment of the mA and/or kV according to patient size and/or use of iterative reconstruction technique. CONTRAST:  182m OMNIPAQUE IOHEXOL 300 MG/ML  SOLN COMPARISON:  April 26, 2021. FINDINGS: Lower chest: Pulmonary emphysema. No consolidation. No pleural effusion. Hepatobiliary: No focal, suspicious hepatic lesion. No pericholecystic stranding. No biliary duct dilation. Portal vein is patent. Cyst in the RIGHT hepatic lobe measuring 18 mm. Pancreas: Normal, without mass, inflammation or ductal dilatation. Spleen: Normal. Adrenals/Urinary Tract: Adrenal glands are  normal. There are cysts in the bilateral kidneys which are unchanged. No hydronephrosis. No perinephric stranding. Ureters in close proximity to marked inflammation that is increased in the pelvis since the prior study

## 2021-05-21 DIAGNOSIS — K5720 Diverticulitis of large intestine with perforation and abscess without bleeding: Secondary | ICD-10-CM | POA: Diagnosis not present

## 2021-05-21 LAB — GLUCOSE, CAPILLARY: Glucose-Capillary: 186 mg/dL — ABNORMAL HIGH (ref 70–99)

## 2021-05-21 MED ORDER — SODIUM CHLORIDE 0.9% FLUSH: 10.0000 mL | Freq: Two times a day (BID) | INTRAVENOUS | 0 refills | Status: DC

## 2021-05-21 NOTE — Plan of Care (Signed)
?  Problem: Education: ?Goal: Knowledge of General Education information will improve ?Description: Including pain rating scale, medication(s)/side effects and non-pharmacologic comfort measures ?Outcome: Progressing ?  ?Problem: Health Behavior/Discharge Planning: ?Goal: Ability to manage health-related needs will improve ?Outcome: Progressing ?  ?Problem: Clinical Measurements: ?Goal: Ability to maintain clinical measurements within normal limits will improve ?Outcome: Completed/Met ?Goal: Will remain free from infection ?Outcome: Completed/Met ?Goal: Diagnostic test results will improve ?Outcome: Completed/Met ?Goal: Respiratory complications will improve ?Outcome: Completed/Met ?Goal: Cardiovascular complication will be avoided ?Outcome: Completed/Met ?  ?Problem: Activity: ?Goal: Risk for activity intolerance will decrease ?Outcome: Completed/Met ?  ?Problem: Nutrition: ?Goal: Adequate nutrition will be maintained ?Outcome: Completed/Met ?  ?Problem: Elimination: ?Goal: Will not experience complications related to bowel motility ?Outcome: Completed/Met ?Goal: Will not experience complications related to urinary retention ?Outcome: Completed/Met ?  ?Problem: Pain Managment: ?Goal: General experience of comfort will improve ?Outcome: Completed/Met ?  ?Problem: Safety: ?Goal: Ability to remain free from injury will improve ?Outcome: Completed/Met ?  ?Problem: Skin Integrity: ?Goal: Risk for impaired skin integrity will decrease ?Outcome: Progressing ?  ?

## 2021-05-21 NOTE — Discharge Summary (Signed)
? ? ?Patient ID: ?Blake Rodriguez ?MRN: [id number] ?DOB/AGE: 11-13-1950 71 y.o. ? ?Admit date: 05/20/2021 ?Discharge date: 05/21/2021 ? ?Supervising Physician: Arne Cleveland ? ?Patient Status: [facility] - In-pt ? ?Admission Diagnoses: Intra-abdominal fluid collection ? ?Discharge Diagnoses:  ?Principal Problem: ?  Colonic diverticular abscess ?Perforated appendicitis ? ?Discharged Condition: good ? ?Hospital Course: Joshue Badal is a 71 year old male with past medical history of DM, HTN who presented to [facility] with abdominal pain.  He was found to have perforated appendicitis and managed conservatively with antibiotics.  He developed worsening abdominal pain and fever at home.  Repeat CT Abdomen Pelvis 3/16 showed interval development of multiple fluid collections concerning for abscesses.  He was referred to IR for possible drain placement.  Upon arrival yesterday to IR, patient endorsed abdominal pain.  Two drains were considered, however his abdominal fluid collection appeared improved.  A right trans-gluteal pelvic drain was placed into a large fluid collection with return of pus.  Patient was admitted overnight for observation.  He is assessed this AM and found to be in stable condition.  He has improved abdominal pain and has not required pain medication.  His drain is intact wihtout issue.  It flushes and aspirates easily.  He states his daughter is planning to assist with drain care.  He has eaten breakfast with tolerance.  Has been able to ambulate in his room.  He is stable for discharge home.  He understands our schedulers will contact him with date and time of follow-up appointment for drain clinic.  He is instructed to continue care and antibiotics per the instruction of Dr. Constance Haw.  ? ?Discharge Exam: ?Blood pressure 130/82, pulse 89, temperature 98.1 ?F (36.7 ?C), temperature source Oral, resp. rate 16, height '5\' 9"'$  (1.753 m), weight 152 lb (68.9 kg), SpO2 98 %. ?General appearance: alert, cooperative, and no  distress ?Resp: clear to auscultation bilaterally ?Cardio: regular rate and rhythm, S1, S2 normal, no murmur, click, rub or gallop ?GI: soft, non-tender; bowel sounds normal; no masses,  no organomegaly ?Skin: Right trans-gluteal drain in place.  Site mildly tender as expected.  C/d/I.  Bloody output in bulb. Flushes and aspirates easily.  ? ?Disposition: Discharge disposition: 01-Home or Self Care ? ? ? ? ? ? ?Discharge Instructions   ? ? Call MD for:  persistant nausea and vomiting   Complete by: As directed ?  ? Call MD for:  redness, tenderness, or signs of infection (pain, swelling, redness, odor or green/yellow discharge around incision site)   Complete by: As directed ?  ? Call MD for:  severe uncontrolled pain   Complete by: As directed ?  ? Call MD for:  temperature >100.4   Complete by: As directed ?  ? Change dressing (specify)   Complete by: As directed ?  ? Dressing change: as needed to keep drain clean and dry.  ? Diet - low sodium heart healthy   Complete by: As directed ?  ? Discharge instructions   Complete by: As directed ?  ? Drain to remain in place at discharge.  ?Do no submerge drain in water. ?Flush catheter with 5-10 mL of saline 1-2 times daily.  ?Record output daily and bring log with you to follow-up appointments.  ?Schedulers will call to arrange follow-up with Interventional Radiology expected in 7-10 days.  ? Increase activity slowly   Complete by: As directed ?  ? ?  ? ?Allergies as of 05/21/2021   ?No Active Allergies ?  ? ?  ?  Medication List  ?  ? ?TAKE these medications   ? ?acetaminophen 500 MG tablet ?Commonly known as: TYLENOL ?Take 1,000 mg by mouth every 6 (six) hours as needed for moderate pain. ?  ?acetaminophen 325 MG tablet ?Commonly known as: TYLENOL ?Take 2 tablets (650 mg total) by mouth every 6 (six) hours as needed for mild pain, fever or headache. ?  ?Alogliptin Benzoate 12.5 MG Tabs ?Take 12.5 mg by mouth daily. ?  ?amoxicillin-clavulanate 875-125 MG tablet ?Commonly  known as: Augmentin ?Take 1 tablet by mouth 2 (two) times daily for 7 days. ?  ?aspirin EC 81 MG tablet ?Take 1 tablet (81 mg total) by mouth daily with breakfast. ?  ?atorvastatin 80 MG tablet ?Commonly known as: LIPITOR ?Take 80 mg by mouth daily. ?  ?B-12 PO ?Take 5 tablets by mouth daily. ?  ?empagliflozin 25 MG Tabs tablet ?Commonly known as: JARDIANCE ?Take 25 mg by mouth daily. ?  ?ezetimibe 10 MG tablet ?Commonly known as: ZETIA ?Take 10 mg by mouth daily. ?  ?lisinopril 40 MG tablet ?Commonly known as: ZESTRIL ?Take 40 mg by mouth daily. ?  ?metFORMIN 500 MG 24 hr tablet ?Commonly known as: GLUCOPHAGE-XR ?Take 500 mg by mouth 2 (two) times daily. ?  ?NIFEdipine 90 MG 24 hr tablet ?Commonly known as: PROCARDIA XL/NIFEDICAL-XL ?Take 90 mg by mouth daily. ?  ?oxyCODONE 5 MG immediate release tablet ?Commonly known as: Oxy IR/ROXICODONE ?Take 1 tablet (5 mg total) by mouth every 6 (six) hours as needed for moderate pain. ?  ?tiZANidine 4 MG tablet ?Commonly known as: Zanaflex ?Take 1 tablet (4 mg total) by mouth at bedtime. ?  ?VITAMIN D3 PO ?Take 2 capsules by mouth daily. ?  ? ?  ? ?  ?  ? ? ?  ?Discharge Care Instructions  ?(From admission, onward)  ?  ? ? ?  ? ?  Start     Ordered  ? 05/21/21 0000  Change dressing (specify)       ?Comments: Dressing change: as needed to keep drain clean and dry.  ? 05/21/21 1127  ? ?  ?  ? ?  ? ? Follow-up Information   ? ? Criselda Peaches, MD Follow up.   ?Specialties: Interventional Radiology, Radiology ?Why: Schedulers will contact you with date and time of follow-up appointment. ?Contact information: ?[address] ?[address] ?[place] [address] ?[phone] ? ? ?  ?  ? ?  ?  ? ?  ?  ? ?Electronically Signed: ?Docia Barrier, PA ?05/21/2021, 11:29 AM ? ? ?I have spent Greater Than 30 Minutes discharging Blake Rodriguez. ? ? ? ? ?

## 2021-05-21 NOTE — TOC Progression Note (Signed)
Transition of Care (TOC) - Progression Note  ? ? ?Patient Details  ?Name: Blake Rodriguez ?MRN: [id number] ?Date of Birth: 09-11-1950 ? ?Transition of Care (TOC) CM/SW Contact  ?Marilu Favre, RN ?Phone Number: ?05/21/2021, 11:12 AM ? ?Clinical Narrative:    ? ? ?Transition of Care (TOC) Screening Note ? ? ?Patient Details  ?Name: Blake Rodriguez ?Date of Birth: 1951-02-04 ? ? ?Patient discussed in progression rounds, nurse will provide drain care education  ? ? ?Transition of Care Department [facility]) has reviewed patient and no TOC needs have been identified at this time. We will continue to monitor patient advancement through interdisciplinary progression rounds. If new patient transition needs arise, please place a TOC consult. ?  ? ?  ?  ? ?Expected Discharge Plan and Services ?  ?  ?  ?  ?  ?                ?  ?  ?  ?  ?  ?  ?  ?  ?  ?  ? ? ?Social Determinants of Health (SDOH) Interventions ?  ? ?Readmission Risk Interventions ?   ? View : No data to display.  ?  ?  ?  ? ? ?

## 2021-05-21 NOTE — Progress Notes (Signed)
AVS and jp drain instructions given to patient, provided with supplies to take care of jp and dressing. ?

## 2021-05-23 ENCOUNTER — Other Ambulatory Visit: Payer: Self-pay | Admitting: General Surgery

## 2021-05-23 DIAGNOSIS — K3533 Acute appendicitis with perforation and localized peritonitis, with abscess: Secondary | ICD-10-CM

## 2021-05-25 LAB — AEROBIC/ANAEROBIC CULTURE W GRAM STAIN (SURGICAL/DEEP WOUND): Special Requests: NORMAL

## 2021-05-28 ENCOUNTER — Ambulatory Visit
Admission: RE | Admit: 2021-05-28 | Discharge: 2021-05-28 | Disposition: A | Discharge status: Home / Self Care | Payer: No Typology Code available for payment source | Source: Ambulatory Visit | Attending: Radiology | Admitting: Radiology

## 2021-05-28 ENCOUNTER — Other Ambulatory Visit: Payer: Self-pay | Admitting: Radiology

## 2021-05-28 ENCOUNTER — Ambulatory Visit
Admission: RE | Admit: 2021-05-28 | Discharge: 2021-05-28 | Disposition: A | Discharge status: Home / Self Care | Payer: No Typology Code available for payment source | Source: Ambulatory Visit | Attending: General Surgery | Admitting: General Surgery

## 2021-05-28 ENCOUNTER — Other Ambulatory Visit: Payer: Self-pay | Admitting: General Surgery

## 2021-05-28 DIAGNOSIS — K3533 Acute appendicitis with perforation and localized peritonitis, with abscess: Secondary | ICD-10-CM

## 2021-05-28 HISTORY — PX: IR RADIOLOGIST EVAL & MGMT: IMG5224

## 2021-05-28 MED ORDER — IOPAMIDOL (ISOVUE-300) INJECTION 61%
100.0000 mL | Freq: Once | INTRAVENOUS | Status: AC | PRN
  Administered 2021-05-28: 100 mL via INTRAVENOUS

## 2021-05-28 NOTE — Progress Notes (Signed)
? ?Referring Physician(s): ?Omohundro,Jennifer C ? ?Chief Complaint: ?The patient is seen in follow up today s/p intra-abdominal abscess drain on 3/21 ? ?History of present illness: ? ?Antolin Belsito is a 71 year old male with past medical history of DM, HTN who presented to [facility] with abdominal pain.  He was found to have perforated appendicitis and managed conservatively with antibiotics.  He developed worsening abdominal pain and fever at home.  Repeat CT Abdomen Pelvis 3/16 showed interval development of multiple fluid collections concerning for abscesses.   A right trans-gluteal pelvic drain was placed into a large fluid collection with return of pus.  Patient was admitted overnight for observation.   ?Pt has had help with drain care from wife and daughter.  They are flushing 59m 2x/day.  He continues to have 289mOP per day.  Reports taking daily antibiotic.  Denies fever, chills, N/V.  Has f/u appt with Dr. BrConstance Hawomorrow. ? ?Past Medical History:  ?Diagnosis Date  ? Diabetes mellitus without complication ([place]  ? Hypertension   ? Lung collapse   ? ? ?Past Surgical History:  ?Procedure Laterality Date  ? SHOULDER ARTHROCENTESIS    ? ? ?Allergies: ?Patient has no active allergies. ? ?Medications: ?Prior to Admission medications   ?Medication Sig Start Date End Date Taking? Authorizing Provider  ?acetaminophen (TYLENOL) 325 MG tablet Take 2 tablets (650 mg total) by mouth every 6 (six) hours as needed for mild pain, fever or headache. 04/28/21   EmRoxan HockeyMD  ?acetaminophen (TYLENOL) 500 MG tablet Take 1,000 mg by mouth every 6 (six) hours as needed for moderate pain.    [provider]  ?Alogliptin Benzoate 12.5 MG TABS Take 12.5 mg by mouth daily.    [provider]  ?aspirin EC 81 MG tablet Take 1 tablet (81 mg total) by mouth daily with breakfast. 04/28/21   EmRoxan HockeyMD  ?atorvastatin (LIPITOR) 80 MG tablet Take 80 mg by mouth daily. 08/19/17   [provider]   ?Cholecalciferol (VITAMIN D3 PO) Take 2 capsules by mouth daily.    [provider]  ?Cyanocobalamin (B-12 PO) Take 5 tablets by mouth daily.    [provider]  ?empagliflozin (JARDIANCE) 25 MG TABS tablet Take 25 mg by mouth daily. 10/15/20   [provider]  ?ezetimibe (ZETIA) 10 MG tablet Take 10 mg by mouth daily.    [provider]  ?lisinopril (PRINIVIL,ZESTRIL) 40 MG tablet Take 40 mg by mouth daily. 08/03/17   [provider]  ?metFORMIN (GLUCOPHAGE-XR) 500 MG 24 hr tablet Take 500 mg by mouth 2 (two) times daily. 08/23/17   [provider]  ?NIFEdipine (PROCARDIA XL/NIFEDICAL-XL) 90 MG 24 hr tablet Take 90 mg by mouth daily.    [provider]  ?oxyCODONE (OXY IR/ROXICODONE) 5 MG immediate release tablet Take 1 tablet (5 mg total) by mouth every 6 (six) hours as needed for moderate pain. ?Patient not taking: Reported on 05/19/2021 04/28/21   EmRoxan HockeyMD  ?sodium chloride flush (NS) 0.9 % SOLN Place 10 mLs into feeding tube 2 (two) times daily. Flush catheter with 5-10 mLs daily. 05/21/21   MaDocia BarrierPA  ?tiZANidine (ZANAFLEX) 4 MG tablet Take 1 tablet (4 mg total) by mouth at bedtime. 05/04/21   MaJaynee EaglesPA-C  ?  ? ?No family history on file. ? ?Social History  ? ?Socioeconomic History  ? Marital status: Married  ?  Spouse name: Not on file  ? Number of  children: Not on file  ? Years of education: Not on file  ? Highest education level: Not on file  ?Occupational History  ? Not on file  ?Tobacco Use  ? Smoking status: Former  ? Smokeless tobacco: Never  ?Vaping Use  ? Vaping Use: Never used  ?Substance and Sexual Activity  ? Alcohol use: Yes  ?  Comment: occassional  ? Drug use: Never  ? Sexual activity: Not on file  ?Other Topics Concern  ? Not on file  ?Social History Narrative  ? Not on file  ? ?Social Determinants of Health  ? ?Financial Resource Strain: Not on file  ?Food Insecurity: Not on file  ?Transportation  Needs: Not on file  ?Physical Activity: Not on file  ?Stress: Not on file  ?Social Connections: Not on file  ? ? ? ?Vital Signs: ?There were no vitals taken for this visit. ? ?Physical Exam ?Constitutional:   ?   General: He is not in acute distress. ?HENT:  ?   Head: Normocephalic and atraumatic.  ?   Mouth/Throat:  ?   Pharynx: Oropharynx is clear.  ?Pulmonary:  ?   Effort: Pulmonary effort is normal.  ?Abdominal:  ?   General: Abdomen is flat.  ?   Palpations: Abdomen is soft.  ?Skin: ?   General: Skin is warm and dry.  ?Neurological:  ?   General: No focal deficit present.  ?   Mental Status: He is alert.  ?Psychiatric:     ?   Behavior: Behavior normal.     ?   Thought Content: Thought content normal.  ? ? ?Imaging: ?No results found. ? ?Labs: ? ?CBC: ?Recent Labs  ?  04/27/21 ?0430 04/28/21 ?0427 05/19/21 ?1000 05/20/21 ?1313  ?WBC 14.0* 11.6* 12.0* 12.3*  ?HGB 14.5 12.5* 13.5 13.8  ?HCT 47.7 38.8* 41.6 41.5  ?PLT 185 163 390 441*  ? ? ?COAGS: ?Recent Labs  ?  05/20/21 ?1313  ?INR 1.0  ? ? ?BMP: ?Recent Labs  ?  04/26/21 ?0359 04/27/21 ?0430 04/28/21 ?0427 05/19/21 ?1000  ?NA 140 146* 138 141  ?K 3.8 4.3 3.3* 4.0  ?CL 107 113* 111 106  ?CO2 16* 20* 18* 21  ?GLUCOSE 176* 125* 133* 157*  ?BUN '21 23 18 17  '$ ?CALCIUM 9.1 8.7* 8.0* 9.1  ?CREATININE 1.59* 1.72* 1.40* 1.49*  ?GFRNONAA 47* 42* 54*  --   ? ? ?LIVER FUNCTION TESTS: ?Recent Labs  ?  04/26/21 ?0359 04/27/21 ?0430  ?BILITOT 1.7* 1.8*  ?AST 20 24  ?ALT 17 14  ?ALKPHOS 65 61  ?PROT 8.1 7.4  ?ALBUMIN 4.1 3.5  ? ? ?Assessment: ? 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 ?Will leave drain in place for now and plan for 2 week follow up appointment with injection only of drain.  Appointment can be canceled if Surgery decides to d/c drain prior to this date.   ?Pt instructed to continue drain care and antibiotic as well as keep f/u  appointments ? ?Signed: Pasty Spillers, PA ?05/28/2021, 2:14 PM ? ? ?Please refer to Dr. Aurea Graff attestation of this note for management and plan.  ? ? ? ? ?  ?

## 2021-05-29 ENCOUNTER — Other Ambulatory Visit: Payer: Self-pay

## 2021-05-29 ENCOUNTER — Ambulatory Visit (INDEPENDENT_AMBULATORY_CARE_PROVIDER_SITE_OTHER): Payer: No Typology Code available for payment source | Admitting: General Surgery

## 2021-05-29 ENCOUNTER — Encounter: Payer: Self-pay | Admitting: General Surgery

## 2021-05-29 VITALS — BP 157/81 | HR 73 | Temp 98.1°F | Resp 14 | Ht 69.0 in | Wt 151.0 lb

## 2021-05-29 DIAGNOSIS — R1030 Lower abdominal pain, unspecified: Secondary | ICD-10-CM

## 2021-05-29 DIAGNOSIS — K3533 Acute appendicitis with perforation and localized peritonitis, with abscess: Secondary | ICD-10-CM | POA: Diagnosis not present

## 2021-05-29 NOTE — Progress Notes (Signed)
[facility] ? ?Fistula on the IR drain study. Otherwise doing ok. Only SS output. Having no pain. Feels good. Eating and having Bms. ? ?BP (!) 157/81   Pulse 73   Temp 98.1 ?F (36.7 ?C) (Other (Comment))   Resp 14   Ht '5\' 9"'$  (1.753 m)   Wt 151 lb (68.5 kg)   SpO2 96%   BMI 22.30 kg/m?  ?Drain with SS output  ? ?Patient w/ IR drain after perforated appendicitis with abscess. Doing better. ?Dr. Dwaine Gale and I discussed, stop doing the flushes so we do not keep the fistula open ?Drain check with IR 4/12  ?I will see the next week and get plan for laparoscopic appendectomy in place ? ? ?Future Appointments  ?Date Time Provider [place]  ?06/11/2021  2:00 PM [facility] DG 1 ([facility]) [facility]  ?06/11/2021  2:00 PM [facility] IR [facility]  ?06/17/2021 10:45 AM Constance Haw, Lanell Matar, MD [facility] None  ? ?Curlene Labrum, MD ?[facility] ?[place], [place] [address] ?[phone] (office) ? ?

## 2021-05-29 NOTE — Patient Instructions (Addendum)
Will talk to IR and see if they think we should stop flushing the drain.  ? ?Will call you. ?Continue drain care for now.  ? ?

## 2021-05-29 NOTE — Progress Notes (Signed)
Let patient know Dr. Dwaine Gale is in agreement for him to stop doing his catheter flushes. Just keep the bulb and monitor the output daily.

## 2021-06-10 ENCOUNTER — Ambulatory Visit: Payer: Non-veteran care | Admitting: General Surgery

## 2021-06-11 ENCOUNTER — Ambulatory Visit
Admission: RE | Admit: 2021-06-11 | Discharge: 2021-06-11 | Disposition: A | Discharge status: Home / Self Care | Payer: No Typology Code available for payment source | Source: Ambulatory Visit | Attending: General Surgery | Admitting: General Surgery

## 2021-06-11 ENCOUNTER — Other Ambulatory Visit: Payer: No Typology Code available for payment source

## 2021-06-11 ENCOUNTER — Ambulatory Visit
Admission: RE | Admit: 2021-06-11 | Discharge: 2021-06-11 | Disposition: A | Discharge status: Home / Self Care | Payer: Medicare (Managed Care) | Source: Ambulatory Visit | Attending: Radiology | Admitting: Radiology

## 2021-06-11 DIAGNOSIS — K3533 Acute appendicitis with perforation and localized peritonitis, with abscess: Secondary | ICD-10-CM

## 2021-06-11 HISTORY — PX: IR RADIOLOGIST EVAL & MGMT: IMG5224

## 2021-06-11 NOTE — Progress Notes (Signed)
? ?Referring Physician(s): ?Dr Hayes Ludwig ? ?Chief Complaint: ?The patient is seen in follow up today s/p periappendiceal abscess ?status post drain placement on 05/20/2021  ? ?History of present illness: ? 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 ? ?Was seen by Dr Constance Haw 3/30 ?Plan for lap appendectomy at some point ?Has follow up with her 06/17/21 ? ?He denies pain; fever; chills ?Denies N/V ?OP is scant since last visit ? ?Injection today does NOT show fistula per Dr Dwaine Gale ? ? ?Past Medical History:  ?Diagnosis Date  ? Diabetes mellitus without complication ([place])   ? Hypertension   ? Lung collapse   ? ? ?Past Surgical History:  ?Procedure Laterality Date  ? IR RADIOLOGIST EVAL & MGMT  05/28/2021  ? IR RADIOLOGIST EVAL & MGMT  06/11/2021  ? SHOULDER ARTHROCENTESIS    ? ? ?Allergies: ?Patient has no active allergies. ? ?Medications: ?Prior to Admission medications   ?Medication Sig Start Date End Date Taking? Authorizing Provider  ?acetaminophen (TYLENOL) 325 MG tablet Take 2 tablets (650 mg total) by mouth every 6 (six) hours as needed for mild pain, fever or headache. 04/28/21   Roxan Hockey, MD  ?acetaminophen (TYLENOL) 500 MG tablet Take 1,000 mg by mouth every 6 (six) hours as needed for moderate pain.    [provider]  ?Alogliptin Benzoate 12.5 MG TABS Take 12.5 mg by mouth daily.    [provider]  ?aspirin EC 81 MG tablet Take 1 tablet (81 mg total) by mouth daily with breakfast. 04/28/21   Roxan Hockey, MD  ?atorvastatin (LIPITOR) 80 MG tablet Take 80 mg by mouth daily. 08/19/17   [provider]  ?Cholecalciferol (VITAMIN D3 PO) Take 2 capsules by mouth daily.    [provider]  ?Cyanocobalamin (B-12 PO) Take 5 tablets by mouth daily.    [provider]  ?empagliflozin (JARDIANCE) 25 MG TABS tablet Take 25 mg by mouth daily. 10/15/20    [provider]  ?ezetimibe (ZETIA) 10 MG tablet Take 10 mg by mouth daily.    [provider]  ?lisinopril (PRINIVIL,ZESTRIL) 40 MG tablet Take 40 mg by mouth daily. 08/03/17   [provider]  ?metFORMIN (GLUCOPHAGE-XR) 500 MG 24 hr tablet Take 500 mg by mouth 2 (two) times daily. 08/23/17   [provider]  ?NIFEdipine (PROCARDIA XL/NIFEDICAL-XL) 90 MG 24 hr tablet Take 90 mg by mouth daily.    [provider]  ?oxyCODONE (OXY IR/ROXICODONE) 5 MG immediate release tablet Take 1 tablet (5 mg total) by mouth every 6 (six) hours as needed for moderate pain. 04/28/21   Roxan Hockey, MD  ?sodium chloride flush (NS) 0.9 % SOLN Place 10 mLs into feeding tube 2 (two) times daily. Flush catheter with 5-10 mLs daily. 05/21/21   Docia Barrier, PA  ?tiZANidine (ZANAFLEX) 4 MG tablet Take 1 tablet (4 mg total) by mouth at bedtime. 05/04/21   Jaynee Eagles, PA-C  ?  ? ?No family history on file. ? ?Social History  ? ?Socioeconomic History  ? Marital status: Married  ?  Spouse name: Not on file  ? Number of children: Not on file  ? Years of education: Not on file  ? Highest education level: Not on file  ?Occupational History  ? Not on file  ?Tobacco Use  ? Smoking status: Former  ? Smokeless tobacco: Never  ?Vaping Use  ?  Vaping Use: Never used  ?Substance and Sexual Activity  ? Alcohol use: Yes  ?  Comment: occassional  ? Drug use: Never  ? Sexual activity: Not on file  ?Other Topics Concern  ? Not on file  ?Social History Narrative  ? Not on file  ? ?Social Determinants of Health  ? ?Financial Resource Strain: Not on file  ?Food Insecurity: Not on file  ?Transportation Needs: Not on file  ?Physical Activity: Not on file  ?Stress: Not on file  ?Social Connections: Not on file  ? ? ? ?Vital Signs: ?BP 136/61   Pulse 88   Temp 98 ?F (36.7 ?C)   SpO2 98%  ? ?Physical Exam ?Vitals reviewed.  ?Skin: ?   General: Skin is warm.  ?   Comments: Site of drain is clean and dry ?NT no  bleeding ?No sign of infection ? ?Injection shows NO fistula ?Drain removal per Dr Mir ?Removal without complication ?New dressing placed ?  ? ? ?Imaging: ?IR Radiologist Eval & Mgmt ? ?Result Date: 06/11/2021 ?Please refer to notes tab for details about interventional procedure. (Op Note)  ? ?Labs: ? ?CBC: ?Recent Labs  ?  04/27/21 ?0430 04/28/21 ?0427 05/19/21 ?1000 05/20/21 ?1313  ?WBC 14.0* 11.6* 12.0* 12.3*  ?HGB 14.5 12.5* 13.5 13.8  ?HCT 47.7 38.8* 41.6 41.5  ?PLT 185 163 390 441*  ? ? ?COAGS: ?Recent Labs  ?  05/20/21 ?1313  ?INR 1.0  ? ? ?BMP: ?Recent Labs  ?  04/26/21 ?0359 04/27/21 ?0430 04/28/21 ?0427 05/19/21 ?1000  ?NA 140 146* 138 141  ?K 3.8 4.3 3.3* 4.0  ?CL 107 113* 111 106  ?CO2 16* 20* 18* 21  ?GLUCOSE 176* 125* 133* 157*  ?BUN '21 23 18 17  '$ ?CALCIUM 9.1 8.7* 8.0* 9.1  ?CREATININE 1.59* 1.72* 1.40* 1.49*  ?GFRNONAA 47* 42* 54*  --   ? ? ?LIVER FUNCTION TESTS: ?Recent Labs  ?  04/26/21 ?0359 04/27/21 ?0430  ?BILITOT 1.7* 1.8*  ?AST 20 24  ?ALT 17 14  ?ALKPHOS 65 61  ?PROT 8.1 7.4  ?ALBUMIN 4.1 3.5  ? ? ?Assessment: ? ?Perforated appendix ?Drain placed 3/21 ?+fistula 3/29; re injection today shows NO fistula ?Follows with Dr Constance Haw-- next appt 06/17/21 ?Drain removal without complication today ? ? ? ?Signed: ?Lavonia Drafts, PA-C ?06/11/2021, 2:19 PM ? ? ?Please refer to Dr. Dwaine Gale attestation of this note for management and plan.  ? ? ? ? ?  ?

## 2021-06-17 ENCOUNTER — Other Ambulatory Visit: Payer: Self-pay

## 2021-06-17 ENCOUNTER — Ambulatory Visit (INDEPENDENT_AMBULATORY_CARE_PROVIDER_SITE_OTHER): Payer: No Typology Code available for payment source | Admitting: General Surgery

## 2021-06-17 ENCOUNTER — Encounter: Payer: Self-pay | Admitting: General Surgery

## 2021-06-17 VITALS — BP 161/80 | HR 86 | Temp 97.7°F | Resp 16 | Ht 69.0 in | Wt 156.0 lb

## 2021-06-17 DIAGNOSIS — K3533 Acute appendicitis with perforation and localized peritonitis, with abscess: Secondary | ICD-10-CM

## 2021-06-17 DIAGNOSIS — K3532 Acute appendicitis with perforation and localized peritonitis, without abscess: Secondary | ICD-10-CM

## 2021-06-17 NOTE — Patient Instructions (Addendum)
Colon preparation, pending need for any larger surgery:  ?Buy from the Store: ?Miralax bottle (288g).  ?Gatorade 64 oz (not red). ?Dulcolax tablets.  ? ?The Day Prior to Surgery: ?Take 4 ducolax tablets at 7am with water. ?Drink plenty of clear liquids all day to avoid dehydration, no solid food.  ?  ?Mix the bottle of Miralax and 64 oz of Gatorade and drink this mixture starting at 10am. Drink it gradually over the next few hours, 8 ounces every 15-30 minutes until it is gone. Finish this by 2pm.  ? ? ?Laparoscopic Appendectomy, Adult ? ?A laparoscopic appendectomy is a surgery to take out the appendix. The appendix is a finger-like structure that is attached to the large intestine. This procedure may be done to prevent an inflamed appendix from bursting (rupturing). It may also be done to treat the infection from an appendix that has ruptured. It is often done right after appendicitis is diagnosed. Appendicitis is inflammation of the appendix. ?In this surgery, your health care provider uses a thin, lighted tube with a camera (laparoscope) to take out the appendix through three small incisions. This is a minimally invasive surgery. It usually results in less pain, fewer problems, and a quicker recovery than surgery done through a large incision (open appendectomy). ?Tell a health care provider about: ?Any allergies you have. ?All medicines you are taking, including vitamins, herbs, eye drops, creams, and over-the-counter medicines. ?Any steroid use. This includes creams or steroids you take by mouth. ?Any problems you or family members have had with anesthetic medicines. ?Any bleeding problems you have. ?Any surgeries you have had. ?Any medical conditions you have. ?Whether you are pregnant or may be pregnant. ?What are the risks? ?Generally, this is a safe procedure. However, problems may occur, including: ?Infection. ?Bleeding. ?Damage to nearby structures or organs. ?Allergic reactions to medicines. ?A  collection of pus (abscess). ?Blood clots in the legs. ?What happens before the procedure? ?When to stop eating and drinking ?Follow instructions from your health care provider about eating and drinking restrictions. You may be asked not to eat or drink as soon as the diagnosis of appendicitis is made. ?Medicines ?Ask your health care provider about: ?Changing or stopping your regular medicines. This is especially important if you are taking diabetes medicines or blood thinners. ?Taking medicines such as aspirin and ibuprofen. These medicines can thin your blood. Do not take these medicines unless your health care provider tells you to take them. ?Taking over-the-counter medicines, vitamins, herbs, and supplements. ?General instructions ?If you will be going home right after the procedure, plan to have a responsible adult: ?Take you home from the hospital or clinic. You will not be allowed to drive. ?Care for you for the time you are told. ?Ask your health care provider: ?How your surgery site will be marked. ?What steps will be taken to help prevent infection. These steps may include: ?Removing hair at the surgery site. ?Washing skin with a germ-killing soap. ?Taking antibiotic medicine. ?What happens during the procedure? ? ?An IV will be inserted into one of your veins. ?You will be given one or more of the following: ?A medicine to help you relax (sedative). ?A medicine to numb the area (local anesthetic). ?A medicine to make you fall asleep (general anesthetic). ?A thin, flexible tube (catheter) may be put into your bladder to drain urine. ?A tube may be passed through your nose or mouth and into your stomach (orogastric or nasogastric tube) to drain any stomach contents. ?Your Psychologist, sport and exercise  will make three small incisions near your belly button (navel). ?A gas (carbon dioxide) will be used to fill your abdomen. The gas will make your abdomen expand. This helps the surgeon see clearly and gives him or her more room  to work. ?A laparoscope will be passed through one of the incisions. Other surgical instruments will be passed through the other incisions to assist in surgery. ?The appendix will be located and removed through one of the incisions. ?The abdomen may be washed out to remove bacteria. ?The incisions will be closed with stitches (sutures), staples, or adhesive strips. ?A bandage (dressing) may be used to cover the incisions. ?If a tube was inserted into your bladder or stomach, it will be removed. ?The procedure may vary among health care providers and hospitals. ?What happens after the procedure? ?Your blood pressure, heart rate, breathing rate, and blood oxygen level will be monitored until you leave the hospital or clinic. ?You will be given medicines as needed to control pain and infection. ?If you were given a sedative during the procedure, it can affect you for several hours. Do not drive or operate machinery until your health care provider says that it is safe. ?If your appendix did not rupture, you may be able to go home the same day after your surgery. ?If your appendix ruptured: ?You will get antibiotic medicine through an IV line. ?You may be sent home with a temporary drain. ?Summary ?A laparoscopic appendectomy is a surgery to take out the appendix. The appendix is removed through three small incisions with the help of a thin, lighted tube that has a camera (laparoscope). ?This is a safe procedure, but there are some risks. Risks include bleeding, infection, allergic reaction to medicines, or damage to nearby organs. ?After the procedure, your blood pressure, heart rate, breathing rate, and blood oxygen level will be monitored until you leave the hospital or clinic. ?You will be given medicines as needed to control pain and infection. ?This information is not intended to replace advice given to you by your health care provider. Make sure you discuss any questions you have with your health care  provider. ?Document Revised: 11/28/2020 Document Reviewed: 11/28/2020 ?Elsevier Patient Education ? [place]. ? ?

## 2021-06-17 NOTE — Progress Notes (Signed)
[name] History and Physical ? ? ?Chief Complaint   ?Follow-up ?  ? ? ?Blake Rodriguez is a 71 y.o. male.  ?HPI: Blake Rodriguez is a 71 yo with a perforated appendicitis that required IR drain. He is doing much better and had his IR drain removed. He says he has no pain and is eating and having regular Bms. He wants to proceed with getting his appendix out given the perforation. The neck pain he had been having is much improved, and he is suppose to get see a chiropractor. He says he has no issues with strength or numbness in the arms the pain was just at his neck.  ? ?Past Medical History:  ?Diagnosis Date  ? Diabetes mellitus without complication ([place])   ? Hypertension   ? Lung collapse   ? ? ?Past Surgical History:  ?Procedure Laterality Date  ? IR RADIOLOGIST EVAL & MGMT  05/28/2021  ? IR RADIOLOGIST EVAL & MGMT  06/11/2021  ? SHOULDER ARTHROCENTESIS    ? ? ?History reviewed. No pertinent family history. ? ?Social History  ? ?Tobacco Use  ? Smoking status: Former  ? Smokeless tobacco: Never  ?Vaping Use  ? Vaping Use: Never used  ?Substance Use Topics  ? Alcohol use: Yes  ?  Comment: occassional  ? Drug use: Never  ? ? ?Medications: I have reviewed the patient's current medications. ?Allergies as of 06/17/2021   ?No Known Allergies ?  ? ?  ?Medication List  ?  ? ?  ? Accurate as of June 17, 2021 11:59 PM. If you have any questions, ask your nurse or doctor.  ?  ?  ? ?  ? ?STOP taking these medications   ? ?oxyCODONE 5 MG immediate release tablet ?Commonly known as: Oxy IR/ROXICODONE ?Stopped by: [name], MD ?  ? ?  ? ?TAKE these medications   ? ?acetaminophen 500 MG tablet ?Commonly known as: TYLENOL ?Take 1,000 mg by mouth every 6 (six) hours as needed for moderate pain. ?  ?acetaminophen 325 MG tablet ?Commonly known as: TYLENOL ?Take 2 tablets (650 mg total) by mouth every 6 (six) hours as needed for mild pain, fever or headache. ?  ?Alogliptin Benzoate 12.5 MG Tabs ?Take 12.5 mg by  mouth daily. ?  ?aspirin EC 81 MG tablet ?Take 1 tablet (81 mg total) by mouth daily with breakfast. ?  ?atorvastatin 80 MG tablet ?Commonly known as: LIPITOR ?Take 80 mg by mouth daily. ?  ?B-12 PO ?Take 5 tablets by mouth daily. ?  ?empagliflozin 25 MG Tabs tablet ?Commonly known as: JARDIANCE ?Take 25 mg by mouth daily. ?  ?ezetimibe 10 MG tablet ?Commonly known as: ZETIA ?Take 10 mg by mouth daily. ?  ?lisinopril 40 MG tablet ?Commonly known as: ZESTRIL ?Take 40 mg by mouth daily. ?  ?metFORMIN 500 MG 24 hr tablet ?Commonly known as: GLUCOPHAGE-XR ?Take 500 mg by mouth 2 (two) times daily. ?  ?NIFEdipine 90 MG 24 hr tablet ?Commonly known as: PROCARDIA XL/NIFEDICAL-XL ?Take 90 mg by mouth daily. ?  ?sodium chloride flush 0.9 % Soln ?Commonly known as: NS ?Place 10 mLs into feeding tube 2 (two) times daily. Flush catheter with 5-10 mLs daily. ?  ?tiZANidine 4 MG tablet ?Commonly known as: Zanaflex ?Take 1 tablet (4 mg total) by mouth at bedtime. ?  ?VITAMIN D3 PO ?Take 2 capsules by mouth daily. ?  ? ?  ? ? ? ?ROS:  ?A comprehensive review of systems was  negative. ? ?Blood pressure (!) 161/80, pulse 86, temperature 97.7 ?F (36.5 ?C), temperature source Oral, resp. rate 16, height '5\' 9"'$  (1.753 m), weight 156 lb (70.8 kg), SpO2 95 %. ?Physical Exam ?Vitals reviewed.  ?Constitutional:   ?   Appearance: Normal appearance.  ?HENT:  ?   Head: Normocephalic.  ?   Mouth/Throat:  ?   Mouth: Mucous membranes are moist.  ?Eyes:  ?   Extraocular Movements: Extraocular movements intact.  ?Cardiovascular:  ?   Rate and Rhythm: Normal rate and regular rhythm.  ?Pulmonary:  ?   Effort: Pulmonary effort is normal.  ?Abdominal:  ?   General: There is no distension.  ?   Palpations: Abdomen is soft.  ?   Tenderness: There is no abdominal tenderness.  ?Musculoskeletal:     ?   General: Normal range of motion.  ?   Cervical back: Normal range of motion.  ?Skin: ?   General: Skin is warm.  ?Neurological:  ?   General: No focal deficit  present.  ?   Mental Status: He is alert and oriented to person, place, and time.  ?Psychiatric:     ?   Mood and Affect: Mood normal.     ?   Behavior: Behavior normal.  ? ? ?Results: ?Personally Reviewed IR Fistulalogram- no abscess or fistula  ? ? ?Assessment & Plan:  ?Blake Rodriguez is a 71 y.o. male with perforated appendicitis. He is ready to get the interval appendectomy after discussing the option of not operating but that this could happen again or this could be a cancer.  ?-Discussed the risk of laparoscopic appendectomy. Discussed risk of surgery including but not limited to bleeding, infection, injury to other organs, normal appendix, and after this discussion the patient has decided to proceed with proceed. Discussed potential need for a larger surgery and that he will do a miralax bowel preparation to ensure that his colon is clean pending any larger surgery needs.  ? ?Buy from the Store: ?Miralax bottle (288g).  ?Gatorade 64 oz (not red). ?Dulcolax tablets.  ? ?The Day Prior to Surgery: ?Take 4 ducolax tablets at 7am with water. ?Drink plenty of clear liquids all day to avoid dehydration, no solid food.  ?  ?Mix the bottle of Miralax and 64 oz of Gatorade and drink this mixture starting at 10am. Drink it gradually over the next few hours, 8 ounces every 15-30 minutes until it is gone. Finish this by 2pm.  ? ?All questions were answered to the satisfaction of the patient. ? ? ? ?[name] ?06/18/2021, 9:22 AM  ? ? ? ? ? ?

## 2021-06-18 NOTE — H&P (Signed)
[name] History and Physical ? ? ?Chief Complaint   ?Follow-up ?  ? ? ?Blake Rodriguez is a 71 y.o. male.  ?HPI: Blake Rodriguez is a 71 yo with a perforated appendicitis that required IR drain. Blake Rodriguez is doing much better and had his IR drain removed. Blake Rodriguez says Blake Rodriguez has no pain and is eating and having regular Bms. Blake Rodriguez wants to proceed with getting his appendix out given the perforation. The neck pain Blake Rodriguez had been having is much improved, and Blake Rodriguez is suppose to get see a chiropractor. Blake Rodriguez says Blake Rodriguez has no issues with strength or numbness in the arms the pain was just at his neck.  ? ?Past Medical History:  ?Diagnosis Date  ? Diabetes mellitus without complication ([place])   ? Hypertension   ? Lung collapse   ? ? ?Past Surgical History:  ?Procedure Laterality Date  ? IR RADIOLOGIST EVAL & MGMT  05/28/2021  ? IR RADIOLOGIST EVAL & MGMT  06/11/2021  ? SHOULDER ARTHROCENTESIS    ? ? ?History reviewed. No pertinent family history. ? ?Social History  ? ?Tobacco Use  ? Smoking status: Former  ? Smokeless tobacco: Never  ?Vaping Use  ? Vaping Use: Never used  ?Substance Use Topics  ? Alcohol use: Yes  ?  Comment: occassional  ? Drug use: Never  ? ? ?Medications: I have reviewed the patient's current medications. ?Allergies as of 06/17/2021   ?No Known Allergies ?  ? ?  ?Medication List  ?  ? ?  ? Accurate as of June 17, 2021 11:59 PM. If you have any questions, ask your nurse or doctor.  ?  ?  ? ?  ? ?STOP taking these medications   ? ?oxyCODONE 5 MG immediate release tablet ?Commonly known as: Oxy IR/ROXICODONE ?Stopped by: [name], MD ?  ? ?  ? ?TAKE these medications   ? ?acetaminophen 500 MG tablet ?Commonly known as: TYLENOL ?Take 1,000 mg by mouth every 6 (six) hours as needed for moderate pain. ?  ?acetaminophen 325 MG tablet ?Commonly known as: TYLENOL ?Take 2 tablets (650 mg total) by mouth every 6 (six) hours as needed for mild pain, fever or headache. ?  ?Alogliptin Benzoate 12.5 MG Tabs ?Take 12.5 mg by  mouth daily. ?  ?aspirin EC 81 MG tablet ?Take 1 tablet (81 mg total) by mouth daily with breakfast. ?  ?atorvastatin 80 MG tablet ?Commonly known as: LIPITOR ?Take 80 mg by mouth daily. ?  ?B-12 PO ?Take 5 tablets by mouth daily. ?  ?empagliflozin 25 MG Tabs tablet ?Commonly known as: JARDIANCE ?Take 25 mg by mouth daily. ?  ?ezetimibe 10 MG tablet ?Commonly known as: ZETIA ?Take 10 mg by mouth daily. ?  ?lisinopril 40 MG tablet ?Commonly known as: ZESTRIL ?Take 40 mg by mouth daily. ?  ?metFORMIN 500 MG 24 hr tablet ?Commonly known as: GLUCOPHAGE-XR ?Take 500 mg by mouth 2 (two) times daily. ?  ?NIFEdipine 90 MG 24 hr tablet ?Commonly known as: PROCARDIA XL/NIFEDICAL-XL ?Take 90 mg by mouth daily. ?  ?sodium chloride flush 0.9 % Soln ?Commonly known as: NS ?Place 10 mLs into feeding tube 2 (two) times daily. Flush catheter with 5-10 mLs daily. ?  ?tiZANidine 4 MG tablet ?Commonly known as: Zanaflex ?Take 1 tablet (4 mg total) by mouth at bedtime. ?  ?VITAMIN D3 PO ?Take 2 capsules by mouth daily. ?  ? ?  ? ? ? ?ROS:  ?A comprehensive review of systems was  negative. ? ?Blood pressure (!) 161/80, pulse 86, temperature 97.7 ?F (36.5 ?C), temperature source Oral, resp. rate 16, height '5\' 9"'$  (1.753 m), weight 156 lb (70.8 kg), SpO2 95 %. ?Physical Exam ?Vitals reviewed.  ?Constitutional:   ?   Appearance: Normal appearance.  ?HENT:  ?   Head: Normocephalic.  ?   Mouth/Throat:  ?   Mouth: Mucous membranes are moist.  ?Eyes:  ?   Extraocular Movements: Extraocular movements intact.  ?Cardiovascular:  ?   Rate and Rhythm: Normal rate and regular rhythm.  ?Pulmonary:  ?   Effort: Pulmonary effort is normal.  ?Abdominal:  ?   General: There is no distension.  ?   Palpations: Abdomen is soft.  ?   Tenderness: There is no abdominal tenderness.  ?Musculoskeletal:     ?   General: Normal range of motion.  ?   Cervical back: Normal range of motion.  ?Skin: ?   General: Skin is warm.  ?Neurological:  ?   General: No focal deficit  present.  ?   Mental Status: Blake Rodriguez is alert and oriented to person, place, and time.  ?Psychiatric:     ?   Mood and Affect: Mood normal.     ?   Behavior: Behavior normal.  ? ? ?Results: ?Personally Reviewed IR Fistulalogram- no abscess or fistula  ? ? ?Assessment & Plan:  ?Blake Rodriguez is a 71 y.o. male with perforated appendicitis. Blake Rodriguez is ready to get the interval appendectomy after discussing the option of not operating but that this could happen again or this could be a cancer.  ?-Discussed the risk of laparoscopic appendectomy. Discussed risk of surgery including but not limited to bleeding, infection, injury to other organs, normal appendix, and after this discussion the patient has decided to proceed with proceed. Discussed potential need for a larger surgery and that Blake Rodriguez will do a miralax bowel preparation to ensure that his colon is clean pending any larger surgery needs.  ? ?Buy from the Store: ?Miralax bottle (288g).  ?Gatorade 64 oz (not red). ?Dulcolax tablets.  ? ?The Day Prior to Surgery: ?Take 4 ducolax tablets at 7am with water. ?Drink plenty of clear liquids all day to avoid dehydration, no solid food.  ?  ?Mix the bottle of Miralax and 64 oz of Gatorade and drink this mixture starting at 10am. Drink it gradually over the next few hours, 8 ounces every 15-30 minutes until it is gone. Finish this by 2pm.  ? ?All questions were answered to the satisfaction of the patient. ? ? ? ?[name] ?06/18/2021, 9:22 AM  ? ? ?

## 2021-06-23 NOTE — Patient Instructions (Addendum)
? ? Aquilla Solian ? 06/23/2021  ?  ? '@PREFPERIOPPHARMACY'$ @ ? ? Your procedure is scheduled on 06/26/2021. ? Report to [facility] at 9:25 A.M. ? Call this number if you have problems the morning of surgery: ? [phone] ? ? Remember: ? Do not eat or drink after midnight. ? ? Do Not take any diabetic medications the morning of your surgery. ? ? See Instructions for Chlorhexidine Wash  ? ?  Take these medicines the morning of surgery with A SIP OF WATER : Procardia ?  ? Do not wear jewelry, make-up or nail polish. ? Do not wear lotions, powders, or perfumes, or deodorant. ? Do not shave 48 hours prior to surgery.  Men may shave face and neck. ? Do not bring valuables to the hospital. ? [facility] is not responsible for any belongings or valuables. ? ?Contacts, dentures or bridgework may not be worn into surgery.  Leave your suitcase in the car.  After surgery it may be brought to your room. ? ?For patients admitted to the hospital, discharge time will be determined by your treatment team. ? ?Patients discharged the day of surgery will not be allowed to drive home.  ? ?Name and phone number of your driver:   Family ?Special instructions:  N/A ? ?Please read over the following fact sheets that you were given. ?Care and Recovery After Surgery ? ?Laparoscopic Appendectomy, Adult ? ?A laparoscopic appendectomy is a surgery to take out the appendix. The appendix is a finger-like structure that is attached to the large intestine. This procedure may be done to prevent an inflamed appendix from bursting (rupturing). It may also be done to treat the infection from an appendix that has ruptured. It is often done right after appendicitis is diagnosed. Appendicitis is inflammation of the appendix. ?In this surgery, your health care provider uses a thin, lighted tube with a camera (laparoscope) to take out the appendix through three small incisions. This is a minimally invasive surgery. It usually results in less pain, fewer  problems, and a quicker recovery than surgery done through a large incision (open appendectomy). ?Tell a health care provider about: ?Any allergies you have. ?All medicines you are taking, including vitamins, herbs, eye drops, creams, and over-the-counter medicines. ?Any steroid use. This includes creams or steroids you take by mouth. ?Any problems you or family members have had with anesthetic medicines. ?Any bleeding problems you have. ?Any surgeries you have had. ?Any medical conditions you have. ?Whether you are pregnant or may be pregnant. ?What are the risks? ?Generally, this is a safe procedure. However, problems may occur, including: ?Infection. ?Bleeding. ?Damage to nearby structures or organs. ?Allergic reactions to medicines. ?A collection of pus (abscess). ?Blood clots in the legs. ?What happens before the procedure? ?When to stop eating and drinking ?Follow instructions from your health care provider about eating and drinking restrictions. You may be asked not to eat or drink as soon as the diagnosis of appendicitis is made. ?Medicines ?Ask your health care provider about: ?Changing or stopping your regular medicines. This is especially important if you are taking diabetes medicines or blood thinners. ?Taking medicines such as aspirin and ibuprofen. These medicines can thin your blood. Do not take these medicines unless your health care provider tells you to take them. ?Taking over-the-counter medicines, vitamins, herbs, and supplements. ?General instructions ?If you will be going home right after the procedure, plan to have a responsible adult: ?Take you home from the hospital or clinic. You will  not be allowed to drive. ?Care for you for the time you are told. ?Ask your health care provider: ?How your surgery site will be marked. ?What steps will be taken to help prevent infection. These steps may include: ?Removing hair at the surgery site. ?Washing skin with a germ-killing soap. ?Taking antibiotic  medicine. ?What happens during the procedure? ? ?An IV will be inserted into one of your veins. ?You will be given one or more of the following: ?A medicine to help you relax (sedative). ?A medicine to numb the area (local anesthetic). ?A medicine to make you fall asleep (general anesthetic). ?A thin, flexible tube (catheter) may be put into your bladder to drain urine. ?A tube may be passed through your nose or mouth and into your stomach (orogastric or nasogastric tube) to drain any stomach contents. ?Your surgeon will make three small incisions near your belly button (navel). ?A gas (carbon dioxide) will be used to fill your abdomen. The gas will make your abdomen expand. This helps the surgeon see clearly and gives him or her more room to work. ?A laparoscope will be passed through one of the incisions. Other surgical instruments will be passed through the other incisions to assist in surgery. ?The appendix will be located and removed through one of the incisions. ?The abdomen may be washed out to remove bacteria. ?The incisions will be closed with stitches (sutures), staples, or adhesive strips. ?A bandage (dressing) may be used to cover the incisions. ?If a tube was inserted into your bladder or stomach, it will be removed. ?The procedure may vary among health care providers and hospitals. ?What happens after the procedure? ?Your blood pressure, heart rate, breathing rate, and blood oxygen level will be monitored until you leave the hospital or clinic. ?You will be given medicines as needed to control pain and infection. ?If you were given a sedative during the procedure, it can affect you for several hours. Do not drive or operate machinery until your health care provider says that it is safe. ?If your appendix did not rupture, you may be able to go home the same day after your surgery. ?If your appendix ruptured: ?You will get antibiotic medicine through an IV line. ?You may be sent home with a temporary  drain. ?Summary ?A laparoscopic appendectomy is a surgery to take out the appendix. The appendix is removed through three small incisions with the help of a thin, lighted tube that has a camera (laparoscope). ?This is a safe procedure, but there are some risks. Risks include bleeding, infection, allergic reaction to medicines, or damage to nearby organs. ?After the procedure, your blood pressure, heart rate, breathing rate, and blood oxygen level will be monitored until you leave the hospital or clinic. ?You will be given medicines as needed to control pain and infection. ?This information is not intended to replace advice given to you by your health care provider. Make sure you discuss any questions you have with your health care provider. ?Document Revised: 11/28/2020 Document Reviewed: 11/28/2020 ?Elsevier Patient Education ? [place]. ? ?General Anesthesia, Adult ?General anesthesia is the use of medicines to make a person "go to sleep" (unconscious) for a medical procedure. General anesthesia must be used for certain procedures, and is often recommended for procedures that: ?Last a long time. ?Require you to be still or in an unusual position. ?Are major and can cause blood loss. ?The medicines used for general anesthesia are called general anesthetics. As well as making you unconscious for  a certain amount of time, these medicines: ?Prevent pain. ?Control your blood pressure. ?Relax your muscles. ?Tell a health care provider about: ?Any allergies you have. ?All medicines you are taking, including vitamins, herbs, eye drops, creams, and over-the-counter medicines. ?Any problems you or family members have had with anesthetic medicines. ?Types of anesthetics you have had in the past. ?Any blood disorders you have. ?Any surgeries you have had. ?Any medical conditions you have. ?Any recent upper respiratory, chest, or ear infections. ?Any history of: ?Heart or lung conditions, such as heart failure, sleep  apnea, asthma, or chronic obstructive pulmonary disease (COPD). ?[employer]ed forces logistics/support/administrative officer. ?Depression or anxiety. ?Any tobacco or drug use, including marijuana or alcohol use. ?Whether you are pregnant or may be preg

## 2021-06-24 ENCOUNTER — Encounter (HOSPITAL_COMMUNITY)
Admission: RE | Admit: 2021-06-24 | Discharge: 2021-06-24 | Disposition: A | Discharge status: Home / Self Care | Payer: No Typology Code available for payment source | Source: Ambulatory Visit | Attending: General Surgery | Admitting: General Surgery

## 2021-06-24 ENCOUNTER — Encounter (HOSPITAL_COMMUNITY): Payer: Self-pay

## 2021-06-24 ENCOUNTER — Other Ambulatory Visit: Payer: Self-pay

## 2021-06-26 ENCOUNTER — Ambulatory Visit (HOSPITAL_COMMUNITY)
Admission: RE | Admit: 2021-06-26 | Discharge: 2021-06-26 | Disposition: A | Discharge status: Home / Self Care | Payer: No Typology Code available for payment source | Source: Ambulatory Visit | Attending: General Surgery | Admitting: General Surgery

## 2021-06-26 ENCOUNTER — Encounter (HOSPITAL_COMMUNITY): Payer: Self-pay | Admitting: General Surgery

## 2021-06-26 ENCOUNTER — Encounter (HOSPITAL_COMMUNITY)
Admission: RE | Disposition: A | Discharge status: Home / Self Care | Payer: Self-pay | Source: Ambulatory Visit | Attending: General Surgery

## 2021-06-26 ENCOUNTER — Ambulatory Visit (HOSPITAL_BASED_OUTPATIENT_CLINIC_OR_DEPARTMENT_OTHER): Payer: No Typology Code available for payment source | Admitting: Certified Registered"

## 2021-06-26 ENCOUNTER — Ambulatory Visit (HOSPITAL_COMMUNITY): Payer: No Typology Code available for payment source | Admitting: Certified Registered"

## 2021-06-26 ENCOUNTER — Other Ambulatory Visit: Payer: Self-pay

## 2021-06-26 DIAGNOSIS — E119 Type 2 diabetes mellitus without complications: Secondary | ICD-10-CM | POA: Insufficient documentation

## 2021-06-26 DIAGNOSIS — C181 Malignant neoplasm of appendix: Secondary | ICD-10-CM | POA: Diagnosis not present

## 2021-06-26 DIAGNOSIS — Z87891 Personal history of nicotine dependence: Secondary | ICD-10-CM | POA: Diagnosis not present

## 2021-06-26 DIAGNOSIS — Z79899 Other long term (current) drug therapy: Secondary | ICD-10-CM | POA: Insufficient documentation

## 2021-06-26 DIAGNOSIS — K3532 Acute appendicitis with perforation and localized peritonitis, without abscess: Secondary | ICD-10-CM

## 2021-06-26 DIAGNOSIS — I10 Essential (primary) hypertension: Secondary | ICD-10-CM | POA: Diagnosis not present

## 2021-06-26 DIAGNOSIS — Z7984 Long term (current) use of oral hypoglycemic drugs: Secondary | ICD-10-CM | POA: Diagnosis not present

## 2021-06-26 HISTORY — PX: LAPAROSCOPIC APPENDECTOMY: SHX408

## 2021-06-26 LAB — GLUCOSE, CAPILLARY: Glucose-Capillary: 138 mg/dL — ABNORMAL HIGH (ref 70–99)

## 2021-06-26 SURGERY — APPENDECTOMY, LAPAROSCOPIC
Anesthesia: General | Site: Abdomen

## 2021-06-26 MED ORDER — FENTANYL CITRATE (PF) 100 MCG/2ML IJ SOLN
INTRAMUSCULAR | Status: DC | PRN
  Administered 2021-06-26: 100 ug via INTRAVENOUS
  Administered 2021-06-26 (×3): 50 ug via INTRAVENOUS

## 2021-06-26 MED ORDER — DEXAMETHASONE SODIUM PHOSPHATE 10 MG/ML IJ SOLN
INTRAMUSCULAR | Status: AC
  Filled 2021-06-26: qty 1

## 2021-06-26 MED ORDER — CHLORHEXIDINE GLUCONATE CLOTH 2 % EX PADS: 6.0000 | MEDICATED_PAD | Freq: Once | CUTANEOUS | Status: DC

## 2021-06-26 MED ORDER — ONDANSETRON HCL 4 MG/2ML IJ SOLN: 4.0000 mg | Freq: Once | INTRAMUSCULAR | Status: DC | PRN

## 2021-06-26 MED ORDER — KETOROLAC TROMETHAMINE 30 MG/ML IJ SOLN
INTRAMUSCULAR | Status: AC
  Filled 2021-06-26: qty 1

## 2021-06-26 MED ORDER — PHENYLEPHRINE 80 MCG/ML (10ML) SYRINGE FOR IV PUSH (FOR BLOOD PRESSURE SUPPORT)
PREFILLED_SYRINGE | INTRAVENOUS | Status: AC
  Filled 2021-06-26: qty 20

## 2021-06-26 MED ORDER — ROCURONIUM 10MG/ML (10ML) SYRINGE FOR MEDFUSION PUMP - OPTIME
INTRAVENOUS | Status: DC | PRN
  Administered 2021-06-26: 40 mg via INTRAVENOUS

## 2021-06-26 MED ORDER — FENTANYL CITRATE (PF) 250 MCG/5ML IJ SOLN
INTRAMUSCULAR | Status: AC
  Filled 2021-06-26: qty 5

## 2021-06-26 MED ORDER — LIDOCAINE HCL (CARDIAC) PF 50 MG/5ML IV SOSY
PREFILLED_SYRINGE | INTRAVENOUS | Status: DC | PRN
  Administered 2021-06-26: 100 mg via INTRAVENOUS

## 2021-06-26 MED ORDER — LIDOCAINE HCL (PF) 2 % IJ SOLN
INTRAMUSCULAR | Status: AC
Start: 2021-06-26 — End: ?
  Filled 2021-06-26: qty 10

## 2021-06-26 MED ORDER — ONDANSETRON HCL 4 MG PO TABS: 4.0000 mg | ORAL_TABLET | Freq: Three times a day (TID) | ORAL | 1 refills | Status: AC | PRN

## 2021-06-26 MED ORDER — HYDROMORPHONE HCL 1 MG/ML IJ SOLN: 0.2500 mg | INTRAMUSCULAR | Status: DC | PRN

## 2021-06-26 MED ORDER — ROCURONIUM BROMIDE 10 MG/ML (PF) SYRINGE
PREFILLED_SYRINGE | INTRAVENOUS | Status: AC
  Filled 2021-06-26: qty 10

## 2021-06-26 MED ORDER — ONDANSETRON HCL 4 MG/2ML IJ SOLN
INTRAMUSCULAR | Status: AC
  Filled 2021-06-26: qty 2

## 2021-06-26 MED ORDER — PROPOFOL 10 MG/ML IV BOLUS
INTRAVENOUS | Status: AC
  Filled 2021-06-26: qty 20

## 2021-06-26 MED ORDER — ONDANSETRON HCL 4 MG/2ML IJ SOLN
INTRAMUSCULAR | Status: AC
  Filled 2021-06-26: qty 4

## 2021-06-26 MED ORDER — CHLORHEXIDINE GLUCONATE 0.12 % MT SOLN
15.0000 mL | Freq: Once | OROMUCOSAL | Status: AC
  Administered 2021-06-26: 15 mL via OROMUCOSAL
  Filled 2021-06-26: qty 15

## 2021-06-26 MED ORDER — LACTATED RINGERS IV SOLN: INTRAVENOUS | Status: DC

## 2021-06-26 MED ORDER — ORAL CARE MOUTH RINSE: 15.0000 mL | Freq: Once | OROMUCOSAL | Status: AC

## 2021-06-26 MED ORDER — BUPIVACAINE LIPOSOME 1.3 % IJ SUSP
INTRAMUSCULAR | Status: AC
  Filled 2021-06-26: qty 20

## 2021-06-26 MED ORDER — METOPROLOL TARTRATE 5 MG/5ML IV SOLN
INTRAVENOUS | Status: DC | PRN
  Administered 2021-06-26: 2.5 mg via INTRAVENOUS

## 2021-06-26 MED ORDER — ONDANSETRON HCL 4 MG/2ML IJ SOLN
INTRAMUSCULAR | Status: DC | PRN
  Administered 2021-06-26: 4 mg via INTRAVENOUS

## 2021-06-26 MED ORDER — BUPIVACAINE LIPOSOME 1.3 % IJ SUSP
INTRAMUSCULAR | Status: DC | PRN
  Administered 2021-06-26: 20 mL

## 2021-06-26 MED ORDER — LIDOCAINE HCL (PF) 2 % IJ SOLN
INTRAMUSCULAR | Status: AC
  Filled 2021-06-26: qty 5

## 2021-06-26 MED ORDER — EPHEDRINE 5 MG/ML INJ
INTRAVENOUS | Status: AC
  Filled 2021-06-26: qty 10

## 2021-06-26 MED ORDER — PROPOFOL 10 MG/ML IV BOLUS
INTRAVENOUS | Status: DC | PRN
  Administered 2021-06-26: 140 mg via INTRAVENOUS

## 2021-06-26 MED ORDER — SUGAMMADEX SODIUM 200 MG/2ML IV SOLN
INTRAVENOUS | Status: DC | PRN
  Administered 2021-06-26: 283.2 mg via INTRAVENOUS

## 2021-06-26 MED ORDER — SODIUM CHLORIDE 0.9 % IV SOLN
2.0000 g | INTRAVENOUS | Status: AC
  Administered 2021-06-26: 2 g via INTRAVENOUS
  Filled 2021-06-26: qty 2

## 2021-06-26 MED ORDER — OXYCODONE HCL 5 MG PO TABS: 5.0000 mg | ORAL_TABLET | ORAL | 0 refills | Status: DC | PRN

## 2021-06-26 MED ORDER — SODIUM CHLORIDE 0.9 % IR SOLN
Status: DC | PRN
  Administered 2021-06-26: 1000 mL

## 2021-06-26 MED ORDER — MIDAZOLAM HCL 2 MG/2ML IJ SOLN
INTRAMUSCULAR | Status: AC
  Filled 2021-06-26: qty 2

## 2021-06-26 MED ORDER — DEXAMETHASONE SODIUM PHOSPHATE 10 MG/ML IJ SOLN
INTRAMUSCULAR | Status: DC | PRN
  Administered 2021-06-26: 8 mg via INTRAVENOUS

## 2021-06-26 SURGICAL SUPPLY — 49 items
ADH SKN CLS APL DERMABOND .7 (GAUZE/BANDAGES/DRESSINGS) ×1
APL PRP STRL LF DISP 70% ISPRP (MISCELLANEOUS) ×1
BAG RETRIEVAL 10 (BASKET) ×1
BLADE SURG 15 STRL LF DISP TIS (BLADE) ×2 IMPLANT
BLADE SURG 15 STRL SS (BLADE) ×2
CHLORAPREP W/TINT 26 (MISCELLANEOUS) ×3 IMPLANT
CLOTH BEACON ORANGE TIMEOUT ST (SAFETY) ×3 IMPLANT
COVER LIGHT HANDLE STERIS (MISCELLANEOUS) ×6 IMPLANT
CUTTER FLEX LINEAR 45M (STAPLE) ×3 IMPLANT
DERMABOND ADVANCED (GAUZE/BANDAGES/DRESSINGS) ×1
DERMABOND ADVANCED .7 DNX12 (GAUZE/BANDAGES/DRESSINGS) ×2 IMPLANT
ELECT REM PT RETURN 9FT ADLT (ELECTROSURGICAL) ×2
ELECTRODE REM PT RTRN 9FT ADLT (ELECTROSURGICAL) ×2 IMPLANT
GLOVE BIO SURGEON STRL SZ 6.5 (GLOVE) ×3 IMPLANT
GLOVE BIOGEL PI IND STRL 6.5 (GLOVE) ×2 IMPLANT
GLOVE BIOGEL PI IND STRL 7.0 (GLOVE) ×4 IMPLANT
GLOVE BIOGEL PI INDICATOR 6.5 (GLOVE) ×1
GLOVE BIOGEL PI INDICATOR 7.0 (GLOVE) ×2
GOWN STRL REUS W/TWL LRG LVL3 (GOWN DISPOSABLE) ×6 IMPLANT
INST SET LAPROSCOPIC AP (KITS) ×3 IMPLANT
KIT TURNOVER KIT A (KITS) ×3 IMPLANT
MANIFOLD NEPTUNE II (INSTRUMENTS) ×3 IMPLANT
NDL HYPO 18GX1.5 BLUNT FILL (NEEDLE) ×2 IMPLANT
NDL HYPO 21X1.5 SAFETY (NEEDLE) ×2 IMPLANT
NDL INSUFFLATION 14GA 120MM (NEEDLE) ×2 IMPLANT
NEEDLE HYPO 18GX1.5 BLUNT FILL (NEEDLE) ×2 IMPLANT
NEEDLE HYPO 21X1.5 SAFETY (NEEDLE) ×2 IMPLANT
NEEDLE INSUFFLATION 14GA 120MM (NEEDLE) ×2 IMPLANT
NS IRRIG 1000ML POUR BTL (IV SOLUTION) ×3 IMPLANT
PACK LAP CHOLE LZT030E (CUSTOM PROCEDURE TRAY) ×3 IMPLANT
PAD ARMBOARD 7.5X6 YLW CONV (MISCELLANEOUS) ×3 IMPLANT
PENCIL HANDSWITCHING (ELECTRODE) ×1 IMPLANT
RELOAD STAPLE 45 3.5 BLU ETS (ENDOMECHANICALS) IMPLANT
RELOAD STAPLE TA45 3.5 REG BLU (ENDOMECHANICALS) ×2 IMPLANT
SET BASIN LINEN APH (SET/KITS/TRAYS/PACK) ×3 IMPLANT
SET TUBE SMOKE EVAC HIGH FLOW (TUBING) ×3 IMPLANT
SHEARS HARMONIC ACE PLUS 36CM (ENDOMECHANICALS) ×3 IMPLANT
SUT MNCRL AB 4-0 PS2 18 (SUTURE) ×6 IMPLANT
SUT VICRYL 0 UR6 27IN ABS (SUTURE) ×3 IMPLANT
SYR 20ML LL LF (SYRINGE) ×6 IMPLANT
SYS BAG RETRIEVAL 10MM (BASKET) ×1
SYSTEM BAG RETRIEVAL 10MM (BASKET) ×2 IMPLANT
TRAY FOLEY W/BAG SLVR 16FR (SET/KITS/TRAYS/PACK) ×2
TRAY FOLEY W/BAG SLVR 16FR ST (SET/KITS/TRAYS/PACK) ×2 IMPLANT
TROCAR ENDO BLADELESS 11MM (ENDOMECHANICALS) ×3 IMPLANT
TROCAR ENDO BLADELESS 12MM (ENDOMECHANICALS) ×3 IMPLANT
TROCAR XCEL NON-BLD 5MMX100MML (ENDOMECHANICALS) ×3 IMPLANT
TUBE CONNECTING 12X1/4 (SUCTIONS) ×3 IMPLANT
WARMER LAPAROSCOPE (MISCELLANEOUS) ×3 IMPLANT

## 2021-06-26 NOTE — Transfer of Care (Signed)
Immediate Anesthesia Transfer of Care Note ? ?Patient: Blake Rodriguez ? ?Procedure(s) Performed: APPENDECTOMY LAPAROSCOPIC  (Abdomen) ? ?Patient Location: PACU ? ?Anesthesia Type:General ? ?Level of Consciousness: awake and alert  ? ?Airway & Oxygen Therapy: Patient Spontanous Breathing ? ?Post-op Assessment: Report given to RN and Post -op Vital signs reviewed and stable ? ?Post vital signs: Reviewed and stable ? ?Last Vitals:  ?Vitals Value Taken Time  ?BP    ?Temp    ?Pulse    ?Resp    ?SpO2    ? ? ?Last Pain:  ?Vitals:  ? 06/26/21 1046  ?TempSrc: Oral  ?PainSc: 0-No pain  ?   ? ?Patients Stated Pain Goal: 6 (06/26/21 1046) ? ?Complications: No notable events documented. ?

## 2021-06-26 NOTE — Interval H&P Note (Signed)
History and Physical Interval Note: ? ?06/26/2021 ?12:06 PM ? ?Blake Rodriguez  has presented today for surgery, with the diagnosis of ACUTE PERFORATED APPENDIX.  The various methods of treatment have been discussed with the patient and family. After consideration of risks, benefits and other options for treatment, the patient has consented to  Procedure(s): ?APPENDECTOMY LAPAROSCOPIC POSSIBLE OPEN (N/A) as a surgical intervention.  The patient's history has been reviewed, patient examined, no change in status, stable for surgery.  I have reviewed the patient's chart and labs.  Questions were answered to the patient's satisfaction.   ? ? ?[name] ? ? ?

## 2021-06-26 NOTE — Anesthesia Procedure Notes (Signed)
Procedure Name: Intubation ?Date/Time: 06/26/2021 12:45 PM ?Performed by: Ollen Bowl, CRNA ?Pre-anesthesia Checklist: Patient identified, Patient being monitored, Timeout performed, Emergency Drugs available and Suction available ?Patient Re-evaluated:Patient Re-evaluated prior to induction ?Oxygen Delivery Method: Circle system utilized ?Preoxygenation: Pre-oxygenation with 100% oxygen ?Induction Type: IV induction ?Ventilation: Mask ventilation without difficulty ?Laryngoscope Size: Mac and 3 ?Grade View: Grade I ?Tube type: Oral ?Tube size: 7.5 mm ?Number of attempts: 1 ?Airway Equipment and Method: Stylet ?Placement Confirmation: ETT inserted through vocal cords under direct vision, positive ETCO2 and breath sounds checked- equal and bilateral ?Secured at: 21 cm ?Tube secured with: Tape ?Dental Injury: Teeth and Oropharynx as per pre-operative assessment  ? ? ? ? ?

## 2021-06-26 NOTE — Op Note (Signed)
[facility] ? ?Date of Surgery: 06/26/2021  ?Admit Date: 06/26/2021  ? ?Performing Service: General  ?Surgeon(s) and Role: ?   * Keyonta Madrid, Lanell Matar, MD - Primary  ? ?Pre-operative Diagnosis: H/o Acute Perforated Appendicitis ? ?Post-operative Diagnosis: Same ? ?Procedure Performed: Laparoscopic Appendectomy  ? ?Surgeon: Lanell Matar. Constance Haw, MD  ? ?Assistant: No qualified resident was available.  ? ?Anesthesia: General  ? ?Findings:  ?The appendix was found to be inflamed. There were not signs of necrosis. There was not perforation at this time but there was significant inflammatory rind from the prior perforation. There was no current abscess.  ? ?Estimated Blood Loss: Minimal  ? ?Specimens:  ?ID Type Source Tests Collected by Time Destination  ?1 : Appendix Tissue PATH Appendix SURGICAL PATHOLOGY [name], MD 06/26/2021 1025   ?  ? ?Complications: None; patient tolerated the procedure well.  ? ?Disposition: PACU - hemodynamically stable.  ? ?Condition: stable  ? ?Indications: The patient presented with a history of perforated appendicitis in February 2023 and had an abscess formation and an IR drain. This resolved and we discussed the need for interval appendectomy.   ? ?Procedure Details  ?Prior to the procedure, the risks, benefits, complications, treatment options, and expected outcomes were discussed with the patient and/or family, including but not limited to the risk of bleeding, infection, finding of a normal appendix, and the need for conversion to an open procedure. There was concurrence with the proposed plan and informed consent was obtained. The patient was taken to the operating room, identified as Aquilla Solian and the procedure verified as Laproscopic Appendectomy.  ? ? ?The patient was placed in the supine position and general anesthesia was induced, along with placement of orogastric tube, SCD's, and a Foley catheter. The abdomen was prepped and draped in a sterile  fashion. The abdomen was entered with Veress technique in the infraumbilical incision. Intraperitoneal placement was confirmed with saline drop, low entry pressures, and easy insufflation. A 11 mm optiview trocar was placed under direct visualization with a 0 degree scope. The 10 mm 0 degree scope was placed in the abdomen and no evidence of injury was identified. A 12 mm port was placed in the left lower quadrant of the abdomen after skin incision with trocar placement under direct vision. A careful evaluation of the entire abdomen was carried out. An additional 5 mm port was placed in the suprapubic area under direct vision.  The patient was placed in Trendelenburg and left lateral decubitus position. The small intestines were retracted in the cephalad and left lateral direction away from the pelvis and right lower quadrant. The patient was found to have an dilated thickened appendix. There was not evidence of perforation at this time but an inflammatory rind from the prior perforation. No abscess was present ? ?The appendix was carefully dissected. A window was made in the mesoappendix at the base of the appendix. The appendix was divided at its base using a standard endo-GIA stapler. Minimal appendiceal stump was left in place. The mesoappendix was taken with the harmonic energy device. The appendix was placed within an Endocatch specimen bag. There was no evidence of bleeding, leakage, or complication after division of the appendix. ? ?Any remaining blood or pus was suctioned out from the abdomen, hemostasis was confirmed. The endocatch bag was removed via the 12 mm port, then the abdomen desufflated. The appendix was passed off the field as a specimen.  ? ?The the 12 mm and 10  mm port sites were closed with a 0 Vicryl suture. The trocar site skin wounds were closed using subcuticular 4-0 Monocryl suture and dermabond. The patient was then awakened from general anesthesia, extubated, and taken to PACU for  recovery.  ? ?Instrument, sponge, and needle counts were correct at the conclusion of the case.  ? ?Curlene Labrum, MD ?[facility] ?[place], [place] [address] ?[phone]([id number]) ? ? ? ? ?

## 2021-06-26 NOTE — Anesthesia Postprocedure Evaluation (Signed)
Anesthesia Post Note ? ?Patient: KACEN MELLINGER ? ?Procedure(s) Performed: APPENDECTOMY LAPAROSCOPIC  (Abdomen) ? ?Patient location during evaluation: Phase II ?Anesthesia Type: General ?Level of consciousness: awake and alert and oriented ?Pain management: pain level controlled ?Vital Signs Assessment: post-procedure vital signs reviewed and stable ?Respiratory status: spontaneous breathing, nonlabored ventilation and respiratory function stable ?Cardiovascular status: blood pressure returned to baseline and stable ?Postop Assessment: no apparent nausea or vomiting ?Anesthetic complications: no ? ? ?No notable events documented. ? ? ?Last Vitals:  ?Vitals:  ? 06/26/21 1400 06/26/21 1415  ?BP: (!) 148/85 (!) 154/82  ?Pulse: 91 86  ?Resp: 15 16  ?Temp:  (!) 36.3 ?C  ?SpO2: 100% 95%  ?  ?Last Pain:  ?Vitals:  ? 06/26/21 1415  ?TempSrc: Axillary  ?PainSc:   ? ? ?  ?  ?  ?  ?  ?  ? ?Kaegan Hettich C Karyn Brull ? ? ? ? ?

## 2021-06-26 NOTE — Anesthesia Preprocedure Evaluation (Addendum)
Anesthesia Evaluation  ?Patient identified by MRN, date of birth, ID band ?Patient awake ? ? ? ?Reviewed: ?Allergy & Precautions, NPO status , Patient's Chart, lab work & pertinent test results ? ?Airway ?Mallampati: II ? ?TM Distance: >3 FB ?Neck ROM: Full ? ? ? Dental ? ?(+) Dental Advisory Given, Missing, Chipped ?  ?Pulmonary ?former smoker,  ?  ?Pulmonary exam normal ?breath sounds clear to auscultation ? ? ? ? ? ? Cardiovascular ?hypertension, Pt. on medications ?Normal cardiovascular exam ?Rhythm:Regular Rate:Normal ? ? ?  ?Neuro/Psych ?negative neurological ROS ? negative psych ROS  ? GI/Hepatic ?negative GI ROS, Neg liver ROS,   ?Endo/Other  ?diabetes, Well Controlled, Type 2, Oral Hypoglycemic Agents ? Renal/GU ?Renal InsufficiencyRenal disease (AKI)  ?negative genitourinary ?  ?Musculoskeletal ?negative musculoskeletal ROS ?(+)  ? Abdominal ?  ?Peds ?negative pediatric ROS ?(+)  Hematology ?negative hematology ROS ?(+)   ?Anesthesia Other Findings ? ? Reproductive/Obstetrics ?negative OB ROS ? ?  ? ? ? ? ? ? ? ? ? ? ? ? ? ?  ?  ? ? ? ? ? ? ? ?Anesthesia Physical ?Anesthesia Plan ? ?ASA: 2 ? ?Anesthesia Plan: General  ? ?Post-op Pain Management: Dilaudid IV  ? ?Induction: Intravenous ? ?PONV Risk Score and Plan: 4 or greater and Ondansetron and Dexamethasone ? ?Airway Management Planned: Oral ETT ? ?Additional Equipment:  ? ?Intra-op Plan:  ? ?Post-operative Plan: Extubation in OR ? ?Informed Consent: I have reviewed the patients History and Physical, chart, labs and discussed the procedure including the risks, benefits and alternatives for the proposed anesthesia with the patient or authorized representative who has indicated his/her understanding and acceptance.  ? ? ? ?Dental advisory given ? ?Plan Discussed with: CRNA and Surgeon ? ?Anesthesia Plan Comments:   ? ? ? ? ? ? ?Anesthesia Quick Evaluation ? ?

## 2021-06-26 NOTE — Progress Notes (Signed)
[facility] ? ?Discussed with family. Rx sent to pharmacy. Will do post op phone call in 2 weeks. ? ?Curlene Labrum, MD ?[facility] ?[place], [place] [address] ?[phone] (office) ? ?

## 2021-06-26 NOTE — Discharge Instructions (Signed)
Discharge Laparoscopic Surgery Instructions:  Common Complaints: Right shoulder pain is common after laparoscopic surgery. This is secondary to the gas used in the surgery being trapped under the diaphragm.  Walk to help your body absorb the gas. This will improve in a few days. Pain at the port sites are common, especially the larger port sites. This will improve with time.  Some nausea is common and poor appetite. The main goal is to stay hydrated the first few days after surgery.   Diet/ Activity: Diet as tolerated. You may not have an appetite, but it is important to stay hydrated. Drink 64 ounces of water a day. Your appetite will return with time.  Shower per your regular routine daily.  Do not take hot showers. Take warm showers that are less than 10 minutes. Rest and listen to your body, but do not remain in bed all day.  Walk everyday for at least 15-20 minutes. Deep cough and move around every 1-2 hours in the first few days after surgery.  Do not lift > 10 lbs, perform excessive bending, pushing, pulling, squatting for 1-2 weeks after surgery.  Do not pick at the dermabond glue on your incision sites.  This glue film will remain in place for 1-2 weeks and will start to peel off.  Do not place lotions or balms on your incision unless instructed to specifically by Dr. Melysa Schroyer.   Pain Expectations and Narcotics: -After surgery you will have pain associated with your incisions and this is normal. The pain is muscular and nerve pain, and will get better with time. -You are encouraged and expected to take non narcotic medications like tylenol and ibuprofen (when able) to treat pain as multiple modalities can aid with pain treatment. -Narcotics are only used when pain is severe or there is breakthrough pain. -You are not expected to have a pain score of 0 after surgery, as we cannot prevent pain. A pain score of 3-4 that allows you to be functional, move, walk, and tolerate some activity is  the goal. The pain will continue to improve over the days after surgery and is dependent on your surgery. -Due to [place] law, we are only able to give a certain amount of pain medication to treat post operative pain, and we only give additional narcotics on a patient by patient basis.  -For most laparoscopic surgery, studies have shown that the majority of patients only need 10-15 narcotic pills, and for open surgeries most patients only need 15-20.   -Having appropriate expectations of pain and knowledge of pain management with non narcotics is important as we do not want anyone to become addicted to narcotic pain medication.  -Using ice packs in the first 48 hours and heating pads after 48 hours, wearing an abdominal binder (when recommended), and using over the counter medications are all ways to help with pain management.   -Simple acts like meditation and mindfulness practices after surgery can also help with pain control and research has proven the benefit of these practices.  Medication: Take tylenol and ibuprofen as needed for pain control, alternating every 4-6 hours.  Example:  Tylenol 1000mg @ 6am, 12noon, 6pm, 12midnight (Do not exceed 4000mg of tylenol a day). Ibuprofen 800mg @ 9am, 3pm, 9pm, 3am (Do not exceed 3600mg of ibuprofen a day).  Take Roxicodone for breakthrough pain every 4 hours.  Take Colace for constipation related to narcotic pain medication. If you do not have a bowel movement in 2 days, take Miralax   over the counter.  Drink plenty of water to also prevent constipation.   Contact Information: If you have questions or concerns, please call our office, [phone], Monday- Thursday 8AM-5PM and Friday 8AM-12Noon.  If it is after hours or on the weekend, please call [facility] Main Number, [phone], [phone], and ask to speak to the surgeon on call for Dr. Willy Pinkerton at [place].   

## 2021-06-30 ENCOUNTER — Encounter (HOSPITAL_COMMUNITY): Payer: Self-pay | Admitting: General Surgery

## 2021-07-03 ENCOUNTER — Telehealth (INDEPENDENT_AMBULATORY_CARE_PROVIDER_SITE_OTHER): Payer: No Typology Code available for payment source | Admitting: General Surgery

## 2021-07-03 DIAGNOSIS — C181 Malignant neoplasm of appendix: Secondary | ICD-10-CM

## 2021-07-03 DIAGNOSIS — K3532 Acute appendicitis with perforation and localized peritonitis, without abscess: Secondary | ICD-10-CM

## 2021-07-03 LAB — SURGICAL PATHOLOGY

## 2021-07-03 NOTE — Telephone Encounter (Signed)
Received VM from patient daughter Gaspar Garbe with concerns. ? ?Discussed pathology with daughter and current plan of information gathering with Oncology. Verbalized understanding.  ?

## 2021-07-03 NOTE — Telephone Encounter (Signed)
[facility] ? ?Will see patient at 5/11 @ 9:45AM. ?Oncology referral will be placed. ? ? ?FINAL MICROSCOPIC DIAGNOSIS:  ? ?A. APPENDIX, APPENDECTOMY:  ?- Goblet cell adenocarcinoma, low grade.  ?- Acute appendicitis.  Perforation not identified.  ? ?COMMENT:  ? ?The case was discussed with Dr. Constance Haw at 10:50 a.m. on 07/03/2021.  ? ?ONCOLOGY TABLE:  ? ?APPENDIX, CARCINOMA: Resection  ? ?Procedure: Appendectomy  ?Tumor Size: 4.7 cm  ?Histologic Type: Goblet cell adenocarcinoma  ?Histologic Grade: G1, well differentiated  ?Tumor Deposits: Not identified  ?Tumor Extension: Tumor invades through muscularis propria into subserosa  ?or mesoappendix but does not extend to serosal surface  ?Lymphovascular Invasion: Not identified  ?Perineural invasion: Present in periappendiceal adipose tissue  ?Margins:  ?     Margin Status for Invasive Carcinoma:  Present, proximal margin  ?Regional Lymph Nodes: Not applicable (no lymph nodes submitted or found)  ?Distant Metastasis:  ?     Distant Site(s) Involved: Not applicable  ?Pathologic Stage Classification (pTNM, AJCC 8th Edition): pT3, pN not assigned (no nodes submitted or found)  ?Ancillary Studies: Can be performed upon request  ?Representative Tumor Block: A1  ?Comment(s): None  ? ? ?

## 2021-07-04 ENCOUNTER — Encounter (HOSPITAL_COMMUNITY): Payer: Self-pay | Admitting: Emergency Medicine

## 2021-07-04 ENCOUNTER — Emergency Department (HOSPITAL_COMMUNITY): Payer: No Typology Code available for payment source

## 2021-07-04 ENCOUNTER — Inpatient Hospital Stay (HOSPITAL_COMMUNITY)
Admission: EM | Admit: 2021-07-04 | Discharge: 2021-07-08 | DRG: 862 | Disposition: A | Discharge status: Home / Self Care | Payer: No Typology Code available for payment source | Attending: Internal Medicine | Admitting: Internal Medicine

## 2021-07-04 DIAGNOSIS — I10 Essential (primary) hypertension: Secondary | ICD-10-CM | POA: Diagnosis present

## 2021-07-04 DIAGNOSIS — T8144XA Sepsis following a procedure, initial encounter: Secondary | ICD-10-CM | POA: Diagnosis present

## 2021-07-04 DIAGNOSIS — Z79899 Other long term (current) drug therapy: Secondary | ICD-10-CM

## 2021-07-04 DIAGNOSIS — C181 Malignant neoplasm of appendix: Secondary | ICD-10-CM | POA: Diagnosis present

## 2021-07-04 DIAGNOSIS — Z7984 Long term (current) use of oral hypoglycemic drugs: Secondary | ICD-10-CM

## 2021-07-04 DIAGNOSIS — Z87891 Personal history of nicotine dependence: Secondary | ICD-10-CM

## 2021-07-04 DIAGNOSIS — T8143XA Infection following a procedure, organ and space surgical site, initial encounter: Secondary | ICD-10-CM | POA: Diagnosis not present

## 2021-07-04 DIAGNOSIS — R000 Tachycardia, unspecified: Secondary | ICD-10-CM | POA: Diagnosis not present

## 2021-07-04 DIAGNOSIS — K91870 Postprocedural hematoma of a digestive system organ or structure following a digestive system procedure: Secondary | ICD-10-CM | POA: Diagnosis present

## 2021-07-04 DIAGNOSIS — K651 Peritoneal abscess: Secondary | ICD-10-CM

## 2021-07-04 DIAGNOSIS — E8809 Other disorders of plasma-protein metabolism, not elsewhere classified: Secondary | ICD-10-CM | POA: Diagnosis present

## 2021-07-04 DIAGNOSIS — N1832 Chronic kidney disease, stage 3b: Secondary | ICD-10-CM

## 2021-07-04 DIAGNOSIS — E1122 Type 2 diabetes mellitus with diabetic chronic kidney disease: Secondary | ICD-10-CM | POA: Diagnosis present

## 2021-07-04 DIAGNOSIS — E782 Mixed hyperlipidemia: Secondary | ICD-10-CM

## 2021-07-04 DIAGNOSIS — Y838 Other surgical procedures as the cause of abnormal reaction of the patient, or of later complication, without mention of misadventure at the time of the procedure: Secondary | ICD-10-CM | POA: Diagnosis present

## 2021-07-04 DIAGNOSIS — I129 Hypertensive chronic kidney disease with stage 1 through stage 4 chronic kidney disease, or unspecified chronic kidney disease: Secondary | ICD-10-CM | POA: Diagnosis present

## 2021-07-04 DIAGNOSIS — Z7982 Long term (current) use of aspirin: Secondary | ICD-10-CM

## 2021-07-04 DIAGNOSIS — A419 Sepsis, unspecified organism: Secondary | ICD-10-CM

## 2021-07-04 DIAGNOSIS — E1165 Type 2 diabetes mellitus with hyperglycemia: Secondary | ICD-10-CM | POA: Diagnosis present

## 2021-07-04 DIAGNOSIS — R109 Unspecified abdominal pain: Secondary | ICD-10-CM | POA: Diagnosis not present

## 2021-07-04 LAB — COMPREHENSIVE METABOLIC PANEL
ALT: 9 U/L (ref 0–44)
AST: 14 U/L — ABNORMAL LOW (ref 15–41)
Albumin: 3.2 g/dL — ABNORMAL LOW (ref 3.5–5.0)
Alkaline Phosphatase: 58 U/L (ref 38–126)
Anion gap: 12 (ref 5–15)
BUN: 32 mg/dL — ABNORMAL HIGH (ref 8–23)
CO2: 21 mmol/L — ABNORMAL LOW (ref 22–32)
Calcium: 8.9 mg/dL (ref 8.9–10.3)
Chloride: 105 mmol/L (ref 98–111)
Creatinine, Ser: 1.87 mg/dL — ABNORMAL HIGH (ref 0.61–1.24)
GFR, Estimated: 38 mL/min — ABNORMAL LOW (ref >60)
Glucose, Bld: 224 mg/dL — ABNORMAL HIGH (ref 70–99)
Potassium: 3.6 mmol/L (ref 3.5–5.1)
Sodium: 138 mmol/L (ref 135–145)
Total Bilirubin: 2.2 mg/dL — ABNORMAL HIGH (ref 0.3–1.2)
Total Protein: 7.3 g/dL (ref 6.5–8.1)

## 2021-07-04 LAB — CBC WITH DIFFERENTIAL/PLATELET
Abs Immature Granulocytes: 0.09 10*3/uL — ABNORMAL HIGH (ref 0.00–0.07)
Basophils Absolute: 0 10*3/uL (ref 0.0–0.1)
Basophils Relative: 0 %
Eosinophils Absolute: 0 10*3/uL (ref 0.0–0.5)
Eosinophils Relative: 0 %
HCT: 28.7 % — ABNORMAL LOW (ref 39.0–52.0)
Hemoglobin: 9 g/dL — ABNORMAL LOW (ref 13.0–17.0)
Immature Granulocytes: 1 %
Lymphocytes Relative: 7 %
Lymphs Abs: 1 10*3/uL (ref 0.7–4.0)
MCH: 28.8 pg (ref 26.0–34.0)
MCHC: 31.4 g/dL (ref 30.0–36.0)
MCV: 91.7 fL (ref 80.0–100.0)
Monocytes Absolute: 1.6 10*3/uL — ABNORMAL HIGH (ref 0.1–1.0)
Monocytes Relative: 11 %
Neutro Abs: 12.2 10*3/uL — ABNORMAL HIGH (ref 1.7–7.7)
Neutrophils Relative %: 81 %
Platelets: 354 10*3/uL (ref 150–400)
RBC: 3.13 MIL/uL — ABNORMAL LOW (ref 4.22–5.81)
RDW: 16.1 % — ABNORMAL HIGH (ref 11.5–15.5)
WBC: 14.9 10*3/uL — ABNORMAL HIGH (ref 4.0–10.5)
nRBC: 0 % (ref 0.0–0.2)

## 2021-07-04 LAB — BLOOD GAS, VENOUS
Acid-base deficit: 2.3 mmol/L — ABNORMAL HIGH (ref 0.0–2.0)
Bicarbonate: 21.4 mmol/L (ref 20.0–28.0)
Drawn by: 1517
FIO2: 21 %
O2 Saturation: 93.4 %
Patient temperature: 36.8
pCO2, Ven: 33 mmHg — ABNORMAL LOW (ref 44–60)
pH, Ven: 7.42 (ref 7.25–7.43)
pO2, Ven: 61 mmHg — ABNORMAL HIGH (ref 32–45)

## 2021-07-04 LAB — PROTIME-INR
INR: 1.1 (ref 0.8–1.2)
Prothrombin Time: 14.5 seconds (ref 11.4–15.2)

## 2021-07-04 LAB — LACTIC ACID, PLASMA: Lactic Acid, Venous: 1 mmol/L (ref 0.5–1.9)

## 2021-07-04 MED ORDER — ONDANSETRON HCL 4 MG/2ML IJ SOLN
4.0000 mg | Freq: Once | INTRAMUSCULAR | Status: AC
  Administered 2021-07-04: 4 mg via INTRAVENOUS
  Filled 2021-07-04: qty 2

## 2021-07-04 MED ORDER — FENTANYL CITRATE PF 50 MCG/ML IJ SOSY
50.0000 ug | PREFILLED_SYRINGE | Freq: Once | INTRAMUSCULAR | Status: AC
  Administered 2021-07-04: 50 ug via INTRAVENOUS
  Filled 2021-07-04: qty 1

## 2021-07-04 MED ORDER — PIPERACILLIN-TAZOBACTAM 3.375 G IVPB 30 MIN
3.3750 g | Freq: Once | INTRAVENOUS | Status: AC
  Administered 2021-07-04: 3.375 g via INTRAVENOUS
  Filled 2021-07-04: qty 50

## 2021-07-04 MED ORDER — SODIUM CHLORIDE 0.9 % IV BOLUS
1000.0000 mL | Freq: Once | INTRAVENOUS | Status: AC
  Administered 2021-07-04: 1000 mL via INTRAVENOUS

## 2021-07-04 MED ORDER — IOHEXOL 300 MG/ML  SOLN
100.0000 mL | Freq: Once | INTRAMUSCULAR | Status: AC | PRN
  Administered 2021-07-04: 75 mL via INTRAVENOUS

## 2021-07-04 NOTE — ED Provider Notes (Signed)
?[facility] ?Provider Note ? ? ?CSN: [id number] ?Arrival date & time: 07/04/21  2013 ? ?  ? ?History ? ?Chief Complaint  ?Patient presents with  ? Post-op Problem  ? ? ?Blake Rodriguez is a 71 y.o. male. ? ?HPI ? ?This patient is a 71 year old male, he has a history of recent appendicitis which had perforated, he was treated with antibiotics and unfortunately developed pelvic abscesses, he then had interventional radiology placed drains, this was successful he went for an appendectomy about 10 days ago, he no longer has any drains.  He reports over the last day or 2 he has had some recurrent abdominal pain and distention and states that he feels similar to the way that he did when he had abscesses.  He was noted to have a fever that he reports around 100 degrees, he has not been having any vomiting but he does not have any appetite and has been nauseated.  He has not had any diarrhea or constipation and has been passing gas and stools.  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  He is scheduled for oncology follow-up ? ?Home Medications ?Prior to Admission medications   ?Medication Sig Start Date End Date Taking? Authorizing Provider  ?acetaminophen (TYLENOL) 325 MG tablet Take 2 tablets (650 mg total) by mouth every 6 (six) hours as needed for mild pain, fever or headache. 04/28/21   Roxan Hockey, MD  ?acetaminophen (TYLENOL) 500 MG tablet Take 1,000 mg by mouth every 6 (six) hours as needed for moderate pain.    [provider]  ?Alogliptin Benzoate 12.5 MG TABS Take 12.5 mg by mouth daily.    [provider]  ?aspirin EC 81 MG tablet Take 1 tablet (81 mg total) by mouth daily with breakfast. 04/28/21   Roxan Hockey, MD  ?atorvastatin (LIPITOR) 80 MG tablet Take 80 mg by mouth daily. 08/19/17   [provider]  ?Cholecalciferol (VITAMIN D3  PO) Take 2 capsules by mouth daily.    [provider]  ?Cyanocobalamin (B-12 PO) Take 5 tablets by mouth daily.    [provider]  ?empagliflozin (JARDIANCE) 25 MG TABS tablet Take 25 mg by mouth daily. 10/15/20   [provider]  ?ezetimibe (ZETIA) 10 MG tablet Take 10 mg by mouth daily.    [provider]  ?lisinopril (PRINIVIL,ZESTRIL) 40 MG tablet Take 40 mg by mouth daily. 08/03/17   [provider]  ?metFORMIN (GLUCOPHAGE-XR) 500 MG 24 hr tablet Take 500 mg by mouth 2 (two) times daily. 08/23/17   [provider]  ?NIFEdipine (PROCARDIA XL/NIFEDICAL-XL) 90 MG 24 hr tablet Take 90 mg by mouth daily.    [provider]  ?ondansetron (ZOFRAN) 4 MG tablet Take 1 tablet (4 mg total) by mouth every 8 (eight) hours as needed. 06/26/21 06/26/22  [name], MD  ?oxyCODONE (ROXICODONE) 5 MG immediate release tablet Take 1 tablet (5 mg total) by mouth every 4 (four) hours as needed for breakthrough pain or severe pain. 06/26/21 06/26/22  [name], MD  ?pioglitazone (ACTOS) 15 MG tablet Take 15 mg by mouth daily.    [provider]  ?sodium chloride flush (NS) 0.9 % SOLN Place 10 mLs into feeding tube 2 (two) times daily. Flush catheter with 5-10 mLs daily. 05/21/21   Docia Barrier, PA  ?tiZANidine (ZANAFLEX) 4  MG tablet Take 1 tablet (4 mg total) by mouth at bedtime. ?Patient taking differently: Take 4 mg by mouth at bedtime as needed for muscle spasms. 05/04/21   Jaynee Eagles, PA-C  ?   ? ?Allergies    ?Patient has no known allergies.   ? ?Review of Systems   ?Review of Systems  ?All other systems reviewed and are negative. ? ?Physical Exam ?Updated Vital Signs ?BP 129/78 (BP Location: Right Arm)   Pulse (!) 110   Temp 98.2 ?F (36.8 ?C) (Oral)   Resp 18   Ht 1.753 m ('5\' 9"'$ )   Wt 70.8 kg   SpO2 96%   BMI 23.04 kg/m?  ?Physical Exam ?Vitals and nursing note reviewed.  ?Constitutional:   ?   General: He is not in acute  distress. ?   Appearance: He is well-developed.  ?HENT:  ?   Head: Normocephalic and atraumatic.  ?   Mouth/Throat:  ?   Pharynx: No oropharyngeal exudate.  ?Eyes:  ?   General: No scleral icterus.    ?   Right eye: No discharge.     ?   Left eye: No discharge.  ?   Conjunctiva/sclera: Conjunctivae normal.  ?   Pupils: Pupils are equal, round, and reactive to light.  ?Neck:  ?   Thyroid: No thyromegaly.  ?   Vascular: No JVD.  ?Cardiovascular:  ?   Rate and Rhythm: Regular rhythm. Tachycardia present.  ?   Heart sounds: Normal heart sounds. No murmur heard. ?  No friction rub. No gallop.  ?Pulmonary:  ?   Effort: Pulmonary effort is normal. No respiratory distress.  ?   Breath sounds: Normal breath sounds. No wheezing or rales.  ?Abdominal:  ?   General: Bowel sounds are normal. There is no distension.  ?   Palpations: Abdomen is soft. There is no mass.  ?   Tenderness: There is abdominal tenderness.  ?Musculoskeletal:     ?   General: No tenderness. Normal range of motion.  ?   Cervical back: Normal range of motion and neck supple.  ?Lymphadenopathy:  ?   Cervical: No cervical adenopathy.  ?Skin: ?   General: Skin is warm and dry.  ?   Findings: No erythema or rash.  ?Neurological:  ?   Mental Status: He is alert.  ?   Coordination: Coordination normal.  ?Psychiatric:     ?   Behavior: Behavior normal.  ? ? ?ED Results / Procedures / Treatments   ?Labs ?(all labs ordered are listed, but only abnormal results are displayed) ?Labs Reviewed  ?CBC WITH DIFFERENTIAL/PLATELET - Abnormal; Notable for the following components:  ?    Result Value  ? WBC 14.9 (*)   ? RBC 3.13 (*)   ? Hemoglobin 9.0 (*)   ? HCT 28.7 (*)   ? RDW 16.1 (*)   ? Neutro Abs 12.2 (*)   ? Monocytes Absolute 1.6 (*)   ? Abs Immature Granulocytes 0.09 (*)   ? All other components within normal limits  ?COMPREHENSIVE METABOLIC PANEL - Abnormal; Notable for the following components:  ? CO2 21 (*)   ? Glucose, Bld 224 (*)   ? BUN 32 (*)   ? Creatinine,  Ser 1.87 (*)   ? Albumin 3.2 (*)   ? AST 14 (*)   ? Total Bilirubin 2.2 (*)   ? GFR, Estimated 38 (*)   ? All other components within normal limits  ?BLOOD GAS, VENOUS -  Abnormal; Notable for the following components:  ? pCO2, Ven 33 (*)   ? pO2, Ven 61 (*)   ? Acid-base deficit 2.3 (*)   ? All other components within normal limits  ?LACTIC ACID, PLASMA  ?PROTIME-INR  ?LACTIC ACID, PLASMA  ? ? ?EKG ?None ? ?Radiology ?No results found. ? ?Procedures ?Marland KitchenCritical Care ?Performed by: Noemi Chapel, MD ?Authorized by: Noemi Chapel, MD  ? ?Critical care provider statement:  ?  Critical care time (minutes):  35 ?  Critical care time was exclusive of:  Teaching time and separately billable procedures and treating other patients ?  Critical care was necessary to treat or prevent imminent or life-threatening deterioration of the following conditions:  Sepsis ?  Critical care was time spent personally by me on the following activities:  Development of treatment plan with patient or surrogate, discussions with consultants, evaluation of patient's response to treatment, examination of patient, ordering and review of laboratory studies, ordering and review of radiographic studies, ordering and performing treatments and interventions, pulse oximetry, re-evaluation of patient's condition, review of old charts and obtaining history from patient or surrogate ?  I assumed direction of critical care for this patient from another provider in my specialty: no   ?Comments:  ?   At change of shift - care signed out to Dr. Sedonia Small to f/u results and disposition accordingly. ? ?  ? ? ?Medications Ordered in ED ?Medications  ?sodium chloride 0.9 % bolus 1,000 mL (1,000 mLs Intravenous New Bag/Given 07/04/21 2134)  ?fentaNYL (SUBLIMAZE) injection 50 mcg (50 mcg Intravenous Given 07/04/21 2133)  ?ondansetron [facility]) injection 4 mg (4 mg Intravenous Given 07/04/21 2131)  ?iohexol (OMNIPAQUE) 300 MG/ML solution 100 mL (75 mLs Intravenous Contrast Given  07/04/21 2309)  ? ? ?ED Course/ Medical Decision Making/ A&P ?  ?                        ?Medical Decision Making ?Amount and/or Complexity of Data Reviewed ?Labs: ordered. ?Radiology: ordered. ? ?Risk ?Prescription dr

## 2021-07-04 NOTE — ED Provider Notes (Signed)
?  Provider Note ?MRN:  [id number]  ?Arrival date & time: 07/05/21    ?ED Course and Medical Decision Making  ?Assumed care from Dr. Sabra Heck at shift change. ? ?Suspicion for intra-abdominal abscess.  Recent perforated appendicitis and abscesses.  Awaiting CT.  Will discuss results with Dr. Arnoldo Morale. ? ?CT reveals abscess, possibly related to dehiscence.  Case discussed, Dr. Arnoldo Morale, will admit to medicine. ? ?.Critical Care ?Performed by: Maudie Flakes, MD ?Authorized by: Maudie Flakes, MD  ? ?Critical care provider statement:  ?  Critical care time (minutes):  33 ?  Critical care was necessary to treat or prevent imminent or life-threatening deterioration of the following conditions: Intra-abdominal abscess. ?  Critical care was time spent personally by me on the following activities:  Development of treatment plan with patient or surrogate, discussions with consultants, evaluation of patient's response to treatment, examination of patient, ordering and review of laboratory studies, ordering and review of radiographic studies, ordering and performing treatments and interventions, pulse oximetry, re-evaluation of patient's condition and review of old charts ? ?Final Clinical Impressions(s) / ED Diagnoses  ? ?  ICD-10-CM   ?1. Postprocedural intraabdominal abscess  T81.43XA   ?  ?  ?ED Discharge Orders   ? ? None  ? ?  ?  ?Discharge Instructions   ?None ?  ? ? ?Barth Kirks. Sedonia Small, MD ?[facility] ?[place] [email or web address] ? ?  ?Maudie Flakes, MD ?07/05/21 0016 ? ?

## 2021-07-04 NOTE — ED Triage Notes (Addendum)
Pt c/o continued pain with walking and loss of appetite since having appendix removed by Dr. Constance Haw about 2 wks ago.  Pt also notified earlier this week that they found cancer on his appendix once it was removed.  ?

## 2021-07-05 ENCOUNTER — Other Ambulatory Visit: Payer: Self-pay

## 2021-07-05 DIAGNOSIS — E1165 Type 2 diabetes mellitus with hyperglycemia: Secondary | ICD-10-CM | POA: Diagnosis present

## 2021-07-05 DIAGNOSIS — E8809 Other disorders of plasma-protein metabolism, not elsewhere classified: Secondary | ICD-10-CM | POA: Diagnosis present

## 2021-07-05 DIAGNOSIS — N1832 Chronic kidney disease, stage 3b: Secondary | ICD-10-CM | POA: Diagnosis present

## 2021-07-05 DIAGNOSIS — A419 Sepsis, unspecified organism: Secondary | ICD-10-CM | POA: Diagnosis not present

## 2021-07-05 DIAGNOSIS — I129 Hypertensive chronic kidney disease with stage 1 through stage 4 chronic kidney disease, or unspecified chronic kidney disease: Secondary | ICD-10-CM | POA: Diagnosis present

## 2021-07-05 DIAGNOSIS — Z7984 Long term (current) use of oral hypoglycemic drugs: Secondary | ICD-10-CM | POA: Diagnosis not present

## 2021-07-05 DIAGNOSIS — K651 Peritoneal abscess: Secondary | ICD-10-CM

## 2021-07-05 DIAGNOSIS — R1031 Right lower quadrant pain: Secondary | ICD-10-CM

## 2021-07-05 DIAGNOSIS — D72829 Elevated white blood cell count, unspecified: Secondary | ICD-10-CM

## 2021-07-05 DIAGNOSIS — E782 Mixed hyperlipidemia: Secondary | ICD-10-CM

## 2021-07-05 DIAGNOSIS — R000 Tachycardia, unspecified: Secondary | ICD-10-CM | POA: Diagnosis not present

## 2021-07-05 DIAGNOSIS — Z87891 Personal history of nicotine dependence: Secondary | ICD-10-CM | POA: Diagnosis not present

## 2021-07-05 DIAGNOSIS — E1122 Type 2 diabetes mellitus with diabetic chronic kidney disease: Secondary | ICD-10-CM | POA: Diagnosis present

## 2021-07-05 DIAGNOSIS — T8144XA Sepsis following a procedure, initial encounter: Secondary | ICD-10-CM | POA: Diagnosis present

## 2021-07-05 DIAGNOSIS — Y838 Other surgical procedures as the cause of abnormal reaction of the patient, or of later complication, without mention of misadventure at the time of the procedure: Secondary | ICD-10-CM | POA: Diagnosis present

## 2021-07-05 DIAGNOSIS — I10 Essential (primary) hypertension: Secondary | ICD-10-CM | POA: Diagnosis not present

## 2021-07-05 DIAGNOSIS — R109 Unspecified abdominal pain: Secondary | ICD-10-CM | POA: Diagnosis present

## 2021-07-05 DIAGNOSIS — C181 Malignant neoplasm of appendix: Secondary | ICD-10-CM | POA: Diagnosis present

## 2021-07-05 DIAGNOSIS — T8143XA Infection following a procedure, organ and space surgical site, initial encounter: Secondary | ICD-10-CM | POA: Diagnosis present

## 2021-07-05 DIAGNOSIS — K91870 Postprocedural hematoma of a digestive system organ or structure following a digestive system procedure: Secondary | ICD-10-CM | POA: Diagnosis present

## 2021-07-05 DIAGNOSIS — Z79899 Other long term (current) drug therapy: Secondary | ICD-10-CM | POA: Diagnosis not present

## 2021-07-05 DIAGNOSIS — Z7982 Long term (current) use of aspirin: Secondary | ICD-10-CM | POA: Diagnosis not present

## 2021-07-05 LAB — LACTIC ACID, PLASMA: Lactic Acid, Venous: 1 mmol/L (ref 0.5–1.9)

## 2021-07-05 LAB — COMPREHENSIVE METABOLIC PANEL
ALT: 9 U/L (ref 0–44)
AST: 12 U/L — ABNORMAL LOW (ref 15–41)
Albumin: 3 g/dL — ABNORMAL LOW (ref 3.5–5.0)
Alkaline Phosphatase: 53 U/L (ref 38–126)
Anion gap: 11 (ref 5–15)
BUN: 31 mg/dL — ABNORMAL HIGH (ref 8–23)
CO2: 20 mmol/L — ABNORMAL LOW (ref 22–32)
Calcium: 8.4 mg/dL — ABNORMAL LOW (ref 8.9–10.3)
Chloride: 107 mmol/L (ref 98–111)
Creatinine, Ser: 1.63 mg/dL — ABNORMAL HIGH (ref 0.61–1.24)
GFR, Estimated: 45 mL/min — ABNORMAL LOW (ref >60)
Glucose, Bld: 177 mg/dL — ABNORMAL HIGH (ref 70–99)
Potassium: 3.8 mmol/L (ref 3.5–5.1)
Sodium: 138 mmol/L (ref 135–145)
Total Bilirubin: 1.8 mg/dL — ABNORMAL HIGH (ref 0.3–1.2)
Total Protein: 6.8 g/dL (ref 6.5–8.1)

## 2021-07-05 LAB — CBC
HCT: 30.2 % — ABNORMAL LOW (ref 39.0–52.0)
Hemoglobin: 9.6 g/dL — ABNORMAL LOW (ref 13.0–17.0)
MCH: 29.4 pg (ref 26.0–34.0)
MCHC: 31.8 g/dL (ref 30.0–36.0)
MCV: 92.4 fL (ref 80.0–100.0)
Platelets: 339 10*3/uL (ref 150–400)
RBC: 3.27 MIL/uL — ABNORMAL LOW (ref 4.22–5.81)
RDW: 16.2 % — ABNORMAL HIGH (ref 11.5–15.5)
WBC: 14.4 10*3/uL — ABNORMAL HIGH (ref 4.0–10.5)
nRBC: 0 % (ref 0.0–0.2)

## 2021-07-05 LAB — GLUCOSE, CAPILLARY
Glucose-Capillary: 123 mg/dL — ABNORMAL HIGH (ref 70–99)
Glucose-Capillary: 130 mg/dL — ABNORMAL HIGH (ref 70–99)
Glucose-Capillary: 143 mg/dL — ABNORMAL HIGH (ref 70–99)
Glucose-Capillary: 147 mg/dL — ABNORMAL HIGH (ref 70–99)
Glucose-Capillary: 158 mg/dL — ABNORMAL HIGH (ref 70–99)
Glucose-Capillary: 175 mg/dL — ABNORMAL HIGH (ref 70–99)

## 2021-07-05 LAB — PHOSPHORUS: Phosphorus: 4.2 mg/dL (ref 2.5–4.6)

## 2021-07-05 LAB — MAGNESIUM: Magnesium: 2.2 mg/dL (ref 1.7–2.4)

## 2021-07-05 MED ORDER — FENTANYL CITRATE PF 50 MCG/ML IJ SOSY: 25.0000 ug | PREFILLED_SYRINGE | INTRAMUSCULAR | Status: DC | PRN

## 2021-07-05 MED ORDER — SODIUM CHLORIDE 0.9 % IV SOLN: INTRAVENOUS | Status: DC

## 2021-07-05 MED ORDER — ONDANSETRON HCL 4 MG/2ML IJ SOLN
4.0000 mg | Freq: Four times a day (QID) | INTRAMUSCULAR | Status: DC | PRN
  Administered 2021-07-06: 4 mg via INTRAVENOUS
  Filled 2021-07-05: qty 2

## 2021-07-05 MED ORDER — PIPERACILLIN-TAZOBACTAM 3.375 G IVPB
3.3750 g | Freq: Three times a day (TID) | INTRAVENOUS | Status: DC
  Administered 2021-07-05 – 2021-07-08 (×10): 3.375 g via INTRAVENOUS
  Filled 2021-07-05 (×10): qty 50

## 2021-07-05 MED ORDER — INSULIN ASPART 100 UNIT/ML IJ SOLN
0.0000 [IU] | INTRAMUSCULAR | Status: DC
  Administered 2021-07-05: 1 [IU] via SUBCUTANEOUS
  Administered 2021-07-05: 2 [IU] via SUBCUTANEOUS
  Administered 2021-07-05 – 2021-07-06 (×3): 1 [IU] via SUBCUTANEOUS
  Administered 2021-07-06 – 2021-07-07 (×8): 2 [IU] via SUBCUTANEOUS

## 2021-07-05 MED ORDER — METOPROLOL TARTRATE 25 MG PO TABS
12.5000 mg | ORAL_TABLET | Freq: Two times a day (BID) | ORAL | Status: DC
  Administered 2021-07-05 – 2021-07-06 (×2): 12.5 mg via ORAL
  Filled 2021-07-05 (×3): qty 1

## 2021-07-05 MED ORDER — MORPHINE SULFATE (PF) 2 MG/ML IV SOLN
2.0000 mg | INTRAVENOUS | Status: DC | PRN
  Administered 2021-07-05 – 2021-07-07 (×4): 2 mg via INTRAVENOUS
  Filled 2021-07-05 (×4): qty 1

## 2021-07-05 MED ORDER — ACETAMINOPHEN 325 MG PO TABS
650.0000 mg | ORAL_TABLET | Freq: Four times a day (QID) | ORAL | Status: DC | PRN
Start: 2021-07-05 — End: 2021-07-08
  Administered 2021-07-05 – 2021-07-06 (×2): 650 mg via ORAL
  Filled 2021-07-05 (×2): qty 2

## 2021-07-05 MED ORDER — SODIUM CHLORIDE 0.9 % IV BOLUS
1000.0000 mL | Freq: Once | INTRAVENOUS | Status: AC
  Administered 2021-07-05: 1000 mL via INTRAVENOUS

## 2021-07-05 MED ORDER — SODIUM CHLORIDE 0.9 % IV SOLN
INTRAVENOUS | Status: DC
Start: 2021-07-05 — End: 2021-07-08

## 2021-07-05 NOTE — Assessment & Plan Note (Addendum)
Holding nifedipine, lisinopril initially due to soft BPs ?-IV metoprolol for now>>po metoprolol ?-will not restart lisinopril--BP remained controlled ?

## 2021-07-05 NOTE — Progress Notes (Signed)
Responded to nursing call:  tachycardia 140 while amb to bathroom ?I went to eval patient ? ?Subjective: ?Patient denies fevers, chills, headache, chest pain, dyspnea, nausea, ? ? ?Vitals:  ? 07/05/21 0327 07/05/21 0523 07/05/21 0749 07/05/21 1130  ?BP: (!) 147/62  (!) 152/66 (!) 150/65  ?Pulse: (!) 120  (!) 105 100  ?Resp: '20 19 19 19  '$ ?Temp: 98.2 ?F (36.8 ?C)  98.2 ?F (36.8 ?C) 98.6 ?F (37 ?C)  ?TempSrc: Oral  Oral Oral  ?SpO2: 100%  98% 98%  ?Weight:      ?Height:      ? ?CV--RRR ?Lung--diminished BS at bases, no wheeze ?Abd--soft+BS/NT ? ? ?Assessment/Plan: ?Tachycardia ?-personally reviewed EKG--sinus, ST depression V3-V6 (unchanged from prior) ?-bolus NS and start IVF ?-low dose metoprolol ? ? ? ? ?Orson Eva, DO ?[facility] ? ?

## 2021-07-05 NOTE — Consult Note (Signed)
Reason for Consult:intraabdominal abscess, s/p laparoscopic appendectomy ?Referring Physician: Dr. Carles Collet ? ?Blake Rodriguez is an 71 y.o. male.  ?HPI: Patient is a 71 year old white male status post a laparoscopic appendectomy on 06/26/2021 for perforated appendicitis that had been drained in the past who presents with generalized malaise and fevers.  I was called by the patient's daughter who was concerned that he was not progressing well after the surgery.  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  There is the intention that the patient will undergo a right hemicolectomy soon.  CT scan of the abdomen done yesterday evening reveals a large intra-abdominal abscess.  He also had a leukocytosis.  He was admitted for IV fluids and IV antibiotics. ? ?Past Medical History:  ?Diagnosis Date  ? Diabetes mellitus without complication ([place])   ? Hypertension   ? Lung collapse   ? ? ?Past Surgical History:  ?Procedure Laterality Date  ? IR RADIOLOGIST EVAL & MGMT  05/28/2021  ? IR RADIOLOGIST EVAL & MGMT  06/11/2021  ? LAPAROSCOPIC APPENDECTOMY N/A 06/26/2021  ? Procedure: APPENDECTOMY LAPAROSCOPIC ;  Surgeon: [name], MD;  Location: [facility];  Service: General;  Laterality: N/A;  ? LUNG BIOPSY  06/10/1983  ? SHOULDER ARTHROCENTESIS Left   ? ? ?History reviewed. No pertinent family history. ? ?Social History:  reports that he quit smoking about 28 years ago. His smoking use included cigarettes. He has a 15.00 pack-year smoking history. He has never used smokeless tobacco. He reports current alcohol use. He reports that he does not use drugs. ? ?Allergies: No Known Allergies ? ?Medications: I have reviewed the patient's current medications. ? ?Results for orders placed or performed during the hospital encounter of 07/04/21 (from the past 48 hour(s))  ?CBC with Differential     Status: Abnormal  ? Collection Time: 07/04/21   8:57 PM  ?Result Value Ref Range  ? WBC 14.9 (H) 4.0 - 10.5 K/uL  ? RBC 3.13 (L) 4.22 - 5.81 MIL/uL  ? Hemoglobin 9.0 (L) 13.0 - 17.0 g/dL  ? HCT 28.7 (L) 39.0 - 52.0 %  ? MCV 91.7 80.0 - 100.0 fL  ? MCH 28.8 26.0 - 34.0 pg  ? MCHC 31.4 30.0 - 36.0 g/dL  ? RDW 16.1 (H) 11.5 - 15.5 %  ? Platelets 354 150 - 400 K/uL  ? nRBC 0.0 0.0 - 0.2 %  ? Neutrophils Relative % 81 %  ? Neutro Abs 12.2 (H) 1.7 - 7.7 K/uL  ? Lymphocytes Relative 7 %  ? Lymphs Abs 1.0 0.7 - 4.0 K/uL  ? Monocytes Relative 11 %  ? Monocytes Absolute 1.6 (H) 0.1 - 1.0 K/uL  ? Eosinophils Relative 0 %  ? Eosinophils Absolute 0.0 0.0 - 0.5 K/uL  ? Basophils Relative 0 %  ? Basophils Absolute 0.0 0.0 - 0.1 K/uL  ? Immature Granulocytes 1 %  ? Abs Immature Granulocytes 0.09 (H) 0.00 - 0.07 K/uL  ?  Comment: Performed at [facility], [address] [place], [place] [address]  ?Comprehensive metabolic panel     Status: Abnormal  ? Collection Time: 07/04/21  8:57 PM  ?Result Value Ref Range  ? Sodium 138 135 - 145 mmol/L  ? Potassium 3.6 3.5 - 5.1 mmol/L  ? Chloride 105 98 - 111 mmol/L  ? CO2 21 (L) 22 - 32 mmol/L  ? Glucose, Bld  224 (H) 70 - 99 mg/dL  ?  Comment: Glucose reference range applies only to samples taken after fasting for at least 8 hours.  ? BUN 32 (H) 8 - 23 mg/dL  ? Creatinine, Ser 1.87 (H) 0.61 - 1.24 mg/dL  ? Calcium 8.9 8.9 - 10.3 mg/dL  ? Total Protein 7.3 6.5 - 8.1 g/dL  ? Albumin 3.2 (L) 3.5 - 5.0 g/dL  ? AST 14 (L) 15 - 41 U/L  ? ALT 9 0 - 44 U/L  ? Alkaline Phosphatase 58 38 - 126 U/L  ? Total Bilirubin 2.2 (H) 0.3 - 1.2 mg/dL  ? GFR, Estimated 38 (L) >60 mL/min  ?  Comment: (NOTE) ?Calculated using the CKD-EPI Creatinine Equation (2021) ?  ? Anion gap 12 5 - 15  ?  Comment: Performed at [facility], [address], [place], [place] [address]  ?Lactic acid, plasma     Status: None  ? Collection Time: 07/04/21  8:57 PM  ?Result Value Ref Range  ? Lactic Acid, Venous 1.0 0.5 - 1.9 mmol/L  ?  Comment: Performed at [facility]  [address] [place], [place] [address]  ?Blood gas, venous     Status: Abnormal  ? Collection Time: 07/04/21  8:57 PM  ?Result Value Ref Range  ? FIO2 21.00 %  ? pH, Ven 7.42 7.25 - 7.43  ? pCO2, Ven 33 (L) 44 - 60 mmHg  ? pO2, Ven 61 (H) 32 - 45 mmHg  ? Bicarbonate 21.4 20.0 - 28.0 mmol/L  ? Acid-base deficit 2.3 (H) 0.0 - 2.0 mmol/L  ? O2 Saturation 93.4 %  ? Patient temperature 36.8   ? Collection site BLOOD RIGHT ARM   ? Drawn by [id number]   ?  Comment: Performed at [facility], [address] [place], [place] [address]  ?Protime-INR     Status: None  ? Collection Time: 07/04/21  8:57 PM  ?Result Value Ref Range  ? Prothrombin Time 14.5 11.4 - 15.2 seconds  ? INR 1.1 0.8 - 1.2  ?  Comment: (NOTE) ?INR goal varies based on device and disease states. ?Performed at [facility], [address] [place], [place] [address] ?  ?Lactic acid, plasma     Status: None  ? Collection Time: 07/05/21  1:00 AM  ?Result Value Ref Range  ? Lactic Acid, Venous 1.0 0.5 - 1.9 mmol/L  ?  Comment: Performed at [facility], [address] [place], [place] [address]  ?Comprehensive metabolic panel     Status: Abnormal  ? Collection Time: 07/05/21  1:00 AM  ?Result Value Ref Range  ? Sodium 138 135 - 145 mmol/L  ? Potassium 3.8 3.5 - 5.1 mmol/L  ? Chloride 107 98 - 111 mmol/L  ? CO2 20 (L) 22 - 32 mmol/L  ? Glucose, Bld 177 (H) 70 - 99 mg/dL  ?  Comment: Glucose reference range applies only to samples taken after fasting for at least 8 hours.  ? BUN 31 (H) 8 - 23 mg/dL  ? Creatinine, Ser 1.63 (H) 0.61 - 1.24 mg/dL  ? Calcium 8.4 (L) 8.9 - 10.3 mg/dL  ? Total Protein 6.8 6.5 - 8.1 g/dL  ? Albumin 3.0 (L) 3.5 - 5.0 g/dL  ? AST 12 (L) 15 - 41 U/L  ? ALT 9 0 - 44 U/L  ? Alkaline Phosphatase 53 38 - 126 U/L  ? Total Bilirubin 1.8 (H) 0.3 - 1.2 mg/dL  ? GFR, Estimated 45 (L) >60 mL/min  ?  Comment: (NOTE) ?Calculated using the CKD-EPI Creatinine Equation (2021) ?  ? Anion gap 11 5 - 15  ?  Comment: Performed at [facility], [address]  [place], [place] [address]  ?CBC     Status: Abnormal  ? Collection Time: 07/05/21  1:00 AM  ?Result Value Ref Range  ? WBC 14.4 (H) 4.0 - 10.5 K/uL  ? RBC 3.27 (L) 4.22 - 5.81 MIL/uL  ? Hemoglobin 9.6 (L) 13.0 - 17.0 g/dL  ? HCT 30.2 (L) 39.0 - 52.0 %  ? MCV 92.4 80.0 - 100.0 fL  ? MCH 29.4 26.0 - 34.0 pg  ? MCHC 31.8 30.0 - 36.0 g/dL  ? RDW 16.2 (H) 11.5 - 15.5 %  ? Platelets 339 150 - 400 K/uL  ? nRBC 0.0 0.0 - 0.2 %  ?  Comment: Performed at [facility], [address], [place], [place] [address]  ?Magnesium     Status: None  ? Collection Time: 07/05/21  1:00 AM  ?Result Value Ref Range  ? Magnesium 2.2 1.7 - 2.4 mg/dL  ?  Comment: Performed at [facility], [address] [place], [place] [address]  ?Phosphorus     Status: None  ? Collection Time: 07/05/21  1:00 AM  ?Result Value Ref Range  ? Phosphorus 4.2 2.5 - 4.6 mg/dL  ?  Comment: Performed at [facility], [address], [place], [place] [address]  ?Glucose, capillary     Status: Abnormal  ? Collection Time: 07/05/21  3:36 AM  ?Result Value Ref Range  ? Glucose-Capillary 175 (H) 70 - 99 mg/dL  ?  Comment: Glucose reference range applies only to samples taken after fasting for at least 8 hours.  ?Glucose, capillary     Status: Abnormal  ? Collection Time: 07/05/21  7:17 AM  ?Result Value Ref Range  ? Glucose-Capillary 158 (H) 70 - 99 mg/dL  ?  Comment: Glucose reference range applies only to samples taken after fasting for at least 8 hours.  ? ? ?CT ABDOMEN PELVIS W CONTRAST ? ?Result Date: 07/04/2021 ?CLINICAL DATA:  Right lower quadrant abdominal pain. History of recent perforated appendicitis. EXAM: CT ABDOMEN AND PELVIS WITH CONTRAST TECHNIQUE: Multidetector CT imaging of the abdomen and pelvis was performed using the standard protocol following bolus administration of intravenous contrast. RADIATION DOSE REDUCTION: This exam was performed according to the departmental dose-optimization program which includes automated exposure control, adjustment of the mA  and/or kV according to patient size and/or use of iterative reconstruction technique. CONTRAST:  87m OMNIPAQUE IOHEXOL 300 MG/ML  SOLN COMPARISON:  Multiple CT of the abdomen pelvis dating back to 02/25/202

## 2021-07-05 NOTE — Hospital Course (Addendum)
71 year old male with a history of hypertension, diabetes mellitus type 2, hyperlipidemia presenting with 3 to 4-day history of worsening abdominal pain and nausea.  The patient was initially admitted to the hospital from 04/26/2021 to 04/28/2021 with perforated appendicitis.  The patient was treated nonoperatively with intravenous Zosyn.  He was discharged home in stable condition with Augmentin.  He developed worsening abdominal pain, and a CT of the abdomen and pelvis on 05/15/2021 revealed interval development of multiple fluid collections concerning for abscesses.  A right transgluteal pelvic drain was placed by IR on 05/20/2021.  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  During this whole time, the patient continue to follow-up with Dr. Constance Haw.  He last saw Dr. Constance Haw on 06/17/2021 at which time there was plan for operative intervention.  He subsequently underwent laparoscopic appendectomy on 06/26/2021, and he was discharged home in stable condition.  The patient states that he has been doing fine up until the last 3 to 4 days when he developed the above symptoms.  As result he presented for further evaluation and treatment. ?In the ED, the patient was afebrile and hemodynamically stable with oxygen saturation 97% on room air.  WBC 14.9, hemoglobin 9.0, platelets 239,000.  BMP showed sodium of 3.6, bicarb 21, serum creatinine 1.87.  CT of the abdomen and pelvis showed a large fluid collection/abscess in the lower abdomen abutting the cecum and appendectomy suture line.  The patient was started on IV Zosyn, IV fluids, and opioids.  General surgery was consulted to assist with management.  ?  ?

## 2021-07-05 NOTE — Progress Notes (Signed)
Pharmacy Antibiotic Note ? ?Blake Rodriguez is a 71 y.o. male admitted on 07/04/2021 with  intra-abdominal infection .  Pharmacy has been consulted for Zosyn dosing. WBC mildly elevated. Noted renal dysfunction.  ? ?Plan: ?Zosyn 3.375G IV q8h to be infused over 4 hours ?Trend WBC, temp, renal function  ?F/U infectious work-up ? ?Height: '5\' 9"'$  (175.3 cm) ?Weight: 70.8 kg (156 lb) ?IBW/kg (Calculated) : 70.7 ? ?Temp (24hrs), Avg:98.2 ?F (36.8 ?C), Min:98.2 ?F (36.8 ?C), Max:98.2 ?F (36.8 ?C) ? ?Recent Labs  ?Lab 07/04/21 ?2057 07/05/21 ?0100  ?WBC 14.9* 14.4*  ?CREATININE 1.87* 1.63*  ?LATICACIDVEN 1.0 1.0  ?  ?Estimated Creatinine Clearance: 42.2 mL/min (A) (by C-G formula based on SCr of 1.63 mg/dL (H)).   ? ?No Known Allergies ? ?Narda Bonds, PharmD, BCPS ?Clinical Pharmacist ?Phone: [phone] ? ?

## 2021-07-05 NOTE — Progress Notes (Signed)
Initial Nutrition Assessment ?RD working remotely. ? ? ?DOCUMENTATION CODES:  ? ?Not applicable ? ?INTERVENTION:  ?- diet advancement as medically feasible. ? ?- complete NFPE when feasible.  ? ? ?NUTRITION DIAGNOSIS:  ? ?Increased nutrient needs related to acute illness, chronic illness as evidenced by estimated needs. ? ?GOAL:  ? ?Patient will meet greater than or equal to 90% of their needs ? ?MONITOR:  ? ?Diet advancement, Labs, Weight trends ? ?REASON FOR ASSESSMENT:  ? ?Malnutrition Screening Tool ? ?ASSESSMENT:  ? ?71 year old male with medical history of HTN, type 2 DM, and HLD. He presented to the ED with 3-4 day history of worsening abdominal pain and nausea. He had CT abdomen on 05/15/21 which was concerning for multiple fluid collections/abscesses. He had a drain placed by IR on 3/21 due to this; it was removed on 4/12. He underwent lap appy on 4/27. In the ED, CT abdomen/pelvis showed a large fluid collection/abscess in the lower abdomen abutting the cecum and appendectomy suture line.  The patient was started on IV Zosyn, IV fluids, and opioids. General surgery was consulted. ? ?He has been NPO since admission. He has not been seen by a [place] RD at any time in the past. ? ?Weight yesterday was 156 lb and has been stable over the past 2 months; weight on 05/06/21 was 157 lb. No information in the edema section of flow sheet. ? ?Per notes: ?- sepsis ?- intra-abdominal abscess ?- plan for perc drainage of abscess by IR ? ? ?Labs reviewed; CBGs: 175, 158, 130 mg/dl, BUN: 31 mg/dl, creatinine: 1.63 mg/dl, Ca: 8.4 mg/dl, GFR: 45 ml/min. ?Medications reviewed; sliding scale novolog. ?IVF; NS @ 75 ml/hr. ?  ? ?NUTRITION - FOCUSED PHYSICAL EXAM: ? ?RD working remotely.  ? ?Diet Order:   ?Diet Order   ? ?       ?  Diet NPO time specified Except for: Sips with Meds  Diet effective midnight       ?  ?  Diet NPO time specified  Diet effective now       ?  ? ?  ?  ? ?  ? ? ?EDUCATION NEEDS:  ? ?Not appropriate  for education at this time ? ?Skin:  Skin Assessment: Reviewed RN Assessment ? ?Last BM:  PTA/unknown ? ?Height:  ? ?Ht Readings from Last 1 Encounters:  ?07/04/21 '5\' 9"'$  (1.753 m)  ? ? ?Weight:  ? ?Wt Readings from Last 1 Encounters:  ?07/04/21 70.8 kg  ? ? ? ?BMI:  Body mass index is 23.04 kg/m?. ? ?Estimated Nutritional Needs:  ?Kcal:  1900-2100 kcal ?Protein:  90-100 grams ?Fluid:  >/= 2 L/day ? ? ? ? ? ?Jarome Matin, MS, RD, LDN ?Registered Dietitian II ?Inpatient Clinical Nutrition ?RD pager # and on-call/weekend pager # available in [place]  ? ?

## 2021-07-05 NOTE — Progress Notes (Signed)
?   07/05/21 2045  ?Vitals  ?Temp (!) 100.7 ?F (38.2 ?C)  ?Temp Source Oral  ?BP (!) 141/84  ?MAP (mmHg) 98  ?BP Location Left Arm  ?BP Method Automatic  ?Patient Position (if appropriate) Lying  ?Pulse Rate (!) 111  ?Pulse Rate Source Monitor  ?ECG Heart Rate (!) 111  ?Resp 19  ?MEWS COLOR  ?MEWS Score Color Yellow  ?Oxygen Therapy  ?SpO2 97 %  ?O2 Device Room Air  ?MEWS Score  ?MEWS Temp 1  ?MEWS Systolic 0  ?MEWS Pulse 2  ?MEWS RR 0  ?MEWS LOC 0  ?MEWS Score 3  ? ?Dr. Josephine Cables notified. Instructed to hold 2200 dose of Metoprolol 12.5 mg PO since last dose was given on 07/05/2021 at [place] [facility]. Temp elevated, new orders for Tylenol.  ?

## 2021-07-05 NOTE — Progress Notes (Signed)
IR at [facility] consulted for intra abdominal abscess, s/p ruptured appendicitis 04/26/21, abscess with drain placement 05/20/21, drain removal 06/11/21, and lap appendectomy 06/26/21.  A few days ago he began with worsening abd pain and nausea, reportedly.  He is afebrile, has a WBC of 14.9, HgB 9.0, Plt 239K.  CT shows fluid collection adjacent to the cecum and suture line.  He has been started on fluids and antibiotics.  Dr. Pascal Lux with IR consulted with team.  Due to limitations of the weekend and the fact the patient has been stable, decision was made to plan for drain placement Monday, May 8.  RN agreed to facilitate the set up of transport to [facility] Monday morning, with arrival to IR at 0830.  Will place NPO orders and plan for consent upon arrival to [facility]. ? ?Electronically Signed: ?Pasty Spillers, PA-C ?07/05/2021, 12:32 PM ? ? ?

## 2021-07-05 NOTE — Assessment & Plan Note (Addendum)
Presented with tachycardia, leukocytosis and intra-abdominal fluid collections ?Secondary to intra-abdominal abscess ?Lactic acid 1.0>>1.0 ?Continued IV Zosyn during hospitalization>>d/c home with amox/clav ?Continued IV fluids ?Sepsis physiology resolved ?

## 2021-07-05 NOTE — Assessment & Plan Note (Signed)
Baseline creatinine 1.4-1.7 ?

## 2021-07-05 NOTE — Progress Notes (Addendum)
PROGRESS NOTE  Blake Rodriguez FAO:[id number] DOB: [birth year] DOA: 07/04/2021 PCP: [facility]  Brief History:  71 year old male with a history of hypertension, diabetes mellitus type 2, hyperlipidemia presenting with 3 to 4-day history of worsening abdominal pain and nausea.  The patient was initially admitted to the hospital from 04/26/2021 to 04/28/2021 with perforated appendicitis.  The patient was treated nonoperatively with intravenous Zosyn.  He was discharged home in stable condition with Augmentin.  He developed worsening abdominal pain, and a CT of the abdomen and pelvis on 05/15/2021 revealed interval development of multiple fluid collections concerning for abscesses.  A right transgluteal pelvic drain was placed by IR on 05/20/2021.  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  During this whole time, the patient continue to follow-up with Dr. Henreitta Leber.  He last saw Dr. Henreitta Leber on 06/17/2021 at which time there was plan for operative intervention.  He subsequently underwent laparoscopic appendectomy on 06/26/2021, and he was discharged home in stable condition.  The patient states that he has been doing fine up until the last 3 to 4 days when he developed the above symptoms.  As result he presented for further evaluation and treatment. In the ED, the patient was afebrile and hemodynamically stable with oxygen saturation 97% on room air.  WBC 14.9, hemoglobin 9.0, platelets 239,000.  BMP showed sodium of 3.6, bicarb 21, serum creatinine 1.87.  CT of the abdomen and pelvis showed a large fluid collection/abscess in the lower abdomen abutting the cecum and appendectomy suture line.  The patient was started on IV Zosyn, IV fluids, and opioids.  General surgery was consulted to assist with management.     Assessment and Plan: * Sepsis due to undetermined organism [facility]) Presented with tachycardia,  leukocytosis and intra-abdominal fluid collections Secondary to intra-abdominal abscess Lactic acid 1.0>>1.0 Continue IV Zosyn Continue IV fluids  Intra-abdominal abscess (HCC) 07/04/21 CT abd--as discussed above General surgery consulted Continue IV Zosyn Remain n.p.o. Convert essential medications to IV if possible Continue IV fluids  Chronic kidney disease, stage 3b (HCC) Baseline creatinine 1.4-1.7  Mixed hyperlipidemia Resume statin once able to tolerate p.o.  Essential hypertension Holding nifedipine, lisinopril until able to tolerate p.o.  Type 2 diabetes mellitus with hyperglycemia (HCC) 04/26/2021 hemoglobin A1c 7.6 Holding metformin and Jardiance NovoLog sliding scale     Family Communication:  no Family at bedside  Consultants:  general surgery  Code Status:  FULL   DVT Prophylaxis:  SCDs   Procedures: As Listed in Progress Note Above  Antibiotics: Zosyn 5/5>>      Subjective: Patient complains of abdominal pain that is controlled with IV opioids.  He has some nausea without any emesis.  He denies any fevers, chills, chest pain, shortness of breath, palpitations, vomiting, diarrhea, hematochezia, melena.  His last bowel movement was on 07/04/2021.  Objective: Vitals:   07/05/21 0100 07/05/21 0327 07/05/21 0523 07/05/21 0749  BP: 140/60 (!) 147/62  (!) 152/66  Pulse: (!) 113 (!) 120  (!) 105  Resp: 19 20 19 19   Temp:  98.2 F (36.8 C)  98.2 F (36.8 C)  TempSrc:  Oral  Oral  SpO2: 96% 100%  98%  Weight:      Height:        Intake/Output Summary (Last 24 hours) at 07/05/2021 0844 Last data filed at 07/05/2021 0617 Gross per 24 hour  Intake 1116.25 ml  Output --  Net 1116.25 ml   Weight change:  Exam:  General:  Pt is alert, follows commands appropriately, not in acute distress HEENT: No icterus, No thrush, No neck mass, [place]/AT Cardiovascular: RRR, S1/S2, no rubs, no gallops Respiratory: CTA bilaterally, no wheezing, no crackles, no  rhonchi Abdomen: Soft/+BS, lower tender, non distended, no guarding Extremities: No edema, No lymphangitis, No petechiae, No rashes, no synovitis   Data Reviewed: I have personally reviewed following labs and imaging studies Basic Metabolic Panel: Recent Labs  Lab 07/04/21 2057 07/05/21 0100  NA 138 138  K 3.6 3.8  CL 105 107  CO2 21* 20*  GLUCOSE 224* 177*  BUN 32* 31*  CREATININE 1.87* 1.63*  CALCIUM 8.9 8.4*  MG  --  2.2  PHOS  --  4.2   Liver Function Tests: Recent Labs  Lab 07/04/21 2057 07/05/21 0100  AST 14* 12*  ALT 9 9  ALKPHOS 58 53  BILITOT 2.2* 1.8*  PROT 7.3 6.8  ALBUMIN 3.2* 3.0*   No results for input(s): LIPASE, AMYLASE in the last 168 hours. No results for input(s): AMMONIA in the last 168 hours. Coagulation Profile: Recent Labs  Lab 07/04/21 2057  INR 1.1   CBC: Recent Labs  Lab 07/04/21 2057 07/05/21 0100  WBC 14.9* 14.4*  NEUTROABS 12.2*  --   HGB 9.0* 9.6*  HCT 28.7* 30.2*  MCV 91.7 92.4  PLT 354 339   Cardiac Enzymes: No results for input(s): CKTOTAL, CKMB, CKMBINDEX, TROPONINI in the last 168 hours. BNP: Invalid input(s): POCBNP CBG: Recent Labs  Lab 07/05/21 0336 07/05/21 0717  GLUCAP 175* 158*   HbA1C: No results for input(s): HGBA1C in the last 72 hours. Urine analysis:    Component Value Date/Time   COLORURINE YELLOW 10/14/2008 0117   APPEARANCEUR CLEAR 10/14/2008 0117   LABSPEC >1.030 (H) 10/14/2008 0117   PHURINE 5.5 10/14/2008 0117   GLUCOSEU >1000 (A) 10/14/2008 0117   HGBUR MODERATE (A) 10/14/2008 0117   BILIRUBINUR NEGATIVE 10/14/2008 0117   KETONESUR NEGATIVE 10/14/2008 0117   PROTEINUR 30 (A) 10/14/2008 0117   UROBILINOGEN 0.2 10/14/2008 0117   NITRITE NEGATIVE 10/14/2008 0117   LEUKOCYTESUR NEGATIVE 10/14/2008 0117   Sepsis Labs: @LABRCNTIP (procalcitonin:4,lacticidven:4) )No results found for this or any previous visit (from the past 240 hour(s)).   Scheduled Meds:  insulin aspart  0-9 Units  Subcutaneous Q4H   Continuous Infusions:  sodium chloride 75 mL/hr at 07/05/21 0524   piperacillin-tazobactam (ZOSYN)  IV 3.375 g (07/05/21 0656)    Procedures/Studies: CT ABDOMEN PELVIS W CONTRAST  Result Date: 07/04/2021 CLINICAL DATA:  Right lower quadrant abdominal pain. History of recent perforated appendicitis. EXAM: CT ABDOMEN AND PELVIS WITH CONTRAST TECHNIQUE: Multidetector CT imaging of the abdomen and pelvis was performed using the standard protocol following bolus administration of intravenous contrast. RADIATION DOSE REDUCTION: This exam was performed according to the departmental dose-optimization program which includes automated exposure control, adjustment of the mA and/or kV according to patient size and/or use of iterative reconstruction technique. CONTRAST:  75mL OMNIPAQUE IOHEXOL 300 MG/ML  SOLN COMPARISON:  Multiple CT of the abdomen pelvis dating back to 04/26/2021. FINDINGS: Lower chest: Emphysematous changes of the visualized lung bases. No intra-abdominal free air. There is a multilobulated fluid collection containing several small pockets of air in the lower abdomen abutting the cecum and appendectomy suture. The fluid collection measures approximately 12 x 7 cm in greatest axial dimensions and 10 cm in craniocaudal length. There  is somewhat organized wall surrounding this collection which extends into the right hemipelvis. Findings most consistent with developing abscess. This may be secondary to sutural dehiscence and leak. Surgical consult is advised. Hepatobiliary: Small cyst in the right lobe of the liver. No intrahepatic biliary ductal dilatation. The gallbladder is unremarkable. Pancreas: Unremarkable. No pancreatic ductal dilatation or surrounding inflammatory changes. Spleen: Normal in size without focal abnormality. Adrenals/Urinary Tract: The adrenal glands are unremarkable. Multiple bilateral renal cysts as seen on the prior CTs. There is no hydronephrosis on either  side. There is symmetric enhancement and excretion of contrast by both kidneys. The visualized ureters and the urinary bladder appear unremarkable. Stomach/Bowel: There is no bowel obstruction. There is moderate stool throughout the colon. Postsurgical changes of appendectomy. Mildly thickened appearance of several loops of small bowel in the lower abdomen, likely reactive to developing abscess. Vascular/Lymphatic: Mild aortoiliac atherosclerotic disease. There is a 2.7 cm infrarenal aortic ectasia. The IVC is unremarkable. No portal venous gas. There is no adenopathy. Reproductive: The prostate and seminal vesicles are grossly unremarkable no pelvic mass. Other: Small fat containing bilateral inguinal hernias. Musculoskeletal: Degenerative changes of the spine. No acute osseous pathology. IMPRESSION: 1. Large fluid collection/abscess in the lower abdomen abutting the cecum and appendectomy suture. Sutural dehiscence and leak is not excluded. Surgical consult is advised. 2. No bowel obstruction. 3. Aortic Atherosclerosis (ICD10-I70.0). Electronically Signed   By: Elgie Collard M.D.   On: 07/04/2021 23:33   DG Sinus/Fist Tube Chk-Non GI  Result Date: 06/11/2021 INDICATION: 71 year old gentleman with history of periappendiceal abscess status post CT-guided drain placement on 05/20/2021 returns to IR clinic for abscessogram. He has not had any significant output from the drain. EXAM: Fluoroscopic abscessogram MEDICATIONS: None ANESTHESIA/SEDATION: None COMPLICATIONS: None immediate. PROCEDURE: Informed written consent was obtained from the patient after a thorough discussion of the procedural risks, benefits and alternatives. A timeout was performed prior to the initiation of the procedure. Scout image demonstrates the pelvic drain in unchanged position. Contrast administered through the drain under fluoroscopy demonstrates no residual cavity or fistulous communication. Drain removed. IMPRESSION: Pelvic  abscessogram demonstrates resolution of abscess and no fistulous communication. Drain removed. Electronically Signed   By: Acquanetta Belling M.D.   On: 06/11/2021 14:29   IR Radiologist Eval & Mgmt  Result Date: 06/11/2021 Please refer to notes tab for details about interventional procedure. (Op Note)   Catarina Hartshorn, DO  [facility]  If 7PM-7AM, please contact night-coverage [email or web address] Password [email or web address] 07/05/2021, 8:44 AM   LOS: 0 days

## 2021-07-05 NOTE — Progress Notes (Signed)
?   07/05/21 1647  ?Assess: MEWS Score  ?Temp 98.7 ?F (37.1 ?C)  ?BP (!) 158/69  ?Pulse Rate (!) 123  ?Resp 19  ?Level of Consciousness Alert  ?SpO2 97 %  ?O2 Device Room Air  ?Assess: MEWS Score  ?MEWS Temp 0  ?MEWS Systolic 0  ?MEWS Pulse 2  ?MEWS RR 0  ?MEWS LOC 0  ?MEWS Score 2  ?MEWS Score Color Yellow  ?Assess: if the MEWS score is Yellow or Red  ?Were vital signs taken at a resting state? Yes  ?Focused Assessment Change from prior assessment (see assessment flowsheet)  ?Early Detection of Sepsis Score *See Row Information* Low  ?MEWS guidelines implemented *See Row Information* Yes  ?Treat  ?Pain Scale 0-10  ?Pain Score 0  ?Take Vital Signs  ?Increase Vital Sign Frequency  Yellow: Q 2hr X 2 then Q 4hr X 2, if remains yellow, continue Q 4hrs  ?Escalate  ?MEWS: Escalate Yellow: discuss with charge nurse/RN and consider discussing with provider and RRT  ?Notify: Charge Nurse/RN  ?Name of Charge Nurse/RN Notified Lowry Ram RN  ?Date Charge Nurse/RN Notified 07/05/21  ?Time Charge Nurse/RN Notified 1653  ?Notify: Provider  ?Provider Name/Title Orson Eva MD  ?Date Provider Notified 07/05/21  ?Time Provider Notified [phone]  ?Notification Type Page ?(Secure chat)  ?Notification Reason Other (Comment) ?(Yellow MEWS)  ?Provider response No new orders  ? ? ?

## 2021-07-05 NOTE — Assessment & Plan Note (Addendum)
07/04/21 CT abd--as discussed above ?General surgery consult appreciated>>IR drain placement on 5/8 ?Continued IV Zosyn during hospitalization>>d/c home with amox/clav ?5/8>>IR drain placed 580 cc dark blood malodorous fluid c/w infected hematoma ?Continued IV fluids during hospitalization ?Follow cultures from aspiration of infected hematoma--discussed with Dr. Ollen Gross will follow and make any abx adjustment ?Family comfortable managing drain at home ?Follow up with Dr. Constance Haw in office ?

## 2021-07-05 NOTE — Progress Notes (Signed)
?   07/05/21 0327  ?Vitals  ?Temp 98.2 ?F (36.8 ?C)  ?Temp Source Oral  ?BP (!) 147/62  ?MAP (mmHg) 84  ?BP Location Right Arm  ?BP Method Automatic  ?Patient Position (if appropriate) Lying  ?Pulse Rate (!) 120  ?Pulse Rate Source Monitor  ?Resp 20  ?MEWS COLOR  ?MEWS Score Color Yellow  ?Oxygen Therapy  ?SpO2 100 %  ?O2 Device Room Air  ?MEWS Score  ?MEWS Temp 0  ?MEWS Systolic 0  ?MEWS Pulse 2  ?MEWS RR 0  ?MEWS LOC 0  ?MEWS Score 2  ? ?Patient new admit to [address], heart rate elevated. MD made aware.  ?

## 2021-07-05 NOTE — Progress Notes (Signed)
Tele called patients heart rate in the 140s, patient was ambulating to the bathroom. Noted heart rate between 105-120 during shift. Heart rate now 125. MD Tat made aware. New orders placed. EKG obtained and placed in patients chart.  ?

## 2021-07-05 NOTE — Assessment & Plan Note (Addendum)
Resume statin 

## 2021-07-05 NOTE — Assessment & Plan Note (Addendum)
04/26/2021 hemoglobin A1c 7.6 ?Holding metformin and Jardiance--restart after d/c ?NovoLog sliding scale ?

## 2021-07-05 NOTE — H&P (Signed)
?History and Physical  ? ? ?Patient: Blake Rodriguez EXB:[id number] DOB: 1951-02-13 ?DOA: 07/04/2021 ?DOS: the patient was seen and examined on 07/05/2021 ?PCP: [facility] [place]  ?Patient coming from: Home ? ?Chief Complaint:  ?Chief Complaint  ?Patient presents with  ? Post-op Problem  ? ?HPI: Blake Rodriguez is a 71 y.o. male with medical history significant of hypertension, hyperlipidemia, T2DM, recent appendicitis initially treated with antibiotics, but developed pelvic abscesses, drains were placed by IR and this was followed by appendectomy (4/27), drains have since been removed.  Patient now presents to the emergency department due to 1 of 2-day onset of abdominal distention and recurrent abdominal pain described to be similar to when he had an abscess.  He endorsed fever of 100 degrees and nausea without vomiting.  He denies constipation or diarrhea.  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  He states that was scheduled for oncology follow-up. ? ?ED Course:  ?In the emergency department, he was tachypneic and other vital signs were within normal range.  Work-up in the ED showed normal CBC except for leukocytosis, BUN/creatinine 32/1.37 (baseline creatinine at 1.4-1.7).  Albumin 3.2, total bilirubin 2.2, lactic acid x2 was 1.0, phosphorus 4.2, magnesium 2.2 ?CT abdomen and pelvis with contrast showed large fluid collection/abscess in the lower abdomen abutting the ?cecum and appendectomy suture. Sutural dehiscence and leak is not excluded. ?He was treated with IV Zosyn, fentanyl was given due to abdominal pain, Zofran was given due to nausea, IV hydration was provided.  General surgery was consulted and recommended admitting the patient with plan to see patient in the morning by ED physician.  Hospitalist was asked to admit patient for further evaluation and management. ? ?Review  of Systems: ?Review of systems as noted in the HPI. All other systems reviewed and are negative. ? ? ?Past Medical History:  ?Diagnosis Date  ? Diabetes mellitus without complication ([place])   ? Hypertension   ? Lung collapse   ? ?Past Surgical History:  ?Procedure Laterality Date  ? IR RADIOLOGIST EVAL & MGMT  05/28/2021  ? IR RADIOLOGIST EVAL & MGMT  06/11/2021  ? LAPAROSCOPIC APPENDECTOMY N/A 06/26/2021  ? Procedure: APPENDECTOMY LAPAROSCOPIC ;  Surgeon: Blake Cagey, MD;  Location: [facility];  Service: General;  Laterality: N/A;  ? LUNG BIOPSY  06/10/1983  ? SHOULDER ARTHROCENTESIS Left   ? ? ?Social History:  reports that he quit smoking about 28 years ago. His smoking use included cigarettes. He has a 15.00 pack-year smoking history. He has never used smokeless tobacco. He reports current alcohol use. He reports that he does not use drugs. ? ? ?No Known Allergies ? ?History reviewed. No pertinent family history.  ? ?Prior to Admission medications   ?Medication Sig Start Date End Date Taking? Authorizing Provider  ?acetaminophen (TYLENOL) 325 MG tablet Take 2 tablets (650 mg total) by mouth every 6 (six) hours as needed for mild pain, fever or headache. 04/28/21   Roxan Hockey, MD  ?acetaminophen (TYLENOL) 500 MG tablet Take 1,000 mg by mouth every 6 (six) hours as needed for moderate pain.    [provider]  ?Alogliptin Benzoate 12.5 MG TABS Take 12.5 mg by mouth daily.    [provider]  ?aspirin EC 81 MG tablet Take 1 tablet (81 mg total) by mouth daily with breakfast. 04/28/21   Roxan Hockey, MD  ?  atorvastatin (LIPITOR) 80 MG tablet Take 80 mg by mouth daily. 08/19/17   [provider]  ?Cholecalciferol (VITAMIN D3 PO) Take 2 capsules by mouth daily.    [provider]  ?Cyanocobalamin (B-12 PO) Take 5 tablets by mouth daily.    [provider]  ?empagliflozin (JARDIANCE) 25 MG TABS tablet Take 25 mg by mouth daily. 10/15/20   [provider]   ?ezetimibe (ZETIA) 10 MG tablet Take 10 mg by mouth daily.    [provider]  ?lisinopril (PRINIVIL,ZESTRIL) 40 MG tablet Take 40 mg by mouth daily. 08/03/17   [provider]  ?metFORMIN (GLUCOPHAGE-XR) 500 MG 24 hr tablet Take 500 mg by mouth 2 (two) times daily. 08/23/17   [provider]  ?NIFEdipine (PROCARDIA XL/NIFEDICAL-XL) 90 MG 24 hr tablet Take 90 mg by mouth daily.    [provider]  ?ondansetron (ZOFRAN) 4 MG tablet Take 1 tablet (4 mg total) by mouth every 8 (eight) hours as needed. 06/26/21 06/26/22  Blake Cagey, MD  ?oxyCODONE (ROXICODONE) 5 MG immediate release tablet Take 1 tablet (5 mg total) by mouth every 4 (four) hours as needed for breakthrough pain or severe pain. 06/26/21 06/26/22  Blake Cagey, MD  ?pioglitazone (ACTOS) 15 MG tablet Take 15 mg by mouth daily.    [provider]  ?sodium chloride flush (NS) 0.9 % SOLN Place 10 mLs into feeding tube 2 (two) times daily. Flush catheter with 5-10 mLs daily. 05/21/21   Docia Barrier, PA  ?tiZANidine (ZANAFLEX) 4 MG tablet Take 1 tablet (4 mg total) by mouth at bedtime. ?Patient taking differently: Take 4 mg by mouth at bedtime as needed for muscle spasms. 05/04/21   Jaynee Eagles, PA-C  ? ? ?Physical Exam: ?BP (!) 147/62 (BP Location: Right Arm)   Pulse (!) 120   Temp 98.2 ?F (36.8 ?C) (Oral)   Resp 20   Ht '5\' 9"'$  (1.753 m)   Wt 70.8 kg   SpO2 100%   BMI 23.04 kg/m?  ? ?General: 71 y.o. year-old male well developed well nourished in no acute distress.  Alert and oriented x3. ?HEENT: NCAT, EOMI ?Neck: Supple, trachea medial ?Cardiovascular: Tachycardia regular rate and rhythm with no rubs or gallops.  No thyromegaly or JVD noted.  No lower extremity edema. 2/4 pulses in all 4 extremities. ?Respiratory: Clear to auscultation with no wheezes or rales. Good inspiratory effort. ?Abdomen: Soft, tender to palpation.  Nondistended with normal bowel sounds x4 quadrants. ?Muskuloskeletal:  No cyanosis, clubbing or edema noted bilaterally ?Neuro: CN II-XII intact, strength 5/5 x 4, sensation, reflexes intact ?Skin: No ulcerative lesions noted or rashes ?Psychiatry: Judgement and insight appear normal. Mood is appropriate for condition and setting ?   ?   ?   ?Labs on Admission:  ?Basic Metabolic Panel: ?Recent Labs  ?Lab 07/04/21 ?2057 07/05/21 ?0100  ?NA 138 138  ?K 3.6 3.8  ?CL 105 107  ?CO2 21* 20*  ?GLUCOSE 224* 177*  ?BUN 32* 31*  ?CREATININE 1.87* 1.63*  ?CALCIUM 8.9 8.4*  ?MG  --  2.2  ?PHOS  --  4.2  ? ?Liver Function Tests: ?Recent Labs  ?Lab 07/04/21 ?2057 07/05/21 ?0100  ?AST 14* 12*  ?ALT 9 9  ?ALKPHOS 58 53  ?BILITOT 2.2* 1.8*  ?PROT 7.3 6.8  ?ALBUMIN 3.2* 3.0*  ? ?No results for input(s): LIPASE, AMYLASE in the last 168 hours. ?No results for input(s): AMMONIA in the last 168 hours. ?CBC: ?Recent Labs  ?  Lab 07/04/21 ?2057 07/05/21 ?0100  ?WBC 14.9* 14.4*  ?NEUTROABS 12.2*  --   ?HGB 9.0* 9.6*  ?HCT 28.7* 30.2*  ?MCV 91.7 92.4  ?PLT 354 339  ? ?Cardiac Enzymes: ?No results for input(s): CKTOTAL, CKMB, CKMBINDEX, TROPONINI in the last 168 hours. ? ?BNP (last 3 results) ?No results for input(s): BNP in the last 8760 hours. ? ?ProBNP (last 3 results) ?No results for input(s): PROBNP in the last 8760 hours. ? ?CBG: ?Recent Labs  ?Lab 07/05/21 ?0336  ?GLUCAP 175*  ? ? ?Radiological Exams on Admission: ?CT ABDOMEN PELVIS W CONTRAST ? ?Result Date: 07/04/2021 ?CLINICAL DATA:  Right lower quadrant abdominal pain. History of recent perforated appendicitis. EXAM: CT ABDOMEN AND PELVIS WITH CONTRAST TECHNIQUE: Multidetector CT imaging of the abdomen and pelvis was performed using the standard protocol following bolus administration of intravenous contrast. RADIATION DOSE REDUCTION: This exam was performed according to the departmental dose-optimization program which includes automated exposure control, adjustment of the mA and/or kV according to patient size and/or use of iterative reconstruction  technique. CONTRAST:  38m OMNIPAQUE IOHEXOL 300 MG/ML  SOLN COMPARISON:  Multiple CT of the abdomen pelvis dating back to 04/26/2021. FINDINGS: Lower chest: Emphysematous changes of the visualized lung bases. No intr

## 2021-07-06 DIAGNOSIS — N1832 Chronic kidney disease, stage 3b: Secondary | ICD-10-CM

## 2021-07-06 DIAGNOSIS — A419 Sepsis, unspecified organism: Secondary | ICD-10-CM | POA: Diagnosis not present

## 2021-07-06 DIAGNOSIS — K651 Peritoneal abscess: Secondary | ICD-10-CM | POA: Diagnosis not present

## 2021-07-06 DIAGNOSIS — I10 Essential (primary) hypertension: Secondary | ICD-10-CM | POA: Diagnosis not present

## 2021-07-06 LAB — CBC
HCT: 30.6 % — ABNORMAL LOW (ref 39.0–52.0)
Hemoglobin: 9.4 g/dL — ABNORMAL LOW (ref 13.0–17.0)
MCH: 28.3 pg (ref 26.0–34.0)
MCHC: 30.7 g/dL (ref 30.0–36.0)
MCV: 92.2 fL (ref 80.0–100.0)
Platelets: 408 10*3/uL — ABNORMAL HIGH (ref 150–400)
RBC: 3.32 MIL/uL — ABNORMAL LOW (ref 4.22–5.81)
RDW: 16.7 % — ABNORMAL HIGH (ref 11.5–15.5)
WBC: 15.7 10*3/uL — ABNORMAL HIGH (ref 4.0–10.5)
nRBC: 0 % (ref 0.0–0.2)

## 2021-07-06 LAB — GLUCOSE, CAPILLARY
Glucose-Capillary: 171 mg/dL — ABNORMAL HIGH (ref 70–99)
Glucose-Capillary: 118 mg/dL — ABNORMAL HIGH (ref 70–99)
Glucose-Capillary: 181 mg/dL — ABNORMAL HIGH (ref 70–99)
Glucose-Capillary: 180 mg/dL — ABNORMAL HIGH (ref 70–99)
Glucose-Capillary: 153 mg/dL — ABNORMAL HIGH (ref 70–99)

## 2021-07-06 LAB — BASIC METABOLIC PANEL
Anion gap: 13 (ref 5–15)
BUN: 24 mg/dL — ABNORMAL HIGH (ref 8–23)
CO2: 17 mmol/L — ABNORMAL LOW (ref 22–32)
Calcium: 8.4 mg/dL — ABNORMAL LOW (ref 8.9–10.3)
Chloride: 113 mmol/L — ABNORMAL HIGH (ref 98–111)
Creatinine, Ser: 1.44 mg/dL — ABNORMAL HIGH (ref 0.61–1.24)
GFR, Estimated: 52 mL/min — ABNORMAL LOW (ref >60)
Glucose, Bld: 166 mg/dL — ABNORMAL HIGH (ref 70–99)
Potassium: 3.7 mmol/L (ref 3.5–5.1)
Sodium: 143 mmol/L (ref 135–145)

## 2021-07-06 LAB — MAGNESIUM: Magnesium: 2.2 mg/dL (ref 1.7–2.4)

## 2021-07-06 MED ORDER — METOPROLOL TARTRATE 5 MG/5ML IV SOLN
2.5000 mg | Freq: Four times a day (QID) | INTRAVENOUS | Status: AC
  Administered 2021-07-06 – 2021-07-07 (×5): 2.5 mg via INTRAVENOUS
  Filled 2021-07-06 (×5): qty 5

## 2021-07-06 NOTE — Progress Notes (Signed)
?Subjective: ?Patient feels better today.  Just had a bowel movement.  No nausea or vomiting noted. ? ?Objective: ?Vital signs in last 24 hours: ?Temp:  [97.9 ?F (36.6 ?C)-100.7 ?F (38.2 ?C)] 99.6 ?F (37.6 ?C) (05/07 0456) ?Pulse Rate:  [100-127] 122 (05/07 0456) ?Resp:  [16-20] 20 (05/07 0456) ?BP: (141-159)/(65-84) 159/70 (05/07 0456) ?SpO2:  [97 %-100 %] 99 % (05/07 0456) ?Last BM Date : 07/06/21 ? ?Intake/Output from previous day: ?05/06 0701 - 05/07 0700 ?In: 3376.9 [P.O.:480; I.V.:1685; IV Piggyback:1211.9] ?Out: 800 [Urine:800] ?Intake/Output this shift: ?Total I/O ?In: -  ?Out: 400 [Urine:400] ? ?General appearance: alert, cooperative, and no distress ?GI: Soft, mildly distended but not tense.  Incisions healing well. No rigidity noted.  Bowel sounds present. ? ?Lab Results:  ?Recent Labs  ?  07/05/21 ?0100 07/06/21 ?0423  ?WBC 14.4* 15.7*  ?HGB 9.6* 9.4*  ?HCT 30.2* 30.6*  ?PLT 339 408*  ? ?BMET ?Recent Labs  ?  07/05/21 ?0100 07/06/21 ?0423  ?NA 138 143  ?K 3.8 3.7  ?CL 107 113*  ?CO2 20* 17*  ?GLUCOSE 177* 166*  ?BUN 31* 24*  ?CREATININE 1.63* 1.44*  ?CALCIUM 8.4* 8.4*  ? ?PT/INR ?Recent Labs  ?  07/04/21 ?2057  ?LABPROT 14.5  ?INR 1.1  ? ? ?Studies/Results: ?CT ABDOMEN PELVIS W CONTRAST ? ?Result Date: 07/04/2021 ?CLINICAL DATA:  Right lower quadrant abdominal pain. History of recent perforated appendicitis. EXAM: CT ABDOMEN AND PELVIS WITH CONTRAST TECHNIQUE: Multidetector CT imaging of the abdomen and pelvis was performed using the standard protocol following bolus administration of intravenous contrast. RADIATION DOSE REDUCTION: This exam was performed according to the departmental dose-optimization program which includes automated exposure control, adjustment of the mA and/or kV according to patient size and/or use of iterative reconstruction technique. CONTRAST:  40m OMNIPAQUE IOHEXOL 300 MG/ML  SOLN COMPARISON:  Multiple CT of the abdomen pelvis dating back to 04/26/2021. FINDINGS: Lower chest:  Emphysematous changes of the visualized lung bases. No intra-abdominal free air. There is a multilobulated fluid collection containing several small pockets of air in the lower abdomen abutting the cecum and appendectomy suture. The fluid collection measures approximately 12 x 7 cm in greatest axial dimensions and 10 cm in craniocaudal length. There is somewhat organized wall surrounding this collection which extends into the right hemipelvis. Findings most consistent with developing abscess. This may be secondary to sutural dehiscence and leak. Surgical consult is advised. Hepatobiliary: Small cyst in the right lobe of the liver. No intrahepatic biliary ductal dilatation. The gallbladder is unremarkable. Pancreas: Unremarkable. No pancreatic ductal dilatation or surrounding inflammatory changes. Spleen: Normal in size without focal abnormality. Adrenals/Urinary Tract: The adrenal glands are unremarkable. Multiple bilateral renal cysts as seen on the prior CTs. There is no hydronephrosis on either side. There is symmetric enhancement and excretion of contrast by both kidneys. The visualized ureters and the urinary bladder appear unremarkable. Stomach/Bowel: There is no bowel obstruction. There is moderate stool throughout the colon. Postsurgical changes of appendectomy. Mildly thickened appearance of several loops of small bowel in the lower abdomen, likely reactive to developing abscess. Vascular/Lymphatic: Mild aortoiliac atherosclerotic disease. There is a 2.7 cm infrarenal aortic ectasia. The IVC is unremarkable. No portal venous gas. There is no adenopathy. Reproductive: The prostate and seminal vesicles are grossly unremarkable no pelvic mass. Other: Small fat containing bilateral inguinal hernias. Musculoskeletal: Degenerative changes of the spine. No acute osseous pathology. IMPRESSION: 1. Large fluid collection/abscess in the lower abdomen abutting the cecum and appendectomy suture. Sutural  dehiscence and  leak is not excluded. Surgical consult is advised. 2. No bowel obstruction. 3. Aortic Atherosclerosis (ICD10-I70.0). Electronically Signed   By: Anner Crete M.D.   On: 07/04/2021 23:33   ? ?Anti-infectives: ?Anti-infectives (From admission, onward)  ? ? Start     Dose/Rate Route Frequency Ordered Stop  ? 07/05/21 0715  piperacillin-tazobactam (ZOSYN) IVPB 3.375 g       ? 3.375 g ?12.5 mL/hr over 240 Minutes Intravenous Every 8 hours 07/05/21 0623    ? 07/04/21 2345  piperacillin-tazobactam (ZOSYN) IVPB 3.375 g       ? 3.375 g ?100 mL/hr over 30 Minutes Intravenous  Once 07/04/21 2333 07/05/21 0045  ? ?  ? ? 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 ?Plan: Patient is scheduled for IR drainage of intra-abdominal abscess tomorrow. ? LOS: 1 day  ? ? ?Aviva Signs ?07/06/2021  ?

## 2021-07-06 NOTE — Progress Notes (Signed)
?   07/06/21 0456  ?Vitals  ?Temp 99.6 ?F (37.6 ?C)  ?Temp Source Oral  ?BP (!) 159/70  ?MAP (mmHg) 94  ?BP Location Left Arm  ?BP Method Automatic  ?Patient Position (if appropriate) Lying  ?Pulse Rate (!) 122  ?Pulse Rate Source Monitor  ?Resp 20  ?MEWS COLOR  ?MEWS Score Color Yellow  ?Oxygen Therapy  ?SpO2 99 %  ?O2 Device Room Air  ?MEWS Score  ?MEWS Temp 0  ?MEWS Systolic 0  ?MEWS Pulse 2  ?MEWS RR 0  ?MEWS LOC 0  ?MEWS Score 2  ?Provider Notification  ?Provider Name/Title Dr. Josephine Cables  ?Date Provider Notified 07/06/21  ?Time Provider Notified [phone]  ?Method of Notification Page  ? ?MEWS Yellow, Instructed by Dr. Josephine Cables to give Tylenol 650 mg PO.  ?

## 2021-07-06 NOTE — Progress Notes (Signed)
[facility] called and transportation set up for patient arrival to [facility] at 0830 on 07-07-2021. ?

## 2021-07-06 NOTE — Assessment & Plan Note (Addendum)
Baseline creatinine 1.4-1.7 ?Serum creatinine 1.56 on day of dc ?

## 2021-07-06 NOTE — Progress Notes (Addendum)
PROGRESS NOTE  Blake Rodriguez WUJ:[id number] DOB: 06/27/1950 DOA: 07/04/2021 PCP: [facility]  Brief History:  71 year old male with a history of hypertension, diabetes mellitus type 2, hyperlipidemia presenting with 3 to 4-day history of worsening abdominal pain and nausea.  The patient was initially admitted to the hospital from 04/26/2021 to 04/28/2021 with perforated appendicitis.  The patient was treated nonoperatively with intravenous Zosyn.  He was discharged home in stable condition with Augmentin.  He developed worsening abdominal pain, and a CT of the abdomen and pelvis on 05/15/2021 revealed interval development of multiple fluid collections concerning for abscesses.  A right transgluteal pelvic drain was placed by IR on 05/20/2021.  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  During this whole time, the patient continue to follow-up with Dr. Henreitta Leber.  He last saw Dr. Henreitta Leber on 06/17/2021 at which time there was plan for operative intervention.  He subsequently underwent laparoscopic appendectomy on 06/26/2021, and he was discharged home in stable condition.  The patient states that he has been doing fine up until the last 3 to 4 days when he developed the above symptoms.  As result he presented for further evaluation and treatment. In the ED, the patient was afebrile and hemodynamically stable with oxygen saturation 97% on room air.  WBC 14.9, hemoglobin 9.0, platelets 239,000.  BMP showed sodium of 3.6, bicarb 21, serum creatinine 1.87.  CT of the abdomen and pelvis showed a large fluid collection/abscess in the lower abdomen abutting the cecum and appendectomy suture line.  The patient was started on IV Zosyn, IV fluids, and opioids.  General surgery was consulted to assist with management.      Assessment and Plan: * Sepsis due to undetermined organism [facility]) Presented with tachycardia,  leukocytosis and intra-abdominal fluid collections Secondary to intra-abdominal abscess Lactic acid 1.0>>1.0 Continue IV Zosyn Continue IV fluids  Intra-abdominal abscess (HCC) 07/04/21 CT abd--as discussed above General surgery consult appreciated>>IR drain placement on 5/8 Continue IV Zosyn Remain n.p.o. after MN tonight Convert essential medications to IV if possible Continue IV fluids WBC up a little today  Chronic kidney disease, stage 3b (HCC) Baseline creatinine 1.4-1.7 Monitor;  Am bmp  Mixed hyperlipidemia Resume statin once able to tolerate p.o.  Essential hypertension Holding nifedipine, lisinopril  -IV metoprolol for now  Type 2 diabetes mellitus with hyperglycemia (HCC) 04/26/2021 hemoglobin A1c 7.6 Holding metformin and Jardiance NovoLog sliding scale          Family Communication:   spouse updated at bedside 5/7  Consultants:  general surgery  Code Status:  FULL   DVT Prophylaxis:  SCDs   Procedures: As Listed in Progress Note Above  Antibiotics: Zosyn 5/5>>        Subjective: Patient feels abd pain is a little better.  Denies f/c, cp, sob.  No vomiting.  Has occasional nausea.  Objective: Vitals:   07/06/21 0027 07/06/21 0456 07/06/21 1250 07/06/21 1623  BP: (!) 156/66 (!) 159/70 (!) 148/77 (!) 154/64  Pulse: (!) 104 (!) 122 (!) 113 (!) 116  Resp: 16 20 18 18   Temp: 97.9 F (36.6 C) 99.6 F (37.6 C) 97.7 F (36.5 C) 98.2 F (36.8 C)  TempSrc: Oral Oral Oral Oral  SpO2: 100% 99% 99% 99%  Weight:      Height:        Intake/Output Summary (Last 24 hours)  at 07/06/2021 1730 Last data filed at 07/06/2021 1251 Gross per 24 hour  Intake 2710.48 ml  Output 1200 ml  Net 1510.48 ml   Weight change:  Exam:  General:  Pt is alert, follows commands appropriately, not in acute distress HEENT: No icterus, No thrush, No neck mass, [place]/AT Cardiovascular: RRR, S1/S2, no rubs, no gallops Respiratory: diminished BS, but CTA Abdomen:  Soft/+BS, RLQ tender, non distended, no guarding Extremities: No edema, No lymphangitis, No petechiae, No rashes, no synovitis   Data Reviewed: I have personally reviewed following labs and imaging studies Basic Metabolic Panel: Recent Labs  Lab 07/04/21 2057 07/05/21 0100 07/06/21 0423  NA 138 138 143  K 3.6 3.8 3.7  CL 105 107 113*  CO2 21* 20* 17*  GLUCOSE 224* 177* 166*  BUN 32* 31* 24*  CREATININE 1.87* 1.63* 1.44*  CALCIUM 8.9 8.4* 8.4*  MG  --  2.2 2.2  PHOS  --  4.2  --    Liver Function Tests: Recent Labs  Lab 07/04/21 2057 07/05/21 0100  AST 14* 12*  ALT 9 9  ALKPHOS 58 53  BILITOT 2.2* 1.8*  PROT 7.3 6.8  ALBUMIN 3.2* 3.0*   No results for input(s): LIPASE, AMYLASE in the last 168 hours. No results for input(s): AMMONIA in the last 168 hours. Coagulation Profile: Recent Labs  Lab 07/04/21 2057  INR 1.1   CBC: Recent Labs  Lab 07/04/21 2057 07/05/21 0100 07/06/21 0423  WBC 14.9* 14.4* 15.7*  NEUTROABS 12.2*  --   --   HGB 9.0* 9.6* 9.4*  HCT 28.7* 30.2* 30.6*  MCV 91.7 92.4 92.2  PLT 354 339 408*   Cardiac Enzymes: No results for input(s): CKTOTAL, CKMB, CKMBINDEX, TROPONINI in the last 168 hours. BNP: Invalid input(s): POCBNP CBG: Recent Labs  Lab 07/05/21 2346 07/06/21 0359 07/06/21 0721 07/06/21 1106 07/06/21 1618  GLUCAP 147* 171* 118* 181* 180*   HbA1C: No results for input(s): HGBA1C in the last 72 hours. Urine analysis:    Component Value Date/Time   COLORURINE YELLOW 10/14/2008 0117   APPEARANCEUR CLEAR 10/14/2008 0117   LABSPEC >1.030 (H) 10/14/2008 0117   PHURINE 5.5 10/14/2008 0117   GLUCOSEU >1000 (A) 10/14/2008 0117   HGBUR MODERATE (A) 10/14/2008 0117   BILIRUBINUR NEGATIVE 10/14/2008 0117   KETONESUR NEGATIVE 10/14/2008 0117   PROTEINUR 30 (A) 10/14/2008 0117   UROBILINOGEN 0.2 10/14/2008 0117   NITRITE NEGATIVE 10/14/2008 0117   LEUKOCYTESUR NEGATIVE 10/14/2008 0117   Sepsis  Labs: @LABRCNTIP (procalcitonin:4,lacticidven:4) )No results found for this or any previous visit (from the past 240 hour(s)).   Scheduled Meds:  insulin aspart  0-9 Units Subcutaneous Q4H   metoprolol tartrate  2.5 mg Intravenous Q6H   Continuous Infusions:  sodium chloride 75 mL/hr at 07/06/21 0722   piperacillin-tazobactam (ZOSYN)  IV 3.375 g (07/06/21 1510)    Procedures/Studies: CT ABDOMEN PELVIS W CONTRAST  Result Date: 07/04/2021 CLINICAL DATA:  Right lower quadrant abdominal pain. History of recent perforated appendicitis. EXAM: CT ABDOMEN AND PELVIS WITH CONTRAST TECHNIQUE: Multidetector CT imaging of the abdomen and pelvis was performed using the standard protocol following bolus administration of intravenous contrast. RADIATION DOSE REDUCTION: This exam was performed according to the departmental dose-optimization program which includes automated exposure control, adjustment of the mA and/or kV according to patient size and/or use of iterative reconstruction technique. CONTRAST:  75mL OMNIPAQUE IOHEXOL 300 MG/ML  SOLN COMPARISON:  Multiple CT of the abdomen pelvis dating back to 04/26/2021. FINDINGS: Lower chest:  Emphysematous changes of the visualized lung bases. No intra-abdominal free air. There is a multilobulated fluid collection containing several small pockets of air in the lower abdomen abutting the cecum and appendectomy suture. The fluid collection measures approximately 12 x 7 cm in greatest axial dimensions and 10 cm in craniocaudal length. There is somewhat organized wall surrounding this collection which extends into the right hemipelvis. Findings most consistent with developing abscess. This may be secondary to sutural dehiscence and leak. Surgical consult is advised. Hepatobiliary: Small cyst in the right lobe of the liver. No intrahepatic biliary ductal dilatation. The gallbladder is unremarkable. Pancreas: Unremarkable. No pancreatic ductal dilatation or surrounding  inflammatory changes. Spleen: Normal in size without focal abnormality. Adrenals/Urinary Tract: The adrenal glands are unremarkable. Multiple bilateral renal cysts as seen on the prior CTs. There is no hydronephrosis on either side. There is symmetric enhancement and excretion of contrast by both kidneys. The visualized ureters and the urinary bladder appear unremarkable. Stomach/Bowel: There is no bowel obstruction. There is moderate stool throughout the colon. Postsurgical changes of appendectomy. Mildly thickened appearance of several loops of small bowel in the lower abdomen, likely reactive to developing abscess. Vascular/Lymphatic: Mild aortoiliac atherosclerotic disease. There is a 2.7 cm infrarenal aortic ectasia. The IVC is unremarkable. No portal venous gas. There is no adenopathy. Reproductive: The prostate and seminal vesicles are grossly unremarkable no pelvic mass. Other: Small fat containing bilateral inguinal hernias. Musculoskeletal: Degenerative changes of the spine. No acute osseous pathology. IMPRESSION: 1. Large fluid collection/abscess in the lower abdomen abutting the cecum and appendectomy suture. Sutural dehiscence and leak is not excluded. Surgical consult is advised. 2. No bowel obstruction. 3. Aortic Atherosclerosis (ICD10-I70.0). Electronically Signed   By: Elgie Collard M.D.   On: 07/04/2021 23:33   DG Sinus/Fist Tube Chk-Non GI  Result Date: 06/11/2021 INDICATION: 71 year old gentleman with history of periappendiceal abscess status post CT-guided drain placement on 05/20/2021 returns to IR clinic for abscessogram. He has not had any significant output from the drain. EXAM: Fluoroscopic abscessogram MEDICATIONS: None ANESTHESIA/SEDATION: None COMPLICATIONS: None immediate. PROCEDURE: Informed written consent was obtained from the patient after a thorough discussion of the procedural risks, benefits and alternatives. A timeout was performed prior to the initiation of the  procedure. Scout image demonstrates the pelvic drain in unchanged position. Contrast administered through the drain under fluoroscopy demonstrates no residual cavity or fistulous communication. Drain removed. IMPRESSION: Pelvic abscessogram demonstrates resolution of abscess and no fistulous communication. Drain removed. Electronically Signed   By: Acquanetta Belling M.D.   On: 06/11/2021 14:29   IR Radiologist Eval & Mgmt  Result Date: 06/11/2021 Please refer to notes tab for details about interventional procedure. (Op Note)   Catarina Hartshorn, DO  [facility]  If 7PM-7AM, please contact night-coverage [email or web address] Password [email or web address] 07/06/2021, 5:30 PM   LOS: 1 day

## 2021-07-07 ENCOUNTER — Ambulatory Visit (HOSPITAL_COMMUNITY): Payer: No Typology Code available for payment source

## 2021-07-07 ENCOUNTER — Encounter (HOSPITAL_COMMUNITY): Payer: Self-pay | Admitting: Internal Medicine

## 2021-07-07 ENCOUNTER — Inpatient Hospital Stay (HOSPITAL_COMMUNITY): Payer: No Typology Code available for payment source

## 2021-07-07 DIAGNOSIS — N1832 Chronic kidney disease, stage 3b: Secondary | ICD-10-CM

## 2021-07-07 DIAGNOSIS — I10 Essential (primary) hypertension: Secondary | ICD-10-CM | POA: Diagnosis not present

## 2021-07-07 DIAGNOSIS — A419 Sepsis, unspecified organism: Secondary | ICD-10-CM

## 2021-07-07 DIAGNOSIS — K651 Peritoneal abscess: Secondary | ICD-10-CM | POA: Diagnosis not present

## 2021-07-07 LAB — CBC
HCT: 27.1 % — ABNORMAL LOW (ref 39.0–52.0)
Hemoglobin: 8.7 g/dL — ABNORMAL LOW (ref 13.0–17.0)
MCH: 29.6 pg (ref 26.0–34.0)
MCHC: 32.1 g/dL (ref 30.0–36.0)
MCV: 92.2 fL (ref 80.0–100.0)
Platelets: 382 10*3/uL (ref 150–400)
RBC: 2.94 MIL/uL — ABNORMAL LOW (ref 4.22–5.81)
RDW: 17 % — ABNORMAL HIGH (ref 11.5–15.5)
WBC: 15.5 10*3/uL — ABNORMAL HIGH (ref 4.0–10.5)
nRBC: 0 % (ref 0.0–0.2)

## 2021-07-07 LAB — BASIC METABOLIC PANEL
Anion gap: 12 (ref 5–15)
BUN: 22 mg/dL (ref 8–23)
CO2: 17 mmol/L — ABNORMAL LOW (ref 22–32)
Calcium: 8.3 mg/dL — ABNORMAL LOW (ref 8.9–10.3)
Chloride: 116 mmol/L — ABNORMAL HIGH (ref 98–111)
Creatinine, Ser: 1.68 mg/dL — ABNORMAL HIGH (ref 0.61–1.24)
GFR, Estimated: 43 mL/min — ABNORMAL LOW (ref >60)
Glucose, Bld: 162 mg/dL — ABNORMAL HIGH (ref 70–99)
Potassium: 3.3 mmol/L — ABNORMAL LOW (ref 3.5–5.1)
Sodium: 145 mmol/L (ref 135–145)

## 2021-07-07 LAB — FOLATE: Folate: 8.2 ng/mL (ref >5.9)

## 2021-07-07 LAB — GLUCOSE, CAPILLARY
Glucose-Capillary: 118 mg/dL — ABNORMAL HIGH (ref 70–99)
Glucose-Capillary: 151 mg/dL — ABNORMAL HIGH (ref 70–99)
Glucose-Capillary: 153 mg/dL — ABNORMAL HIGH (ref 70–99)
Glucose-Capillary: 158 mg/dL — ABNORMAL HIGH (ref 70–99)
Glucose-Capillary: 160 mg/dL — ABNORMAL HIGH (ref 70–99)
Glucose-Capillary: 165 mg/dL — ABNORMAL HIGH (ref 70–99)

## 2021-07-07 LAB — IRON AND TIBC
Iron: 11 ug/dL — ABNORMAL LOW (ref 45–182)
Saturation Ratios: 8 % — ABNORMAL LOW (ref 17.9–39.5)
TIBC: 133 ug/dL — ABNORMAL LOW (ref 250–450)
UIBC: 122 ug/dL

## 2021-07-07 LAB — MAGNESIUM: Magnesium: 2.1 mg/dL (ref 1.7–2.4)

## 2021-07-07 LAB — FERRITIN: Ferritin: 1183 ng/mL — ABNORMAL HIGH (ref 24–336)

## 2021-07-07 LAB — VITAMIN B12: Vitamin B-12: 281 pg/mL (ref 180–914)

## 2021-07-07 MED ORDER — INSULIN ASPART 100 UNIT/ML IJ SOLN
0.0000 [IU] | Freq: Three times a day (TID) | INTRAMUSCULAR | Status: DC
  Administered 2021-07-08: 1 [IU] via SUBCUTANEOUS
  Administered 2021-07-08: 2 [IU] via SUBCUTANEOUS

## 2021-07-07 MED ORDER — FENTANYL CITRATE (PF) 100 MCG/2ML IJ SOLN
INTRAMUSCULAR | Status: AC | PRN
  Administered 2021-07-07 (×2): 25 ug via INTRAVENOUS

## 2021-07-07 MED ORDER — SODIUM CHLORIDE 0.9% FLUSH
5.0000 mL | Freq: Three times a day (TID) | INTRAVENOUS | Status: DC
  Administered 2021-07-07: 5 mL

## 2021-07-07 MED ORDER — METOPROLOL TARTRATE 25 MG PO TABS
25.0000 mg | ORAL_TABLET | Freq: Two times a day (BID) | ORAL | Status: DC
  Administered 2021-07-07 – 2021-07-08 (×2): 25 mg via ORAL
  Filled 2021-07-07 (×2): qty 1

## 2021-07-07 MED ORDER — MIDAZOLAM HCL 2 MG/2ML IJ SOLN
INTRAMUSCULAR | Status: AC | PRN
Start: 2021-07-07 — End: 2021-07-07
  Administered 2021-07-07 (×2): 1 mg via INTRAVENOUS

## 2021-07-07 NOTE — Procedures (Signed)
Interventional Radiology Procedure Note ? ?Procedure: CT ANTERIOR LOWER ABD DRAIN   ? ?Complications: None ? ?Estimated Blood Loss:  MIN ? 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 ?CX SENT   ? ?M. Daryll Brod, MD ? ? ? ?

## 2021-07-07 NOTE — Progress Notes (Signed)
Patient returned from [facility] , drain intact and draining dark blood, No c/o pain or discomfort., VSS.   ? ? ? 07/07/21 1241  ?Vitals  ?Temp 98.2 ?F (36.8 ?C)  ?Temp Source Oral  ?BP (!) 156/70  ?MAP (mmHg) 94  ?BP Method Automatic  ?Pulse Rate (!) 107  ?Pulse Rate Source Monitor  ?Resp 20  ?Level of Consciousness  ?Level of Consciousness Alert  ?MEWS COLOR  ?MEWS Score Color Green  ?Oxygen Therapy  ?SpO2 100 %  ?O2 Device Room Air  ?MEWS Score  ?MEWS Temp 0  ?MEWS Systolic 0  ?MEWS Pulse 1  ?MEWS RR 0  ?MEWS LOC 0  ?MEWS Score 1  ? ? ?

## 2021-07-07 NOTE — Progress Notes (Signed)
?  ?       ?PROGRESS NOTE ? ?CLINE DRAHEIM JOI:[id number] DOB: [birth year] DOA: 07/04/2021 ?PCP: [facility] [place] ? ?Brief History:  ?71 year old male with a history of hypertension, diabetes mellitus type 2, hyperlipidemia presenting with 3 to 4-day history of worsening abdominal pain and nausea.  The patient was initially admitted to the hospital from 04/26/2021 to 04/28/2021 with perforated appendicitis.  The patient was treated nonoperatively with intravenous Zosyn.  He was discharged home in stable condition with Augmentin.  He developed worsening abdominal pain, and a CT of the abdomen and pelvis on 05/15/2021 revealed interval development of multiple fluid collections concerning for abscesses.  A right transgluteal pelvic drain was placed by IR on 05/20/2021.  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  During this whole time, the patient continue to follow-up with Dr. Constance Haw.  He last saw Dr. Constance Haw on 06/17/2021 at which time there was plan for operative intervention.  He subsequently underwent laparoscopic appendectomy on 06/26/2021, and he was discharged home in stable condition.  The patient states that he has been doing fine up until the last 3 to 4 days when he developed the above symptoms.  As result he presented for further evaluation and treatment. ?In the ED, the patient was afebrile and hemodynamically stable with oxygen saturation 97% on room air.  WBC 14.9, hemoglobin 9.0, platelets 239,000.  BMP showed sodium of 3.6, bicarb 21, serum creatinine 1.87.  CT of the abdomen and pelvis showed a large fluid collection/abscess in the lower abdomen abutting the cecum and appendectomy suture line.  The patient was started on IV Zosyn, IV fluids, and opioids.  General surgery was consulted to assist with management.  ?   ? ? ? ?Assessment and Plan: ?* Sepsis due to undetermined organism [facility]) ?Presented with tachycardia,  leukocytosis and intra-abdominal fluid collections ?Secondary to intra-abdominal abscess ?Lactic acid 1.0>>1.0 ?Continue IV Zosyn ?Continue IV fluids ? ?Intra-abdominal abscess ([place]) ?07/04/21 CT abd--as discussed above ?General surgery consult appreciated>>IR drain placement on 5/8 ?Continue IV Zosyn ?5/8>>IR drain placed 580 cc dark blood malodorous fluid c/w infected hematoma ?Continue IV fluids ?Follow cultures from aspiration of infected hematoma ? ?Chronic kidney disease, stage 3b ([place]) ?Baseline creatinine 1.4-1.7 ?Monitor;  Am bmp ? ?Mixed hyperlipidemia ?Resume statin once able to tolerate p.o. ? ?Essential hypertension ?Holding nifedipine, lisinopril  ?-IV metoprolol for now>>po metoprolol ? ?Type 2 diabetes mellitus with hyperglycemia (HCC) ?04/26/2021 hemoglobin A1c 7.6 ?Holding metformin and Jardiance ?NovoLog sliding scale ? ? ?Family Communication:   spouse updated at bedside 5/7 ?  ?Consultants:  general surgery, IR ?  ?Code Status:  FULL  ?  ?DVT Prophylaxis:  SCDs ?  ?  ?Procedures: ?As Listed in Progress Note Above ?  ?Antibiotics: ?Zosyn 5/5>> ?  ? ? ? ? ? ? ? ?Subjective: ?Patient states abd pain is better after aspiration.  Patient denies fevers, chills, headache, chest pain, dyspnea, nausea, vomiting, diarrhea, abdominal pain ?Objective: ?Vitals:  ? 07/07/21 1125 07/07/21 1130 07/07/21 1241 07/07/21 1417  ?BP: 114/66 120/71 (!) 156/70 119/66  ?Pulse: (!) 108 (!) 111 (!) 107 (!) 105  ?Resp: (!) 22 (!) 22 20   ?Temp:   98.2 ?F (36.8 ?C) 98.3 ?F (36.8 ?C)  ?TempSrc:   Oral Oral  ?SpO2: 100% 99% 100% 98%  ?Weight:      ?Height:      ? ? ?Intake/Output Summary (Last 24  hours) at 07/07/2021 1556 ?Last data filed at 07/07/2021 1300 ?Gross per 24 hour  ?Intake 1365 ml  ?Output --  ?Net 1365 ml  ? ?Weight change:  ?Exam: ? ?General:  Pt is alert, follows commands appropriately, not in acute distress ?HEENT: No icterus, No thrush, No neck mass, [place]/AT ?Cardiovascular: RRR, S1/S2, no rubs, no  gallops ?Respiratory: fine bibasilar rales. No wheeze ?Abdomen: Soft/+BS, mild RLQ tender, non distended, no guarding ?Extremities: No edema, No lymphangitis, No petechiae, No rashes, no synovitis ? ? ?Data Reviewed: ?I have personally reviewed following labs and imaging studies ?Basic Metabolic Panel: ?Recent Labs  ?Lab 07/04/21 ?2057 07/05/21 ?0100 07/06/21 ?0423 07/07/21 ?3532  ?NA 138 138 143 145  ?K 3.6 3.8 3.7 3.3*  ?CL 105 107 113* 116*  ?CO2 21* 20* 17* 17*  ?GLUCOSE 224* 177* 166* 162*  ?BUN 32* 31* 24* 22  ?CREATININE 1.87* 1.63* 1.44* 1.68*  ?CALCIUM 8.9 8.4* 8.4* 8.3*  ?MG  --  2.2 2.2 2.1  ?PHOS  --  4.2  --   --   ? ?Liver Function Tests: ?Recent Labs  ?Lab 07/04/21 ?2057 07/05/21 ?0100  ?AST 14* 12*  ?ALT 9 9  ?ALKPHOS 58 53  ?BILITOT 2.2* 1.8*  ?PROT 7.3 6.8  ?ALBUMIN 3.2* 3.0*  ? ?No results for input(s): LIPASE, AMYLASE in the last 168 hours. ?No results for input(s): AMMONIA in the last 168 hours. ?Coagulation Profile: ?Recent Labs  ?Lab 07/04/21 ?2057  ?INR 1.1  ? ?CBC: ?Recent Labs  ?Lab 07/04/21 ?2057 07/05/21 ?0100 07/06/21 ?0423 07/07/21 ?9924  ?WBC 14.9* 14.4* 15.7* 15.5*  ?NEUTROABS 12.2*  --   --   --   ?HGB 9.0* 9.6* 9.4* 8.7*  ?HCT 28.7* 30.2* 30.6* 27.1*  ?MCV 91.7 92.4 92.2 92.2  ?PLT 354 339 408* 382  ? ?Cardiac Enzymes: ?No results for input(s): CKTOTAL, CKMB, CKMBINDEX, TROPONINI in the last 168 hours. ?BNP: ?Invalid input(s): POCBNP ?CBG: ?Recent Labs  ?Lab 07/06/21 ?2034 07/07/21 ?0005 07/07/21 ?0427 07/07/21 ?0710 07/07/21 ?1249  ?GLUCAP 153* 158* 160* 118* 151*  ? ?HbA1C: ?No results for input(s): HGBA1C in the last 72 hours. ?Urine analysis: ?   ?Component Value Date/Time  ? [place] YELLOW 10/14/2008 0117  ? APPEARANCEUR CLEAR 10/14/2008 0117  ? LABSPEC >1.030 (H) 10/14/2008 0117  ? PHURINE 5.5 10/14/2008 0117  ? GLUCOSEU >1000 (A) 10/14/2008 0117  ? HGBUR MODERATE (A) 10/14/2008 0117  ? [place] NEGATIVE 10/14/2008 0117  ? [place] NEGATIVE 10/14/2008 0117  ? PROTEINUR  30 (A) 10/14/2008 0117  ? UROBILINOGEN 0.2 10/14/2008 0117  ? NITRITE NEGATIVE 10/14/2008 0117  ? LEUKOCYTESUR NEGATIVE 10/14/2008 0117  ? ?Sepsis Labs: ?'@LABRCNTIP'$ (procalcitonin:4,lacticidven:4) ?)No results found for this or any previous visit (from the past 240 hour(s)).  ? ?Scheduled Meds: ? insulin aspart  0-9 Units Subcutaneous Q4H  ? metoprolol tartrate  2.5 mg Intravenous Q6H  ? metoprolol tartrate  25 mg Oral BID  ? sodium chloride flush  5 mL Intracatheter Q8H  ? ?Continuous Infusions: ? sodium chloride 75 mL/hr at 07/07/21 0214  ? piperacillin-tazobactam (ZOSYN)  IV 3.375 g (07/07/21 1356)  ? ? ?Procedures/Studies: ?CT ABDOMEN PELVIS W CONTRAST ? ?Result Date: 07/04/2021 ?CLINICAL DATA:  Right lower quadrant abdominal pain. History of recent perforated appendicitis. EXAM: CT ABDOMEN AND PELVIS WITH CONTRAST TECHNIQUE: Multidetector CT imaging of the abdomen and pelvis was performed using the standard protocol following bolus administration of intravenous contrast. RADIATION DOSE REDUCTION: This exam was performed according to the departmental dose-optimization program  which includes automated exposure control, adjustment of the mA and/or kV according to patient size and/or use of iterative reconstruction technique. CONTRAST:  83m OMNIPAQUE IOHEXOL 300 MG/ML  SOLN COMPARISON:  Multiple CT of the abdomen pelvis dating back to 04/26/2021. FINDINGS: Lower chest: Emphysematous changes of the visualized lung bases. No intra-abdominal free air. There is a multilobulated fluid collection containing several small pockets of air in the lower abdomen abutting the cecum and appendectomy suture. The fluid collection measures approximately 12 x 7 cm in greatest axial dimensions and 10 cm in craniocaudal length. There is somewhat organized wall surrounding this collection which extends into the right hemipelvis. Findings most consistent with developing abscess. This may be secondary to sutural dehiscence and leak.  Surgical consult is advised. Hepatobiliary: Small cyst in the right lobe of the liver. No intrahepatic biliary ductal dilatation. The gallbladder is unremarkable. Pancreas: Unremarkable. No pancreatic ductal dilatation or surrou

## 2021-07-07 NOTE — Progress Notes (Signed)
[facility] ? ?Patient at IR and will plan to see tomorrow.  ? 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 ? ?Will ultimately need a right hemicolectomy. Will need to let this infection and inflammatory process resolve before attempting this resection. Looking at 4-6 weeks.  ? ?Curlene Labrum, MD ?[facility] ?[place], [place] [phone] ?[phone] (office) ? ?

## 2021-07-07 NOTE — Consult Note (Signed)
? ?Chief Complaint: ?Patient was seen in consultation today for intra-abdominal fluid collection ? ?Referring Physician(s): ?Aviva Signs, MD ? ?Supervising Physician: Daryll Brod ? ?Patient Status: [facility] ? ?History of Present Illness: ?Blake Rodriguez is a 71 y.o. male with past medical history of HTN, DM 2, HLD, recemt treated for perforated appendicitis ultimately requiring right TG drain placement by Dr. Laurence Ferrari 05/20/21.  Ultimately the drain was removed 06/11/21 after resolution of his abscess and he underwent lap appendectomy 06/26/21.  He now returned to the ED with elevated WBC, abdominal pain.   ? ?CT Abdomen Pelvis: ?1. Large fluid collection/abscess in the lower abdomen abutting the ?cecum and appendectomy suture. Sutural dehiscence and leak is not ?excluded. Surgical consult is advised. ?2. No bowel obstruction. ?3. Aortic Atherosclerosis (ICD10-I70.0). ? ?IR consulted for aspiration and drainage.  Case reviewed by Dr. Annamaria Boots who approves patient for the procedure.  ? ?[name] is transferred to [facility] for the procedure today from [facility].  He states he remembers the process of drain placement from last time and is hopeful for an anterior drain this time.  His daughter assisted with drain management at home. He denies concerns.  ? ?Past Medical History:  ?Diagnosis Date  ? Diabetes mellitus without complication ([place])   ? Hypertension   ? Lung collapse   ? ? ?Past Surgical History:  ?Procedure Laterality Date  ? IR RADIOLOGIST EVAL & MGMT  05/28/2021  ? IR RADIOLOGIST EVAL & MGMT  06/11/2021  ? LAPAROSCOPIC APPENDECTOMY N/A 06/26/2021  ? Procedure: APPENDECTOMY LAPAROSCOPIC ;  Surgeon: [name], MD;  Location: [facility];  Service: General;  Laterality: N/A;  ? LUNG BIOPSY  06/10/1983  ? SHOULDER ARTHROCENTESIS Left   ? ? ?Allergies: ?Patient has no known allergies. ? ?Medications: ?Prior to Admission medications   ?Medication Sig Start Date End Date Taking? Authorizing Provider   ?acetaminophen (TYLENOL) 325 MG tablet Take 2 tablets (650 mg total) by mouth every 6 (six) hours as needed for mild pain, fever or headache. 04/28/21  Yes Emokpae, Courage, MD  ?acetaminophen (TYLENOL) 500 MG tablet Take 1,000 mg by mouth every 6 (six) hours as needed for moderate pain.   Yes [provider]  ?Alogliptin Benzoate 12.5 MG TABS Take 12.5 mg by mouth daily.   Yes [provider]  ?aspirin EC 81 MG tablet Take 1 tablet (81 mg total) by mouth daily with breakfast. 04/28/21  Yes Emokpae, Courage, MD  ?atorvastatin (LIPITOR) 80 MG tablet Take 80 mg by mouth daily. 08/19/17  Yes [provider]  ?Cholecalciferol (VITAMIN D3 PO) Take 2 capsules by mouth daily.   Yes [provider]  ?empagliflozin (JARDIANCE) 25 MG TABS tablet Take 25 mg by mouth daily. 10/15/20  Yes [provider]  ?escitalopram (LEXAPRO) 10 MG tablet Take 10 mg by mouth daily.   Yes [provider]  ?ezetimibe (ZETIA) 10 MG tablet Take 10 mg by mouth daily.   Yes [provider]  ?lisinopril (PRINIVIL,ZESTRIL) 40 MG tablet Take 40 mg by mouth daily. 08/03/17  Yes [provider]  ?metFORMIN (GLUCOPHAGE-XR) 500 MG 24 hr tablet Take 500 mg by mouth 2 (two) times daily. 08/23/17  Yes [provider]  ?NIFEdipine (PROCARDIA XL/NIFEDICAL-XL) 90 MG 24 hr tablet Take 90 mg by mouth daily.   Yes [provider]  ?ondansetron (ZOFRAN) 4 MG tablet Take 1 tablet (4 mg total) by mouth every 8 (eight) hours as needed. 06/26/21 06/26/22 Yes Curlene Labrum  C, MD  ?oxyCODONE (ROXICODONE) 5 MG immediate release tablet Take 1 tablet (5 mg total) by mouth every 4 (four) hours as needed for breakthrough pain or severe pain. 06/26/21 06/26/22 Yes [name], MD  ?sodium chloride flush (NS) 0.9 % SOLN Place 10 mLs into feeding tube 2 (two) times daily. Flush catheter with 5-10 mLs daily. ?Patient not taking: Reported on 07/05/2021 05/21/21   Docia Barrier, PA   ?tiZANidine (ZANAFLEX) 4 MG tablet Take 1 tablet (4 mg total) by mouth at bedtime. ?Patient not taking: Reported on 07/05/2021 05/04/21   Jaynee Eagles, PA-C  ?  ? ?History reviewed. No pertinent family history. ? ?Social History  ? ?Socioeconomic History  ? Marital status: Married  ?  Spouse name: Not on file  ? Number of children: Not on file  ? Years of education: Not on file  ? Highest education level: Not on file  ?Occupational History  ? Not on file  ?Tobacco Use  ? Smoking status: Former  ?  Packs/day: 0.50  ?  Years: 30.00  ?  Pack years: 15.00  ?  Types: Cigarettes  ?  Quit date: 06/04/1993  ?  Years since quitting: 28.1  ? Smokeless tobacco: Never  ?Vaping Use  ? Vaping Use: Never used  ?Substance and Sexual Activity  ? Alcohol use: Yes  ?  Comment: occassional  ? Drug use: Never  ? Sexual activity: Not on file  ?Other Topics Concern  ? Not on file  ?Social History Narrative  ? Not on file  ? ?Social Determinants of Health  ? ?Financial Resource Strain: Not on file  ?Food Insecurity: Not on file  ?Transportation Needs: Not on file  ?Physical Activity: Not on file  ?Stress: Not on file  ?Social Connections: Not on file  ? ? ? ?Review of Systems: A 12 point ROS discussed and pertinent positives are indicated in the HPI above.  All other systems are negative. ? ?Review of Systems  ?Constitutional:  Negative for fatigue and fever.  ?Respiratory:  Negative for cough and shortness of breath.   ?Gastrointestinal:  Negative for abdominal pain.  ?Musculoskeletal:  Negative for back pain.  ?Psychiatric/Behavioral:  Negative for behavioral problems and confusion.   ? ?Vital Signs: ?BP (!) 146/71 (BP Location: Right Arm)   Pulse (!) 110   Temp 100 ?F (37.8 ?C) (Oral)   Resp 18   Ht '5\' 9"'$  (1.753 m)   Wt 156 lb (70.8 kg)   SpO2 98%   BMI 23.04 kg/m?  ? ?Physical Exam ?Vitals and nursing note reviewed.  ?Constitutional:   ?   General: He is not in acute distress. ?   Appearance: Normal appearance. He is not  ill-appearing.  ?HENT:  ?   Mouth/Throat:  ?   Mouth: Mucous membranes are moist.  ?   Pharynx: Oropharynx is clear.  ?Cardiovascular:  ?   Rate and Rhythm: Normal rate and regular rhythm.  ?Pulmonary:  ?   Effort: Pulmonary effort is normal. No respiratory distress.  ?   Breath sounds: Normal breath sounds.  ?Abdominal:  ?   General: Abdomen is flat. There is no distension.  ?   Palpations: Abdomen is soft.  ?   Tenderness: There is abdominal tenderness.  ?Skin: ?   General: Skin is warm and dry.  ?Neurological:  ?   General: No focal deficit present.  ?   Mental Status: He is alert and oriented to person, place, and time. Mental status  is at baseline.  ?Psychiatric:     ?   Mood and Affect: Mood normal.     ?   Behavior: Behavior normal.     ?   Thought Content: Thought content normal.     ?   Judgment: Judgment normal.  ? ? ? ?MD Evaluation ?Airway: WNL ?Heart: WNL ?Abdomen: WNL ?Chest/ Lungs: WNL ?ASA  Classification: 3 ?Mallampati/Airway Score: Two ? ? ?Imaging: ?CT ABDOMEN PELVIS W CONTRAST ? ?Result Date: 07/04/2021 ?CLINICAL DATA:  Right lower quadrant abdominal pain. History of recent perforated appendicitis. EXAM: CT ABDOMEN AND PELVIS WITH CONTRAST TECHNIQUE: Multidetector CT imaging of the abdomen and pelvis was performed using the standard protocol following bolus administration of intravenous contrast. RADIATION DOSE REDUCTION: This exam was performed according to the departmental dose-optimization program which includes automated exposure control, adjustment of the mA and/or kV according to patient size and/or use of iterative reconstruction technique. CONTRAST:  49m OMNIPAQUE IOHEXOL 300 MG/ML  SOLN COMPARISON:  Multiple CT of the abdomen pelvis dating back to 04/26/2021. FINDINGS: Lower chest: Emphysematous changes of the visualized lung bases. No intra-abdominal free air. There is a multilobulated fluid collection containing several small pockets of air in the lower abdomen abutting the cecum and  appendectomy suture. The fluid collection measures approximately 12 x 7 cm in greatest axial dimensions and 10 cm in craniocaudal length. There is somewhat organized wall surrounding this collection which extends into the right he

## 2021-07-07 NOTE — Discharge Summary (Signed)
?Physician Discharge Summary ?  ?Patient: Blake Rodriguez MRN: [id number] DOB: [birth year]  ?Admit date:     07/04/2021  ?Discharge date: 07/08/2021  ?Discharge Physician: Shanon Brow Erick Murin  ? ?PCP: [facility] [place]  ? ?Recommendations at discharge:  ? ?  Please follow up with primary care provider within 1-2 weeks ? Please repeat BMP and CBC in one week ? ? ? ? ?Hospital Course: ?71 year old male with a history of hypertension, diabetes mellitus type 2, hyperlipidemia presenting with 3 to 4-day history of worsening abdominal pain and nausea.  The patient was initially admitted to the hospital from 04/26/2021 to 04/28/2021 with perforated appendicitis.  The patient was treated nonoperatively with intravenous Zosyn.  He was discharged home in stable condition with Augmentin.  He developed worsening abdominal pain, and a CT of the abdomen and pelvis on 05/15/2021 revealed interval development of multiple fluid collections concerning for abscesses.  A right transgluteal pelvic drain was placed by IR on 05/20/2021.  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  During this whole time, the patient continue to follow-up with Dr. Constance Haw.  He last saw Dr. Constance Haw on 06/17/2021 at which time there was plan for operative intervention.  He subsequently underwent laparoscopic appendectomy on 06/26/2021, and he was discharged home in stable condition.  The patient states that he has been doing fine up until the last 3 to 4 days when he developed the above symptoms.  As result he presented for further evaluation and treatment. ?In the ED, the patient was afebrile and hemodynamically stable with oxygen saturation 97% on room air.  WBC 14.9, hemoglobin 9.0, platelets 239,000.  BMP showed sodium of 3.6, bicarb 21, serum creatinine 1.87.  CT of the abdomen and pelvis showed a large fluid collection/abscess in the lower abdomen abutting the cecum and appendectomy  suture line.  The patient was started on IV Zosyn, IV fluids, and opioids.  General surgery was consulted to assist with management.  ?  ? ?Assessment and Plan: ?* Sepsis due to undetermined organism [facility]) ?Presented with tachycardia, leukocytosis and intra-abdominal fluid collections ?Secondary to intra-abdominal abscess ?Lactic acid 1.0>>1.0 ?Continued IV Zosyn during hospitalization>>d/c home with amox/clav ?Continued IV fluids ?Sepsis physiology resolved ? ?Intra-abdominal abscess ([place]) ?07/04/21 CT abd--as discussed above ?General surgery consult appreciated>>IR drain placement on 5/8 ?Continued IV Zosyn during hospitalization>>d/c home with amox/clav ?5/8>>IR drain placed 580 cc dark blood malodorous fluid c/w infected hematoma ?Continued IV fluids during hospitalization ?Follow cultures from aspiration of infected hematoma--discussed with Dr. Ollen Gross will follow and make any abx adjustment ?Family comfortable managing drain at home ?Follow up with Dr. Constance Haw in office ? ?Chronic kidney disease, stage 3b ([place]) ?Baseline creatinine 1.4-1.7 ?Serum creatinine 1.56 on day of dc ? ?Mixed hyperlipidemia ?Resume statin ? ?Essential hypertension ?Holding nifedipine, lisinopril initially due to soft BPs ?-IV metoprolol for now>>po metoprolol ?-will not restart lisinopril--BP remained controlled ? ?Type 2 diabetes mellitus with hyperglycemia (HCC) ?04/26/2021 hemoglobin A1c 7.6 ?Holding metformin and Jardiance--restart after d/c ?NovoLog sliding scale ? ? ? ? ?  ? ? ?Consultants: general surgery ?Procedures performed: IR drain placement 5/8  ?Disposition: Home ?Diet recommendation:  ?Carb modified diet ?DISCHARGE MEDICATION: ?Allergies as of 07/08/2021   ?No Known Allergies ?  ? ?  ?Medication List  ?  ? ?STOP taking these medications   ? ?lisinopril 40 MG tablet ?Commonly known as: ZESTRIL ?  ?sodium chloride flush 0.9 % Soln ?Commonly  known as: NS ?  ?tiZANidine 4 MG tablet ?Commonly known as: Zanaflex ?  ? ?   ? ?TAKE these medications   ? ?acetaminophen 500 MG tablet ?Commonly known as: TYLENOL ?Take 1,000 mg by mouth every 6 (six) hours as needed for moderate pain. ?  ?acetaminophen 325 MG tablet ?Commonly known as: TYLENOL ?Take 2 tablets (650 mg total) by mouth every 6 (six) hours as needed for mild pain, fever or headache. ?  ?Alogliptin Benzoate 12.5 MG Tabs ?Take 12.5 mg by mouth daily. ?  ?amoxicillin-clavulanate 875-125 MG tablet ?Commonly known as: AUGMENTIN ?Take 1 tablet by mouth every 12 (twelve) hours. ?  ?aspirin EC 81 MG tablet ?Take 1 tablet (81 mg total) by mouth daily with breakfast. ?  ?atorvastatin 80 MG tablet ?Commonly known as: LIPITOR ?Take 80 mg by mouth daily. ?  ?empagliflozin 25 MG Tabs tablet ?Commonly known as: JARDIANCE ?Take 25 mg by mouth daily. ?  ?escitalopram 10 MG tablet ?Commonly known as: LEXAPRO ?Take 10 mg by mouth daily. ?  ?ezetimibe 10 MG tablet ?Commonly known as: ZETIA ?Take 10 mg by mouth daily. ?  ?metFORMIN 500 MG 24 hr tablet ?Commonly known as: GLUCOPHAGE-XR ?Take 500 mg by mouth 2 (two) times daily. ?  ?metoprolol tartrate 25 MG tablet ?Commonly known as: LOPRESSOR ?Take 1 tablet (25 mg total) by mouth 2 (two) times daily. ?  ?NIFEdipine 90 MG 24 hr tablet ?Commonly known as: PROCARDIA XL/NIFEDICAL-XL ?Take 90 mg by mouth daily. ?  ?ondansetron 4 MG tablet ?Commonly known as: Zofran ?Take 1 tablet (4 mg total) by mouth every 8 (eight) hours as needed. ?  ?oxyCODONE 5 MG immediate release tablet ?Commonly known as: Roxicodone ?Take 1 tablet (5 mg total) by mouth every 4 (four) hours as needed for breakthrough pain or severe pain. ?  ?VITAMIN D3 PO ?Take 2 capsules by mouth daily. ?  ? ?  ? ? ?Discharge Exam: ?Filed Weights  ? 07/04/21 2023  ?Weight: 70.8 kg  ? ?HEENT:  [place]/AT, No thrush, no icterus ?CV:  RRR, no rub, no S3, no S4 ?Lung:  CTA, no wheeze, no rhonchi ?Abd:  soft/+BS, mild RLQ--drain in place ?Ext:  No edema, no lymphangitis, no synovitis, no  rash ? ? ?Condition at discharge: stable ? ?The results of significant diagnostics from this hospitalization (including imaging, microbiology, ancillary and laboratory) are listed below for reference.  ? ?Imaging Studies: ?CT ABDOMEN PELVIS W CONTRAST ? ?Result Date: 07/04/2021 ?CLINICAL DATA:  Right lower quadrant abdominal pain. History of recent perforated appendicitis. EXAM: CT ABDOMEN AND PELVIS WITH CONTRAST TECHNIQUE: Multidetector CT imaging of the abdomen and pelvis was performed using the standard protocol following bolus administration of intravenous contrast. RADIATION DOSE REDUCTION: This exam was performed according to the departmental dose-optimization program which includes automated exposure control, adjustment of the mA and/or kV according to patient size and/or use of iterative reconstruction technique. CONTRAST:  29m OMNIPAQUE IOHEXOL 300 MG/ML  SOLN COMPARISON:  Multiple CT of the abdomen pelvis dating back to 04/26/2021. FINDINGS: Lower chest: Emphysematous changes of the visualized lung bases. No intra-abdominal free air. There is a multilobulated fluid collection containing several small pockets of air in the lower abdomen abutting the cecum and appendectomy suture. The fluid collection measures approximately 12 x 7 cm in greatest axial dimensions and 10 cm in craniocaudal length. There is somewhat organized wall surrounding this collection which extends into the right hemipelvis. Findings most consistent with developing abscess. This may be secondary to  sutural dehiscence and leak. Surgical consult is advised. Hepatobiliary: Small cyst in the right lobe of the liver. No intrahepatic biliary ductal dilatation. The gallbladder is unremarkable. Pancreas: Unremarkable. No pancreatic ductal dilatation or surrounding inflammatory changes. Spleen: Normal in size without focal abnormality. Adrenals/Urinary Tract: The adrenal glands are unremarkable. Multiple bilateral renal cysts as seen on the prior  CTs. There is no hydronephrosis on either side. There is symmetric enhancement and excretion of contrast by both kidneys. The visualized ureters and the urinary bladder appear unremarkable. Stomach/Bowel: There is no bowel obs

## 2021-07-08 DIAGNOSIS — K651 Peritoneal abscess: Secondary | ICD-10-CM | POA: Diagnosis not present

## 2021-07-08 DIAGNOSIS — N1832 Chronic kidney disease, stage 3b: Secondary | ICD-10-CM | POA: Diagnosis not present

## 2021-07-08 DIAGNOSIS — A419 Sepsis, unspecified organism: Secondary | ICD-10-CM | POA: Diagnosis not present

## 2021-07-08 LAB — CBC
HCT: 27.4 % — ABNORMAL LOW (ref 39.0–52.0)
Hemoglobin: 8.4 g/dL — ABNORMAL LOW (ref 13.0–17.0)
MCH: 28.6 pg (ref 26.0–34.0)
MCHC: 30.7 g/dL (ref 30.0–36.0)
MCV: 93.2 fL (ref 80.0–100.0)
Platelets: 383 10*3/uL (ref 150–400)
RBC: 2.94 MIL/uL — ABNORMAL LOW (ref 4.22–5.81)
RDW: 17.5 % — ABNORMAL HIGH (ref 11.5–15.5)
WBC: 14.3 10*3/uL — ABNORMAL HIGH (ref 4.0–10.5)
nRBC: 0 % (ref 0.0–0.2)

## 2021-07-08 LAB — BASIC METABOLIC PANEL
Anion gap: 11 (ref 5–15)
BUN: 23 mg/dL (ref 8–23)
CO2: 19 mmol/L — ABNORMAL LOW (ref 22–32)
Calcium: 8 mg/dL — ABNORMAL LOW (ref 8.9–10.3)
Chloride: 113 mmol/L — ABNORMAL HIGH (ref 98–111)
Creatinine, Ser: 1.56 mg/dL — ABNORMAL HIGH (ref 0.61–1.24)
GFR, Estimated: 47 mL/min — ABNORMAL LOW (ref >60)
Glucose, Bld: 169 mg/dL — ABNORMAL HIGH (ref 70–99)
Potassium: 3.3 mmol/L — ABNORMAL LOW (ref 3.5–5.1)
Sodium: 143 mmol/L (ref 135–145)

## 2021-07-08 LAB — GLUCOSE, CAPILLARY
Glucose-Capillary: 134 mg/dL — ABNORMAL HIGH (ref 70–99)
Glucose-Capillary: 198 mg/dL — ABNORMAL HIGH (ref 70–99)

## 2021-07-08 MED ORDER — POTASSIUM CHLORIDE CRYS ER 20 MEQ PO TBCR
20.0000 meq | EXTENDED_RELEASE_TABLET | Freq: Once | ORAL | Status: AC
  Administered 2021-07-08: 20 meq via ORAL
  Filled 2021-07-08: qty 1

## 2021-07-08 MED ORDER — AMOXICILLIN-POT CLAVULANATE 875-125 MG PO TABS: 1.0000 | ORAL_TABLET | Freq: Two times a day (BID) | ORAL | 0 refills | Status: DC

## 2021-07-08 MED ORDER — METOPROLOL TARTRATE 25 MG PO TABS: 25.0000 mg | ORAL_TABLET | Freq: Two times a day (BID) | ORAL | 1 refills | Status: AC

## 2021-07-08 MED ORDER — OXYCODONE HCL 5 MG PO TABS: 5.0000 mg | ORAL_TABLET | ORAL | 0 refills | Status: DC | PRN

## 2021-07-08 MED ORDER — AMOXICILLIN-POT CLAVULANATE 875-125 MG PO TABS
1.0000 | ORAL_TABLET | Freq: Two times a day (BID) | ORAL | Status: DC
  Administered 2021-07-08: 1 via ORAL
  Filled 2021-07-08: qty 1

## 2021-07-08 NOTE — Discharge Instructions (Signed)
Flush JP drain using 5 cc of sterile saline every 8 hours. ?

## 2021-07-08 NOTE — TOC Progression Note (Signed)
Transition of Care (TOC) - Progression Note  ? ? ?Patient Details  ?Name: Blake Rodriguez ?MRN: [id number] ?Date of Birth: 03-20-1950 ? ?Transition of Care (TOC) CM/SW Contact  ?Salome Arnt, LCSW ?Phone Number: ?07/08/2021, 8:01 AM ? ?Clinical Narrative:  [place] notification completed. Notification ID: [id number].  ? ? ? ?  ?  ? ?Expected Discharge Plan and Services ?  ?  ?  ?  ?  ?                ?  ?  ?  ?  ?  ?  ?  ?  ?  ?  ? ? ?Social Determinants of Health (SDOH) Interventions ?  ? ?Readmission Risk Interventions ?   ? View : No data to display.  ?  ?  ?  ? ? ?

## 2021-07-08 NOTE — Progress Notes (Signed)
Discharge instructions provided to patient at this time. Verbalizes understanding of the information that was presented. Family to pick patient up for transition home.  ?

## 2021-07-08 NOTE — Progress Notes (Signed)
?   07/07/21 2040  ?Assess: MEWS Score  ?Temp 98.9 ?F (37.2 ?C)  ?BP (!) 144/70  ?Pulse Rate (!) 111  ?Resp 17  ?SpO2 98 %  ?O2 Device Room Air  ?Assess: MEWS Score  ?MEWS Temp 0  ?MEWS Systolic 0  ?MEWS Pulse 2  ?MEWS RR 0  ?MEWS LOC 0  ?MEWS Score 2  ?MEWS Score Color Yellow  ?Assess: if the MEWS score is Yellow or Red  ?Were vital signs taken at a resting state? Yes  ?Focused Assessment No change from prior assessment  ?Early Detection of Sepsis Score *See Row Information* Medium  ?MEWS guidelines implemented *See Row Information* Yes  ?Take Vital Signs  ?Increase Vital Sign Frequency  Yellow: Q 2hr X 2 then Q 4hr X 2, if remains yellow, continue Q 4hrs  ?Escalate  ?MEWS: Escalate Yellow: discuss with charge nurse/RN and consider discussing with provider and RRT  ?Notify: Charge Nurse/RN  ?Name of Charge Nurse/RN Notified Deeann Dowse, RN  ?Date Charge Nurse/RN Notified 07/07/21  ?Time Charge Nurse/RN Notified 2040  ? ? ?

## 2021-07-08 NOTE — Progress Notes (Signed)
[name] Progress Note ? ?   ?Subjective: ?Patient doing well, states he would like to go home. States he has some abdominal pain that is well controlled on current pain regimen. He is stooling and voiding well with no issues. States that he had some nausea yesterday but not significant to cause inability to eat. He is tolerating his diet well and has good appetite. No pain to drain sites, drains patent.  ? ?Objective: ?Vital signs in last 24 hours: ?Temp:  [98.1 ?F (36.7 ?C)-99 ?F (37.2 ?C)] 98.1 ?F (36.7 ?C) (05/09 0440) ?Pulse Rate:  [96-113] 96 (05/09 0440) ?Resp:  [17-23] 18 (05/09 0440) ?BP: (109-163)/(59-84) 141/84 (05/09 0440) ?SpO2:  [98 %-100 %] 98 % (05/09 0440) ?Last BM Date : 07/08/21 ? ?Intake/Output from previous day: ?05/08 0701 - 05/09 0700 ?In: 3223 [P.O.:480; I.V.:2505; IV Piggyback:238] ?Out: 400 [Urine:300; Drains:100] ?Intake/Output this shift: ?No intake/output data recorded. ? ?Physical Exam ?Constitutional: Elderly male laying comfortably in bed, conversant, NAD ?HEENT: Atraumatic, normocephalic, moist mucus membranes ?Pulm: Normal work of breathing on room air ?Abd: soft, mildly tender to drain site in LL abdomen, nondistended, drain in place in LL abdomen with dark bloody drainage, no bowel contents or pus seen ?Extremities: moving all extremities spontaneously, no tenderness or swelling to bilateral lower extremities ?Neuro: alert and oriented x3, answering questions appropriately ?Psych: normal mood and affect ? ?Lab Results:  ?Recent Labs  ?  07/07/21 ?4970 07/08/21 ?2637  ?WBC 15.5* 14.3*  ?HGB 8.7* 8.4*  ?HCT 27.1* 27.4*  ?PLT 382 383  ? ?BMET ?Recent Labs  ?  07/07/21 ?8588 07/08/21 ?5027  ?NA 145 143  ?K 3.3* 3.3*  ?CL 116* 113*  ?CO2 17* 19*  ?GLUCOSE 162* 169*  ?BUN 22 23  ?CREATININE 1.68* 1.56*  ?CALCIUM 8.3* 8.0*  ? ?PT/INR ?No results for input(s): LABPROT, INR in the last 72 hours. ? ?Studies/Results: ?CT IMAGE GUIDED DRAINAGE BY PERCUTANEOUS  CATHETER ? ?Result Date: 07/07/2021 ?INDICATION: Postop lower abdominopelvic fluid collection, concern for abscess EXAM: CT GUIDED LOWER ABDOMINOPELVIC FLUID COLLECTION DRAINAGE MEDICATIONS: The patient is currently admitted to the hospital and receiving intravenous antibiotics. The antibiotics were administered within an appropriate time frame prior to the initiation of the procedure. ANESTHESIA/SEDATION: Moderate (conscious) sedation was employed during this procedure. A total of Versed 2.0 mg and Fentanyl 50 mcg was administered intravenously by the radiology nurse. Total intra-service moderate Sedation Time: 16 minutes. The patient's level of consciousness and vital signs were monitored continuously by radiology nursing throughout the procedure under my direct supervision. COMPLICATIONS: None immediate. PROCEDURE: Informed written consent was obtained from the patient after a thorough discussion of the procedural risks, benefits and alternatives. All questions were addressed. Maximal Sterile Barrier Technique was utilized including caps, mask, sterile gowns, sterile gloves, sterile drape, hand hygiene and skin antiseptic. A timeout was performed prior to the initiation of the procedure. Previous imaging reviewed. The anterior abdominopelvic complex fluid collection was localized and marked for a left anterior approach. Under sterile conditions and local anesthesia, an 18 gauge 10 cm access was advanced from a left anterior approach into the complex fluid collection. Syringe aspiration yielded malodorous dark bloody fluid compatible with infected hematoma. Guidewire inserted followed by tract dilatation to insert a 12 [place] drain. Drain catheter position confirmed with CT. Syringe aspiration yielded 580 cc of dark bloody fluid. Catheter secured with a Prolene suture and connected to external suction bulb. Sterile dressing applied. No immediate complication. Patient tolerated the procedure well. IMPRESSION:  Successful CT-guided anterior lower abdominal abscess drain placement as above. Sample sent for culture. Electronically Signed   By: Jerilynn Mages.  Shick M.D.   On: 07/07/2021 12:05   ? ?Anti-infectives: ?Anti-infectives (From admission, onward)  ? ? Start     Dose/Rate Route Frequency Ordered Stop  ? 07/05/21 0715  piperacillin-tazobactam (ZOSYN) IVPB 3.375 g       ? 3.375 g ?12.5 mL/hr over 240 Minutes Intravenous Every 8 hours 07/05/21 0623    ? 07/04/21 2345  piperacillin-tazobactam (ZOSYN) IVPB 3.375 g       ? 3.375 g ?100 mL/hr over 30 Minutes Intravenous  Once 07/04/21 2333 07/05/21 0045  ? ?  ? ? ?Assessment/Plan: ?Mr. 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 He continues on antibiotics and is doing well, today he remains afebrile with improved leukocytosis.  Drain in place in LL abdomen with dark bloody drainage, no bowel contents or pus seen. He is feeling well and would like to go home. ? ?Neuro ?-on pain regimen with good pain control, continue prn ? ?GI ?-drain in place in LL abdomen with dark bloody drainage, no bowel contents or pus seen. Continue with drain. ? ?GU ?-k continues low at 3.3, repleting today with Kcl 36mq tablet ? ?Heme ?-Hgb remains stable. ? ?ID ?-Leukocytosis improving, 14.3 today. ?-patient on Day 2 of IV Zosyn can likely transition to oral Augmentin today if he will be discharging home.  ?-intraabdominal abscess cultures with no growth to date, will CTM and adjust antibiotics as necessary ? ?Prophylaxis ?-SCDs ? ?Disposition  ?-Patient remains afebrile with improved leukocytosis. He is hemodynamically stable and endorses feeling well today. He can likely discharge home with oral antibiotics and with LL abdominal drain in place. He will be [facility] for continued monitoring. He will ultimately require right hemicolectomy in the near future post resolution of the current  infection and inflammatory process. ? ? LOS: 3 days  ? ? ?Riah Kehoe J Rodriguez-Teodoro ?07/08/2021  ?

## 2021-07-10 ENCOUNTER — Ambulatory Visit: Payer: No Typology Code available for payment source | Admitting: General Surgery

## 2021-07-10 ENCOUNTER — Encounter: Payer: No Typology Code available for payment source | Admitting: General Surgery

## 2021-07-10 LAB — AEROBIC/ANAEROBIC CULTURE W GRAM STAIN (SURGICAL/DEEP WOUND): Gram Stain: NONE SEEN

## 2021-07-16 ENCOUNTER — Encounter: Payer: Self-pay | Admitting: General Surgery

## 2021-07-16 ENCOUNTER — Ambulatory Visit (INDEPENDENT_AMBULATORY_CARE_PROVIDER_SITE_OTHER): Payer: No Typology Code available for payment source | Admitting: General Surgery

## 2021-07-16 VITALS — BP 118/72 | HR 83 | Temp 97.7°F | Resp 18 | Ht 69.0 in | Wt 148.8 lb

## 2021-07-16 DIAGNOSIS — C181 Malignant neoplasm of appendix: Secondary | ICD-10-CM

## 2021-07-16 NOTE — Patient Instructions (Addendum)
Keep drain care flushing the 5cc of saline every 8 hours as instructed previously. ?Call with issues.  ?Will send the [place] the letter.  ?

## 2021-07-16 NOTE — Progress Notes (Signed)
[name]  To whom it may concern,  [name], DOB March 27, 1950 who I have been treating for perforated appendicitis needs to be referred to Hematology/ Oncology, specifically Dr. 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 The patient had suffered from perforated appendicitis on April 26, 2021 and had a drain placed a few days later. He then had his interval laparoscopic appendectomy on June 26, 2021 and the pathology returned with the results above.  Since the appendectomy, he was admitted again with a recollection of an abscess and this has since been drained on Jul 06, 2021 and antibiotics were given. Due to his cancer diagnosis, he will need a formal right hemicolectomy to ensure margins are clear and to assess the lymph nodes in that region.  He will potentially need a port-a-catheter placed and chemotherapy with Oncology due to the perforation and cancer diagnosis.  I believe it is in the patient's best interest to have his care closer to home (i.e. [place], [place]) given the diagnosis and continued care and multiple trips to the hospital.  I do not think that driving to [place], [place] (67 miles) for these services is appropriate for the patient who has a drain in place and will potentially need weekly visits in the upcoming weeks.  Please let me know if you need any additional information. Sincerely, Lanell Matar. Constance Haw, MD FACS

## 2021-07-16 NOTE — Progress Notes (Signed)
[facility] Note   HPI:  71 y.o. Male known to me with a history of perforated acute appendicitis 04/26/2021 initially managed with antibiotics and no abscess noted. He then developed an abscess requiring drainage 05/20/21. 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  He underwent an interval appendectomy 06/26/21 and did well. Unfortunately his pathology came back with globet cell adenocarcinoma low grade.  After I notified him of this he then came back to the hospital feeling poorly and was again found to have a recollection of an abscess and had IR drainage 5/8. He currently still has this drain in place and it is draining about 20 cc of red purulent drainage. He is flushing it 5cc every 8 hours. He denies any pain or issues otherwise.  Review of Systems:  No fevers Tolerating diet Having Bms No pain All other review of systems: otherwise negative   Vital Signs:  BP 118/72   Pulse 83   Temp 97.7 F (36.5 C) (Oral)   Resp 18   Ht '5\' 9"'$  (1.753 m)   Wt 148 lb 12.8 oz (67.5 kg)   SpO2 96%   BMI 21.97 kg/m    Physical Exam:  Physical Exam Vitals reviewed.  Cardiovascular:     Rate and Rhythm: Normal rate.  Pulmonary:     Effort: Pulmonary effort is normal.  Abdominal:     General: There is no distension.     Palpations: Abdomen is soft.     Tenderness: There is no abdominal tenderness.     Comments: IR drain with red purulent drainage   Neurological:     Mental Status: He is alert.      Assessment:  71 y.o. yo Male with history of perforated appendicitis sp non operative management, interval appendectomy and subsequent discovery of goblet cell adenocarcinoma. He will need a formal right hemicolectomy and I will do this after this infection process and inflammation has resolved some. I think trying to operate before the 6 week period  will increase the risk of anastomotic leak in the resection. I want to get his infection cleared and plan for an open right hemicolectomy. He is also being referred to Dr. Delton Coombes at [facility] in [place], [place], Mr. Horn[place] hometown. Given the goblet cell adenocarcinoma and perforation, the patient may require chemotherapy.   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 He wants to continue his care close to home as he currently has a drain in place that will stay in until surgery is over and will potentially requiring a port a catheter to be placed and chemotherapy pending Dr. Tomie China thoughts and pathology.   I personally think it is in the patient's best interest to stay closer to home given the limitations of mobility with the drain, the weakness and deconditioning occurring after surgery and infections, and the potential need for further treatments.   Plan:  Keep drain care flushing the 5cc of saline every 8 hours as instructed previously. Call with issues.  [facility].  See Dr. Delton Coombes in a few weeks. I will see you after and make a plan for the timing for surgery.   All of the above recommendations were discussed with the patient and patient's family, and all of patient's and family's questions were answered to their expressed satisfaction.   Future  Appointments  Date Time Provider [place]  07/29/2021  1:30 PM Derek Jack, MD [facility] None  07/31/2021  1:15 PM [name], MD [facility] None    Curlene Labrum, MD [facility] [address] [place], [place] [address] [phone] (office)

## 2021-07-29 ENCOUNTER — Inpatient Hospital Stay (HOSPITAL_COMMUNITY): Payer: Medicare (Managed Care) | Attending: Hematology | Admitting: Hematology

## 2021-07-29 ENCOUNTER — Inpatient Hospital Stay (HOSPITAL_COMMUNITY): Payer: Medicare (Managed Care)

## 2021-07-29 VITALS — BP 131/72 | HR 65 | Temp 97.4°F | Resp 16 | Ht 69.0 in | Wt 150.6 lb

## 2021-07-29 DIAGNOSIS — C181 Malignant neoplasm of appendix: Secondary | ICD-10-CM | POA: Diagnosis not present

## 2021-07-29 DIAGNOSIS — Z801 Family history of malignant neoplasm of trachea, bronchus and lung: Secondary | ICD-10-CM | POA: Insufficient documentation

## 2021-07-29 DIAGNOSIS — D649 Anemia, unspecified: Secondary | ICD-10-CM

## 2021-07-29 DIAGNOSIS — Z9049 Acquired absence of other specified parts of digestive tract: Secondary | ICD-10-CM | POA: Insufficient documentation

## 2021-07-29 DIAGNOSIS — Z87891 Personal history of nicotine dependence: Secondary | ICD-10-CM | POA: Diagnosis not present

## 2021-07-29 LAB — IRON AND TIBC
Iron: 56 ug/dL (ref 45–182)
Saturation Ratios: 19 % (ref 17.9–39.5)
TIBC: 293 ug/dL (ref 250–450)
UIBC: 237 ug/dL

## 2021-07-29 LAB — CBC WITH DIFFERENTIAL/PLATELET
Abs Immature Granulocytes: 0.02 10*3/uL (ref 0.00–0.07)
Basophils Absolute: 0.1 10*3/uL (ref 0.0–0.1)
Basophils Relative: 1 %
Eosinophils Absolute: 0.1 10*3/uL (ref 0.0–0.5)
Eosinophils Relative: 1 %
HCT: 36.2 % — ABNORMAL LOW (ref 39.0–52.0)
Hemoglobin: 11 g/dL — ABNORMAL LOW (ref 13.0–17.0)
Immature Granulocytes: 0 %
Lymphocytes Relative: 30 %
Lymphs Abs: 2.3 10*3/uL (ref 0.7–4.0)
MCH: 28.9 pg (ref 26.0–34.0)
MCHC: 30.4 g/dL (ref 30.0–36.0)
MCV: 95 fL (ref 80.0–100.0)
Monocytes Absolute: 0.6 10*3/uL (ref 0.1–1.0)
Monocytes Relative: 8 %
Neutro Abs: 4.6 10*3/uL (ref 1.7–7.7)
Neutrophils Relative %: 60 %
Platelets: 296 10*3/uL (ref 150–400)
RBC: 3.81 MIL/uL — ABNORMAL LOW (ref 4.22–5.81)
RDW: 17.8 % — ABNORMAL HIGH (ref 11.5–15.5)
WBC: 7.6 10*3/uL (ref 4.0–10.5)
nRBC: 0 % (ref 0.0–0.2)

## 2021-07-29 LAB — COMPREHENSIVE METABOLIC PANEL
ALT: 12 U/L (ref 0–44)
AST: 21 U/L (ref 15–41)
Albumin: 3.6 g/dL (ref 3.5–5.0)
Alkaline Phosphatase: 71 U/L (ref 38–126)
Anion gap: 6 (ref 5–15)
BUN: 10 mg/dL (ref 8–23)
CO2: 26 mmol/L (ref 22–32)
Calcium: 9 mg/dL (ref 8.9–10.3)
Chloride: 109 mmol/L (ref 98–111)
Creatinine, Ser: 1.24 mg/dL (ref 0.61–1.24)
GFR, Estimated: >60 mL/min (ref >60)
Glucose, Bld: 129 mg/dL — ABNORMAL HIGH (ref 70–99)
Potassium: 3.8 mmol/L (ref 3.5–5.1)
Sodium: 141 mmol/L (ref 135–145)
Total Bilirubin: 0.3 mg/dL (ref 0.3–1.2)
Total Protein: 7.9 g/dL (ref 6.5–8.1)

## 2021-07-29 LAB — FOLATE: Folate: 15.1 ng/mL (ref >5.9)

## 2021-07-29 LAB — VITAMIN B12: Vitamin B-12: 226 pg/mL (ref 180–914)

## 2021-07-29 LAB — FERRITIN: Ferritin: 354 ng/mL — ABNORMAL HIGH (ref 24–336)

## 2021-07-29 NOTE — Patient Instructions (Addendum)
[place] at [facility] Discharge Instructions  You were seen and examined today by Dr. Delton Coombes. Dr. Delton Coombes is a medical oncologist, meaning that he specializes in the treatment of cancer diagnoses. Dr. Delton Coombes discussed your past medical history, family history of cancers, and the events that led to you being here today.  You were referred to Dr. Delton Coombes by Dr. Constance Haw due to an abnormality noted when your appendix was removed. When studied, it appears that your cancer was involved with adenocarcinoma (cancer). This is typically a rare, slow growing cancer, but there was cancer present in the site where the appendix meets the colon.  Dr. Delton Coombes has recommended follow-up with Dr. Constance Haw regarding an additional surgery. Dr. Constance Haw will remove the area of colon surrounding where the the appendix was attached as well as lymph nodes. If there is any spread to the lymph nodes, Dr. Delton Coombes will discuss further treatment.  Dr. Delton Coombes has also recommended additional labs today. Dr. Delton Coombes would also like for you to have a CT scan of your chest. This will help to confirm the exact stage of your cancer.  Follow-up as scheduled approximately 4 weeks after surgery.   Thank you for choosing [place] at [facility] to provide your oncology and hematology care.  To afford each patient quality time with our provider, please arrive at least 15 minutes before your scheduled appointment time.   If you have a lab appointment with the [facility] please come in thru the Main Entrance and check in at the main information desk.  You need to re-schedule your appointment should you arrive 10 or more minutes late.  We strive to give you quality time with our providers, and arriving late affects you and other patients whose appointments are after yours.  Also, if you no show three or more times for appointments you may be dismissed from the  clinic at the providers discretion.     Again, thank you for choosing [facility].  Our hope is that these requests will decrease the amount of time that you wait before being seen by our physicians.       _____________________________________________________________  Should you have questions after your visit to [facility], please contact our office at [phone] and follow the prompts.  Our office hours are 8:00 a.m. and 4:30 p.m. Monday - Friday.  Please note that voicemails left after 4:00 p.m. may not be returned until the following business day.  We are closed weekends and major holidays.  You do have access to a nurse 24-7, just call the main number to the clinic [phone] and do not press any options, hold on the line and a nurse will answer the phone.    For prescription refill requests, have your pharmacy contact our office and allow 72 hours.    Due to Covid, you will need to wear a mask upon entering the hospital. If you do not have a mask, a mask will be given to you at the Main Entrance upon arrival. For doctor visits, patients may have 1 support person age 69 or older with them. For treatment visits, patients can not have anyone with them due to social distancing guidelines and our immunocompromised population.

## 2021-07-29 NOTE — Progress Notes (Signed)
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 71 y.o. 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 Constance Haw.  Today he reports feeling good, and he is accompanied by his daughter. He underwent appendectomy on  06/26/21. His energy levels are at baseline. His appetite is good, and he reports normal BM. He denies weight loss prior to his appendectomy, but he reports weight loss following his appendectomy. He reports his baseline weight in about 170 lbs. He reports reports numbness in his left big toe due to a bunion, and he otherwise denies tingling or numbness in his hands and feet. He denies history of CVA and MI.   He currently lives at home with his family, and he is able to do his typical daily activities. Prior to retirement he worked at an [employer] ([employer]). He quit smoking about 25 years ago. His brother had lung cancer, and he otherwise denies family history of cancer.   MEDICAL HISTORY:  Past Medical History:  Diagnosis Date   Diabetes mellitus without complication ([facility])    Hypertension    Lung collapse     SURGICAL HISTORY: Past Surgical History:  Procedure Laterality Date   IR RADIOLOGIST EVAL & MGMT  05/28/2021   IR RADIOLOGIST EVAL & MGMT  06/11/2021   LAPAROSCOPIC APPENDECTOMY N/A 06/26/2021   Procedure: APPENDECTOMY LAPAROSCOPIC ;  Surgeon: [name], MD;  Location: [facility];  Service: General;  Laterality: N/A;   LUNG BIOPSY  06/10/1983    SHOULDER ARTHROCENTESIS Left     SOCIAL HISTORY: Social History   Socioeconomic History   Marital status: Married    Spouse name: Not on file   Number of children: Not on file   Years of education: Not on file   Highest education level: Not on file  Occupational History   Not on file  Tobacco Use   Smoking status: Former    Packs/day: 0.50    Years: 30.00    Pack years: 15.00    Types: Cigarettes    Quit date: 06/04/1993    Years since quitting: 28.1   Smokeless tobacco: Never  Vaping Use   Vaping Use: Never used  Substance and Sexual Activity   Alcohol use: Yes    Comment: occassional   Drug use: Never   Sexual activity: Not on file  Other Topics Concern   Not on file  Social History Narrative   Not on file   Social Determinants of Health   Financial Resource Strain: Not on file  Food Insecurity: Not on file  Transportation Needs: Not on file  Physical Activity: Not on file  Stress: Not on file  Social Connections: Not on file  Intimate Partner Violence: Not on file    FAMILY HISTORY: No family history on file.  ALLERGIES:  has No Known Allergies.  MEDICATIONS:  Current Outpatient Medications  Medication Sig Dispense Refill   acetaminophen (TYLENOL) 325 MG tablet Take 2 tablets (650 mg total) by mouth every 6 (six) hours as needed for mild pain, fever or headache. 12 tablet  0   Alogliptin Benzoate 12.5 MG TABS Take 12.5 mg by mouth daily.     aspirin EC 81 MG tablet Take 1 tablet (81 mg total) by mouth daily with breakfast. 30 tablet 11   atorvastatin (LIPITOR) 80 MG tablet Take 80 mg by mouth daily.     Cholecalciferol (VITAMIN D3 PO) Take 2 capsules by mouth daily.     empagliflozin (JARDIANCE) 25 MG TABS tablet Take 25 mg by mouth daily.     escitalopram (LEXAPRO) 10 MG tablet Take 10 mg by mouth daily.     ezetimibe (ZETIA) 10 MG tablet Take 10 mg by mouth daily.     metFORMIN (GLUCOPHAGE-XR) 500 MG 24 hr tablet Take 500 mg by mouth 2 (two) times  daily.     metoprolol tartrate (LOPRESSOR) 25 MG tablet Take 1 tablet (25 mg total) by mouth 2 (two) times daily. 60 tablet 1   NIFEdipine (PROCARDIA XL/NIFEDICAL-XL) 90 MG 24 hr tablet Take 90 mg by mouth daily.     oxyCODONE (ROXICODONE) 5 MG immediate release tablet Take 1 tablet (5 mg total) by mouth every 4 (four) hours as needed for breakthrough pain or severe pain. 15 tablet 0   ondansetron (ZOFRAN) 4 MG tablet Take 1 tablet (4 mg total) by mouth every 8 (eight) hours as needed. (Patient not taking: Reported on 07/29/2021) 30 tablet 1   No current facility-administered medications for this visit.    REVIEW OF SYSTEMS:   Review of Systems  Constitutional:  Negative for appetite change and fatigue.  Gastrointestinal:  Negative for constipation and diarrhea.  Neurological:  Positive for numbness (L big toe). Negative for dizziness and headaches.  Psychiatric/Behavioral:  Negative for depression and sleep disturbance.   All other systems reviewed and are negative.   PHYSICAL EXAMINATION: ECOG PERFORMANCE STATUS: 1 - Symptomatic but completely ambulatory  Vitals:   07/29/21 1325  BP: 131/72  Pulse: 65  Resp: 16  Temp: (!) 97.4 F (36.3 C)  SpO2: 97%   Filed Weights   07/29/21 1325  Weight: 150 lb 9.6 oz (68.3 kg)   Physical Exam Vitals reviewed.  Constitutional:      Appearance: Normal appearance.  Cardiovascular:     Rate and Rhythm: Normal rate and regular rhythm.     Pulses: Normal pulses.     Heart sounds: Normal heart sounds.  Pulmonary:     Effort: Pulmonary effort is normal.     Breath sounds: Normal breath sounds.  Abdominal:     Palpations: Abdomen is soft. There is no hepatomegaly, splenomegaly or mass.     Tenderness: There is no abdominal tenderness.  Musculoskeletal:     Right lower leg: No edema.     Left lower leg: No edema.  Lymphadenopathy:     Upper Body:     Right upper body: No supraclavicular or axillary adenopathy.     Left upper body: No  supraclavicular or axillary adenopathy.     Lower Body: No right inguinal adenopathy. No left inguinal adenopathy.  Neurological:     General: No focal deficit present.     Mental Status: He is alert and oriented to person, place, and time.  Psychiatric:        Mood and Affect: Mood normal.        Behavior: Behavior normal.     LABORATORY DATA:  I have reviewed the data as listed    Latest Ref Rng & Units 07/29/2021    1:57 PM 07/08/2021  5:25 AM 07/07/2021    5:24 AM  CBC  WBC 4.0 - 10.5 K/uL 7.6   14.3   15.5    Hemoglobin 13.0 - 17.0 g/dL 11.0   8.4   8.7    Hematocrit 39.0 - 52.0 % 36.2   27.4   27.1    Platelets 150 - 400 K/uL 296   383   382        Latest Ref Rng & Units 07/29/2021    1:57 PM 07/08/2021    5:25 AM 07/07/2021    5:24 AM  CMP  Glucose 70 - 99 mg/dL 129   169   162    BUN 8 - 23 mg/dL '10   23   22    '$ Creatinine 0.61 - 1.24 mg/dL 1.24   1.56   1.68    Sodium 135 - 145 mmol/L 141   143   145    Potassium 3.5 - 5.1 mmol/L 3.8   3.3   3.3    Chloride 98 - 111 mmol/L 109   113   116    CO2 22 - 32 mmol/L '26   19   17    '$ Calcium 8.9 - 10.3 mg/dL 9.0   8.0   8.3    Total Protein 6.5 - 8.1 g/dL 7.9      Total Bilirubin 0.3 - 1.2 mg/dL 0.3      Alkaline Phos 38 - 126 U/L 71      AST 15 - 41 U/L 21      ALT 0 - 44 U/L 12        RADIOGRAPHIC STUDIES: I have personally reviewed the radiological images as listed and agreed with the findings in the report. CT ABDOMEN PELVIS W CONTRAST  Result Date: 07/04/2021 CLINICAL DATA:  Right lower quadrant abdominal pain. History of recent perforated appendicitis. EXAM: CT ABDOMEN AND PELVIS WITH CONTRAST TECHNIQUE: Multidetector CT imaging of the abdomen and pelvis was performed using the standard protocol following bolus administration of intravenous contrast. RADIATION DOSE REDUCTION: This exam was performed according to the departmental dose-optimization program which includes automated exposure control, adjustment of the mA  and/or kV according to patient size and/or use of iterative reconstruction technique. CONTRAST:  50m OMNIPAQUE IOHEXOL 300 MG/ML  SOLN COMPARISON:  Multiple CT of the abdomen pelvis dating back to 04/26/2021. FINDINGS: Lower chest: Emphysematous changes of the visualized lung bases. No intra-abdominal free air. There is a multilobulated fluid collection containing several small pockets of air in the lower abdomen abutting the cecum and appendectomy suture. The fluid collection measures approximately 12 x 7 cm in greatest axial dimensions and 10 cm in craniocaudal length. There is somewhat organized wall surrounding this collection which extends into the right hemipelvis. Findings most consistent with developing abscess. This may be secondary to sutural dehiscence and leak. Surgical consult is advised. Hepatobiliary: Small cyst in the right lobe of the liver. No intrahepatic biliary ductal dilatation. The gallbladder is unremarkable. Pancreas: Unremarkable. No pancreatic ductal dilatation or surrounding inflammatory changes. Spleen: Normal in size without focal abnormality. Adrenals/Urinary Tract: The adrenal glands are unremarkable. Multiple bilateral renal cysts as seen on the prior CTs. There is no hydronephrosis on either side. There is symmetric enhancement and excretion of contrast by both kidneys. The visualized ureters and the urinary bladder appear unremarkable. Stomach/Bowel: There is no bowel obstruction. There is moderate stool throughout the colon. Postsurgical changes of appendectomy. Mildly thickened appearance of several loops of small bowel  in the lower abdomen, likely reactive to developing abscess. Vascular/Lymphatic: Mild aortoiliac atherosclerotic disease. There is a 2.7 cm infrarenal aortic ectasia. The IVC is unremarkable. No portal venous gas. There is no adenopathy. Reproductive: The prostate and seminal vesicles are grossly unremarkable no pelvic mass. Other: Small fat containing bilateral  inguinal hernias. Musculoskeletal: Degenerative changes of the spine. No acute osseous pathology. IMPRESSION: 1. Large fluid collection/abscess in the lower abdomen abutting the cecum and appendectomy suture. Sutural dehiscence and leak is not excluded. Surgical consult is advised. 2. No bowel obstruction. 3. Aortic Atherosclerosis (ICD10-I70.0). Electronically Signed   By: Anner Crete M.D.   On: 07/04/2021 23:33   CT IMAGE GUIDED DRAINAGE BY PERCUTANEOUS CATHETER  Result Date: 07/07/2021 INDICATION: Postop lower abdominopelvic fluid collection, concern for abscess EXAM: CT GUIDED LOWER ABDOMINOPELVIC FLUID COLLECTION DRAINAGE MEDICATIONS: The patient is currently admitted to the hospital and receiving intravenous antibiotics. The antibiotics were administered within an appropriate time frame prior to the initiation of the procedure. ANESTHESIA/SEDATION: Moderate (conscious) sedation was employed during this procedure. A total of Versed 2.0 mg and Fentanyl 50 mcg was administered intravenously by the radiology nurse. Total intra-service moderate Sedation Time: 16 minutes. The patient's level of consciousness and vital signs were monitored continuously by radiology nursing throughout the procedure under my direct supervision. COMPLICATIONS: None immediate. PROCEDURE: Informed written consent was obtained from the patient after a thorough discussion of the procedural risks, benefits and alternatives. All questions were addressed. Maximal Sterile Barrier Technique was utilized including caps, mask, sterile gowns, sterile gloves, sterile drape, hand hygiene and skin antiseptic. A timeout was performed prior to the initiation of the procedure. Previous imaging reviewed. The anterior abdominopelvic complex fluid collection was localized and marked for a left anterior approach. Under sterile conditions and local anesthesia, an 18 gauge 10 cm access was advanced from a left anterior approach into the complex fluid  collection. Syringe aspiration yielded malodorous dark bloody fluid compatible with infected hematoma. Guidewire inserted followed by tract dilatation to insert a 12 [place] drain. Drain catheter position confirmed with CT. Syringe aspiration yielded 580 cc of dark bloody fluid. Catheter secured with a Prolene suture and connected to external suction bulb. Sterile dressing applied. No immediate complication. Patient tolerated the procedure well. IMPRESSION: Successful CT-guided anterior lower abdominal abscess drain placement as above. Sample sent for culture. Electronically Signed   By: Jerilynn Mages.  Shick M.D.   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  Multiple kidney cysts.  No evidence of metastatic disease. - CT [facility] on 05/16/2021: Interval development of multiple fluid collections in the pelvis compatible with abscesses in the setting of perforated appendicitis.  Abscesses about the ileum, sigmoid colon and urinary bladder. - Drain placement on 05/20/2021 - CT [facility] on 05/28/2021: Significant interval improvement of periappendiceal inflammation and near complete resolution of abscess. - Laparoscopic appendectomy by Dr. Constance Haw on 06/26/2021 - [facility]AP (07/04/2021): Large fluid collection/abscess in the lower abdomen abutting the cecum and appendectomy suture. - Pathology:  Low-grade goblet cell adenocarcinoma.  Acute appendicitis.  Perforation not identified.  Grade 1, well-differentiated, tumor size 4.7 cm.  Positive proximal margin.  PT3PNX.   Social/family history: - He lives at home with his wife.  He is independent of ADLs and IADLs.  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  Quit smoking in 1997. -  Brother had lung cancer.   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 - From the pathology, it appears to be atypical GCA (Tang A) with a good prognosis.  However adverse features include T3 stage and the size of the tumor. - Agree with completion right hemicolectomy. - Adjuvant therapy is indicated in the setting of node positive disease.  It is also recommended with perforation with stage II disease. - I have recommended doing a CT of the chest to complete the staging work-up.  We will also check tumor markers including CEA and CA 19-9. - Recommend RTC 3 to 4 weeks after right hemicolectomy.  2.  Normocytic anemia: - Last CBC on 07/08/2021 with hemoglobin 8.4.  We will repeat CBC and check for anemia panel.   All questions were answered. The patient knows to call the clinic with any problems, questions or concerns.   Derek Jack, MD, 07/29/21 4:44 PM  [place] [phone]   I, Thana Ates, am acting as a scribe for Dr. Derek Jack.  I, Derek Jack MD, have reviewed the above documentation for accuracy and completeness, and I agree with the above.

## 2021-07-30 ENCOUNTER — Encounter (HOSPITAL_COMMUNITY): Payer: Self-pay

## 2021-07-30 LAB — CEA: CEA: 2.3 ng/mL (ref 0.0–4.7)

## 2021-07-30 LAB — CANCER ANTIGEN 19-9: CA 19-9: 21 U/mL (ref 0–35)

## 2021-07-30 NOTE — Progress Notes (Signed)
I met with the patient and his daughter during and following initial visit with Dr. Delton Coombes. I provided my contact information and encouraged the patient and family to call with questions or concerns.

## 2021-07-31 ENCOUNTER — Telehealth: Payer: Self-pay | Admitting: *Deleted

## 2021-07-31 ENCOUNTER — Encounter: Payer: Self-pay | Admitting: General Surgery

## 2021-07-31 ENCOUNTER — Ambulatory Visit (INDEPENDENT_AMBULATORY_CARE_PROVIDER_SITE_OTHER): Payer: No Typology Code available for payment source | Admitting: General Surgery

## 2021-07-31 VITALS — BP 116/69 | HR 79 | Temp 98.0°F | Resp 12 | Ht 69.0 in | Wt 150.0 lb

## 2021-07-31 DIAGNOSIS — C181 Malignant neoplasm of appendix: Secondary | ICD-10-CM

## 2021-07-31 LAB — PROTEIN ELECTROPHORESIS, SERUM
A/G Ratio: 0.9 (ref 0.7–1.7)
Albumin ELP: 3.3 g/dL (ref 2.9–4.4)
Alpha-1-Globulin: 0.4 g/dL (ref 0.0–0.4)
Alpha-2-Globulin: 0.8 g/dL (ref 0.4–1.0)
Beta Globulin: 1.1 g/dL (ref 0.7–1.3)
Gamma Globulin: 1.4 g/dL (ref 0.4–1.8)
Globulin, Total: 3.7 g/dL (ref 2.2–3.9)
Total Protein ELP: 7 g/dL (ref 6.0–8.5)

## 2021-07-31 MED ORDER — METRONIDAZOLE 500 MG PO TABS
1000.0000 mg | ORAL_TABLET | ORAL | 0 refills | Status: DC
Start: 2021-07-31 — End: 2021-08-11

## 2021-07-31 MED ORDER — NEOMYCIN SULFATE 500 MG PO TABS: 1000.0000 mg | ORAL_TABLET | ORAL | 0 refills | Status: DC

## 2021-07-31 NOTE — Patient Instructions (Signed)
Colon Preparation:  Buy from the Store: Miralax bottle (288g).  Gatorade 64 oz (not red). Dulcolax tablets.   The Day Prior to Surgery: Take 4 ducolax tablets at 7am with water. Drink plenty of clear liquids all day to avoid dehydration, no solid food.    Mix the bottle of Miralax and 64 oz of Gatorade and drink this mixture starting at 10am. Drink it gradually over the next few hours, 8 ounces every 15-30 minutes until it is gone. Finish this by 2pm.  Take 2 neomycin 500mg tablets and 2 metronidazole 500mg tablets at 2 pm. Take 2 neomycin 500mg tablets and 2 metronidazole 500mg tablets at 3pm. Take 2 neomycin 500mg tablets and 2 metronidazole 500mg tablets at 10pm.    Do not eat or drink anything after midnight the night before your surgery.  Do not eat or drink anything that morning, and take medications as instructed by the hospital staff on your preoperative visit.   

## 2021-07-31 NOTE — Progress Notes (Unsigned)
[name] History and Physical  Reason for Referral:*** Referring Physician: ***  Chief Complaint   Follow-up     Blake Rodriguez is a 71 y.o. male.  HPI: ***.  The *** started *** and has had a duration of ***.  It is associated with ***.  The *** is improved with ***, and is made worse with ***.    Quality*** Context***  Past Medical History:  Diagnosis Date   Diabetes mellitus without complication (HCC)    Hypertension    Lung collapse     Past Surgical History:  Procedure Laterality Date   IR RADIOLOGIST EVAL & MGMT  05/28/2021   IR RADIOLOGIST EVAL & MGMT  06/11/2021   LAPAROSCOPIC APPENDECTOMY N/A 06/26/2021   Procedure: APPENDECTOMY LAPAROSCOPIC ;  Surgeon: Lucretia Roers, MD;  Location: [facility];  Service: General;  Laterality: N/A;   LUNG BIOPSY  06/10/1983   SHOULDER ARTHROCENTESIS Left     No family history on file.  Social History   Tobacco Use   Smoking status: Former    Packs/day: 0.50    Years: 30.00    Pack years: 15.00    Types: Cigarettes    Quit date: 06/04/1993    Years since quitting: 28.1   Smokeless tobacco: Never  Vaping Use   Vaping Use: Never used  Substance Use Topics   Alcohol use: Yes    Comment: occassional   Drug use: Never    Medications: {medication reviewed/display:3041432} Allergies as of 07/31/2021   No Known Allergies      Medication List        Accurate as of July 31, 2021  1:33 PM. If you have any questions, ask your nurse or doctor.          acetaminophen 325 MG tablet Commonly known as: TYLENOL Take 2 tablets (650 mg total) by mouth every 6 (six) hours as needed for mild pain, fever or headache.   Alogliptin Benzoate 12.5 MG Tabs Take 12.5 mg by mouth daily.   aspirin EC 81 MG tablet Take 1 tablet (81 mg total) by mouth daily with breakfast.   atorvastatin 80 MG tablet Commonly known as: LIPITOR Take 80 mg by mouth daily.   empagliflozin 25 MG Tabs tablet Commonly known as:  JARDIANCE Take 25 mg by mouth daily.   escitalopram 10 MG tablet Commonly known as: LEXAPRO Take 10 mg by mouth daily.   ezetimibe 10 MG tablet Commonly known as: ZETIA Take 10 mg by mouth daily.   metFORMIN 500 MG 24 hr tablet Commonly known as: GLUCOPHAGE-XR Take 500 mg by mouth 2 (two) times daily.   metoprolol tartrate 25 MG tablet Commonly known as: LOPRESSOR Take 1 tablet (25 mg total) by mouth 2 (two) times daily.   NIFEdipine 90 MG 24 hr tablet Commonly known as: PROCARDIA XL/NIFEDICAL-XL Take 90 mg by mouth daily.   ondansetron 4 MG tablet Commonly known as: Zofran Take 1 tablet (4 mg total) by mouth every 8 (eight) hours as needed.   oxyCODONE 5 MG immediate release tablet Commonly known as: Roxicodone Take 1 tablet (5 mg total) by mouth every 4 (four) hours as needed for breakthrough pain or severe pain.   VITAMIN D3 PO Take 2 capsules by mouth daily.         ROS:  {Review of Systems:30496}  Blood pressure 116/69, pulse 79, temperature 98 F (36.7 C), temperature source Oral, resp. rate 12, height 5\' 9"  (1.753 m), weight 150 lb (68  kg), SpO2 97 %. Physical Exam  Results: Results for orders placed or performed in visit on 07/29/21 (from the past 48 hour(s))  Ferritin     Status: Abnormal   Collection Time: 07/29/21  1:57 PM  Result Value Ref Range   Ferritin 354 (H) 24 - 336 ng/mL    Comment: Performed at [facility], [address], [place], [place] [address]  Iron and TIBC ([facility] ONLY)     Status: None   Collection Time: 07/29/21  1:57 PM  Result Value Ref Range   Iron 56 45 - 182 ug/dL   TIBC 440 102 - 725 ug/dL   Saturation Ratios 19 17.9 - 39.5 %   UIBC 237 ug/dL    Comment: Performed at [facility], [address], [place], [place] [address]  Cancer antigen 19-9     Status: None   Collection Time: 07/29/21  1:57 PM  Result Value Ref Range   CA 19-9 21 0 - 35 U/mL    Comment: (NOTE) [employer]  Electrochemiluminescence Immunoassay (ECLIA) Values obtained with different assay methods or kits cannot be used interchangeably.  Results cannot be interpreted as absolute evidence of the presence or absence of malignant disease. Performed At: [facility] [address] [place], [place] [id number] Jolene Schimke MD [id number]   CEA     Status: None   Collection Time: 07/29/21  1:57 PM  Result Value Ref Range   CEA 2.3 0.0 - 4.7 ng/mL    Comment: (NOTE)                             Nonsmokers          <3.9                             Smokers             <5.6 [employer] Electrochemiluminescence Immunoassay (ECLIA) Values obtained with different assay methods or kits cannot be used interchangeably.  Results cannot be interpreted as absolute evidence of the presence or absence of malignant disease. Performed At: [facility] [address] [place], [place] [id number] Jolene Schimke MD [id number]   Comprehensive metabolic panel     Status: Abnormal   Collection Time: 07/29/21  1:57 PM  Result Value Ref Range   Sodium 141 135 - 145 mmol/L   Potassium 3.8 3.5 - 5.1 mmol/L   Chloride 109 98 - 111 mmol/L   CO2 26 22 - 32 mmol/L   Glucose, Bld 129 (H) 70 - 99 mg/dL    Comment: Glucose reference range applies only to samples taken after fasting for at least 8 hours.   BUN 10 8 - 23 mg/dL   Creatinine, Ser 5.73 0.61 - 1.24 mg/dL   Calcium 9.0 8.9 - 22.0 mg/dL   Total Protein 7.9 6.5 - 8.1 g/dL   Albumin 3.6 3.5 - 5.0 g/dL   AST 21 15 - 41 U/L   ALT 12 0 - 44 U/L   Alkaline Phosphatase 71 38 - 126 U/L   Total Bilirubin 0.3 0.3 - 1.2 mg/dL   GFR, Estimated >25 >42 mL/min    Comment: (NOTE) Calculated using the CKD-EPI Creatinine Equation (2021)    Anion gap 6 5 - 15    Comment: Performed at [facility], [address], [place], [place] [address]  CBC with Differential  Status: Abnormal   Collection Time: 07/29/21  1:57 PM  Result Value Ref Range    WBC 7.6 4.0 - 10.5 K/uL   RBC 3.81 (L) 4.22 - 5.81 MIL/uL   Hemoglobin 11.0 (L) 13.0 - 17.0 g/dL   HCT 16.1 (L) 09.6 - 04.5 %   MCV 95.0 80.0 - 100.0 fL   MCH 28.9 26.0 - 34.0 pg   MCHC 30.4 30.0 - 36.0 g/dL   RDW 40.9 (H) 81.1 - 91.4 %   Platelets 296 150 - 400 K/uL   nRBC 0.0 0.0 - 0.2 %   Neutrophils Relative % 60 %   Neutro Abs 4.6 1.7 - 7.7 K/uL   Lymphocytes Relative 30 %   Lymphs Abs 2.3 0.7 - 4.0 K/uL   Monocytes Relative 8 %   Monocytes Absolute 0.6 0.1 - 1.0 K/uL   Eosinophils Relative 1 %   Eosinophils Absolute 0.1 0.0 - 0.5 K/uL   Basophils Relative 1 %   Basophils Absolute 0.1 0.0 - 0.1 K/uL   Immature Granulocytes 0 %   Abs Immature Granulocytes 0.02 0.00 - 0.07 K/uL    Comment: Performed at [facility], [address], [place], [place] [address]  Folate     Status: None   Collection Time: 07/29/21  2:35 PM  Result Value Ref Range   Folate 15.1 >5.9 ng/mL    Comment: Performed at [facility], [address], [place], [place] [address]  Vitamin B12     Status: None   Collection Time: 07/29/21  2:35 PM  Result Value Ref Range   Vitamin B-12 226 180 - 914 pg/mL    Comment: (NOTE) This assay is not validated for testing neonatal or myeloproliferative syndrome specimens for Vitamin B12 levels. Performed at [facility], [address], [place], [place] [address]     No results found.   Assessment & Plan:  Blake Rodriguez is a 71 y.o. male with *** -*** -*** -Follow up ***  All questions were answered to the satisfaction of the patient and family***.  The risk and benefits of *** were discussed including but not limited to ***.  After careful consideration, Blake Rodriguez has decided to ***.    Lucretia Roers 07/31/2021, 1:33 PM

## 2021-07-31 NOTE — Telephone Encounter (Signed)
[facility]  [phone] telephone/ [phone]~ fax   Dr. Shearon Stalls (PCP)~ [facility]  [phone] personal cell phone   [facility] General Surgery Referral  [phone], ext. [phone]~ telephone/ [phone]~ fax   05/06/2021:  Perforated Appendix Authorization [id number]   07/29/2021:  Dr. Constance Haw [facility]   [id number]                        preliminary expiration 01/25/2022

## 2021-08-01 ENCOUNTER — Encounter: Payer: Self-pay | Admitting: General Surgery

## 2021-08-01 LAB — CHROMOGRANIN A: Chromogranin A (ng/mL): 78.8 ng/mL (ref 0.0–101.8)

## 2021-08-01 LAB — METHYLMALONIC ACID, SERUM: Methylmalonic Acid, Quantitative: 506 nmol/L — ABNORMAL HIGH (ref 0–378)

## 2021-08-01 NOTE — H&P (Signed)
[name] History and Physical   Chief Complaint   Follow-up     Blake Rodriguez is a 71 y.o. male.  HPI: Blake Rodriguez is known to me with recent  diagnosis of globet cell adenocarcinoma of the appendix after having an interval appendectomy for perforated appendicitis. He had to have an IR drain placed after surgery for a repeat abscess, and has been doing well with this draining minimal. He denies pain or fevers. He has seen Oncology and they agree that he needs a formal right hemicolectomy to ensure all the cancer is gone and to identify any lymph node involvement.  He is here today with his daughter. We have documented to the [place] that he needs his care closer to home given the drain, the diagnosis of cancer and need for continued visits and care.   Past Medical History:  Diagnosis Date   Diabetes mellitus without complication ([place])    Hypertension    Lung collapse     Past Surgical History:  Procedure Laterality Date   IR RADIOLOGIST EVAL & MGMT  05/28/2021   IR RADIOLOGIST EVAL & MGMT  06/11/2021   LAPAROSCOPIC APPENDECTOMY N/A 06/26/2021   Procedure: APPENDECTOMY LAPAROSCOPIC ;  Surgeon: [name], MD;  Location: [facility];  Service: General;  Laterality: N/A;   LUNG BIOPSY  06/10/1983   SHOULDER ARTHROCENTESIS Left     Family History  Problem Relation Age of Onset   Lung cancer Brother     Social History   Tobacco Use   Smoking status: Former    Packs/day: 0.50    Years: 30.00    Pack years: 15.00    Types: Cigarettes    Quit date: 06/04/1993    Years since quitting: 28.1   Smokeless tobacco: Never  Vaping Use   Vaping Use: Never used  Substance Use Topics   Alcohol use: Yes    Comment: occassional   Drug use: Never    Medications: I have reviewed the patient's current medications. Allergies as of 07/31/2021   No Known Allergies      Medication List        Accurate as of July 31, 2021 11:59 PM. If you have any questions, ask your  nurse or doctor.          acetaminophen 325 MG tablet Commonly known as: TYLENOL Take 2 tablets (650 mg total) by mouth every 6 (six) hours as needed for mild pain, fever or headache.   Alogliptin Benzoate 12.5 MG Tabs Take 12.5 mg by mouth Rodriguez.   aspirin EC 81 MG tablet Take 1 tablet (81 mg total) by mouth Rodriguez with breakfast.   atorvastatin 80 MG tablet Commonly known as: LIPITOR Take 80 mg by mouth Rodriguez.   empagliflozin 25 MG Tabs tablet Commonly known as: JARDIANCE Take 25 mg by mouth Rodriguez.   escitalopram 10 MG tablet Commonly known as: LEXAPRO Take 10 mg by mouth Rodriguez.   ezetimibe 10 MG tablet Commonly known as: ZETIA Take 10 mg by mouth Rodriguez.   metFORMIN 500 MG 24 hr tablet Commonly known as: GLUCOPHAGE-XR Take 500 mg by mouth 2 (two) times Rodriguez.   metoprolol tartrate 25 MG tablet Commonly known as: LOPRESSOR Take 1 tablet (25 mg total) by mouth 2 (two) times Rodriguez.   metroNIDAZOLE 500 MG tablet Commonly known as: Flagyl Take 2 tablets (1,000 mg total) by mouth as directed. Take 2 flagyl 500 mg tablets at 2pm, 3pm, and 10 pm the day before  surgery. Started by: [name], MD   neomycin 500 MG tablet Commonly known as: MYCIFRADIN Take 2 tablets (1,000 mg total) by mouth as directed. Take 2 neomycin '500mg'$  tablets at 2pm, 3pm, and 10pm the day before surgery. Started by: [name], MD   NIFEdipine 90 MG 24 hr tablet Commonly known as: PROCARDIA XL/NIFEDICAL-XL Take 90 mg by mouth Rodriguez.   ondansetron 4 MG tablet Commonly known as: Zofran Take 1 tablet (4 mg total) by mouth every 8 (eight) hours as needed.   oxyCODONE 5 MG immediate release tablet Commonly known as: Roxicodone Take 1 tablet (5 mg total) by mouth every 4 (four) hours as needed for breakthrough pain or severe pain.   VITAMIN D3 PO Take 2 capsules by mouth Rodriguez.         ROS:  A comprehensive review of systems was negative except for: Gastrointestinal:  positive for IR drain in place, minimal output, pink in color  Blood pressure 116/69, pulse 79, temperature 98 F (36.7 C), temperature source Oral, resp. rate 12, height '5\' 9"'$  (1.753 m), weight 150 lb (68 kg), SpO2 97 %. Physical Exam Vitals reviewed.  Constitutional:      Appearance: Normal appearance.  HENT:     Head: Normocephalic.     Mouth/Throat:     Mouth: Mucous membranes are moist.  Eyes:     Extraocular Movements: Extraocular movements intact.  Cardiovascular:     Rate and Rhythm: Normal rate and regular rhythm.  Pulmonary:     Effort: Pulmonary effort is normal.     Breath sounds: Normal breath sounds.  Abdominal:     General: There is no distension.     Palpations: Abdomen is soft.     Tenderness: There is no abdominal tenderness.     Comments: IR drain with serous fluid in bulb, port sites c/d/I with dermabond peeling still  Musculoskeletal:        General: Normal range of motion.     Cervical back: Normal range of motion.  Skin:    General: Skin is warm.  Neurological:     General: No focal deficit present.     Mental Status: He is alert.    Results: Reviewed   Assessment & Plan:  Blake Rodriguez is a 71 y.o. male with a globet cell adenocarcinoma of the appendix on pathology after interval appendectomy for perforated appendicitis. He needs a formal right hemicolectomy. He has had perforation and IR drainage of abscess now X2 and I discussed that given the prior infections and surgeries an open right hemicolectomy is going to be needed. Discussed risk of bleeding, infection, anastomotic leak, needing another drain, needing more treatment for the cancer.   He will be about 6 weeks out from his last drainage and about 8 weeks out from the lap appendectomy on June 12. He wants to proceed with this date. He knows I am going on vacation 6/14 and that my partners will be helping to care for him following his colectomy.  Discussed type and cross for blood and risk of  getting blood. Discussed bowel preparation and post operative course.   Colon Preparation: Buy from the Store: Miralax bottle [id number]).  Gatorade 64 oz (not red). Dulcolax tablets.   The Day Prior to Surgery: Take 4 ducolax tablets at 7am with water. Drink plenty of clear liquids all day to avoid dehydration, no solid food.    Mix the bottle of Miralax and 64 oz of Gatorade and drink  this mixture starting at 10am. Drink it gradually over the next few hours, 8 ounces every 15-30 minutes until it is gone. Finish this by 2pm.  Take 2 neomycin '500mg'$  tablets and 2 metronidazole '500mg'$  tablets at 2 pm. Take 2 neomycin '500mg'$  tablets and 2 metronidazole '500mg'$  tablets at 3pm. Take 2 neomycin '500mg'$  tablets and 2 metronidazole '500mg'$  tablets at 10pm.    Do not eat or drink anything after midnight the night before your surgery.  Do not eat or drink anything that morning, and take medications as instructed by the hospital staff on your preoperative visit.    All questions were answered to the satisfaction of the patient and family.   [name] 08/01/2021, 3:10 PM

## 2021-08-05 ENCOUNTER — Telehealth: Payer: Self-pay | Admitting: *Deleted

## 2021-08-05 MED ORDER — NIFEDIPINE ER OSMOTIC RELEASE 90 MG PO TB24
90.0000 mg | ORAL_TABLET | Freq: Every morning | ORAL | 0 refills | Status: AC
Start: 2021-08-05 — End: ?

## 2021-08-05 NOTE — Addendum Note (Signed)
Addended by: Sheral Flow on: 08/05/2021 12:07 PM   Modules accepted: Orders

## 2021-08-05 NOTE — Telephone Encounter (Signed)
30 day supply sent to pharmacy.  

## 2021-08-05 NOTE — Telephone Encounter (Signed)
Received call from patient.   Reports that he has run out of Procardia and requires refill. Advised to contact Dr. Shearon Stalls (PCP)~ [facility]~ [phone] telephone/ [phone]~ fax~ [phone]~ cell phone  Patient reports that [facility] [place] but [facility]. Also states that the [place] will mail out his medication instead of sending to retail pharmacy. Reports that delivery usually takes 5 days from request.   Call placed to [facility] for patient. Left message for PCP requesting that prescription be refilled to retail pharmacy.

## 2021-08-05 NOTE — Patient Instructions (Signed)
Blake Rodriguez  08/05/2021     '@PREFPERIOPPHARMACY'$ @   Your procedure is scheduled on 08/11/2021.   Report to [facility] at  0600  A.M.   Call this number if you have problems the morning of surgery:  [phone]    The day prior to surgery:         Take 4 ducolax tablets at 0700 with water. Drink plenty of clear liquids all day to avoid dehydration. NO solid food.  Mix the bottle of Miralax and 64 oz of gatorade and drink this mixture starting at 100 am. Drink it gradually over the next few hours, 8 ounces every 15-30 minutes until it is gone. Finish this by 2pm.  Take 2 neomycin 500 mg and 2 metronidazole 500 tablets at  2PM and 3 pm and 10 pm.    Drink 20 ounces of G2 gatorade at 10 pm.    Please drink plenty of liquids to stay hydrated during this process.    Do not eat after midnight.  You may drink clear liquids until  0330 am on 08/11/2021.      Clear liquids allowed are:                    Water, Juice (non-citric and without pulp - diabetics please choose diet or no sugar options), Carbonated beverages - (diabetics please choose diet or no sugar options), Clear Tea, Black Coffee only (no creamer, milk or cream including half and half), Plain Jell-O only (diabetics please choose diet or no sugar options), Gatorade (diabetics please choose diet or no sugar options), and Plain Popsicles only   At 0330 am on 08/11/2021, drink 1 carb drink. You can have nothing else to drink after this.      Take these medicines the morning of surgery with A SIP OF WATER                metoprolol, procardia, oxycodone(if needed).     Do not wear jewelry, make-up or nail polish.  Do not wear lotions, powders, or perfumes, or deodorant.  Do not shave 48 hours prior to surgery.  Men may shave face and neck.  Do not bring valuables to the hospital.  [facility] is not responsible for any belongings or valuables.  Contacts, dentures or bridgework may not be worn into surgery.   Leave your suitcase in the car.  After surgery it may be brought to your room.  For patients admitted to the hospital, discharge time will be determined by your treatment team.  Patients discharged the day of surgery will not be allowed to drive home and must have someone with them for 24 hours.    Special instructions:   DO NOT smoke tobacco or vape for 24 hours before your procedure.  Please read over the following fact sheets that you were given. Pain Booklet, Coughing and Deep Breathing, Blood Transfusion Information, Surgical Site Infection Prevention, Anesthesia Post-op Instructions, and Care and Recovery After Surgery      Open Total Colectomy, Care After This sheet gives you information about how to care for yourself after your procedure. Your health care provider may also give you more specific instructions. If you have problems or questions, contact your health care provider. What can I expect after the procedure? After the procedure, it is common to have: Pain in your abdomen, especially along your incision. Tiredness. Your energy level will return to normal over the  next several weeks. Constipation. Nausea. Difficulty urinating. Follow these instructions at home: Medicines Take over-the-counter and prescription medicines, including stool softeners, only as told by your health care provider. Ask your health care provider if the medicine prescribed to you requires you to avoid driving or using machinery. Eating and drinking Follow instructions from your health care provider about eating or drinking restrictions. Eat a low-fiber diet for the first 4 weeks after surgery. Most people on a low-fiber eating plan should eat less than 10 grams (g) of fiber a day. Follow recommendations from your health care provider or dietitian about how much fiber you should have each day. Always check food labels to know the fiber content of packaged foods. In general, a low-fiber food will have  fewer than 2 g of fiber a serving. In general, try to avoid whole grains, raw fruits and vegetables, dried fruit, tough cuts of meat, nuts, and seeds. Incision care  Follow instructions from your health care provider about how to take care of your incision. Make sure you: Wash your hands with soap and water for at least 20 seconds before and after you change your bandage (dressing). If soap and water are not available, use hand sanitizer. Change your dressing as told by your health care provider. Leave stitches (sutures), staples, or adhesive strips in place. These skin closures may need to stay in place for 2 weeks or longer. If adhesive strip edges start to loosen and curl up, you may trim the loose edges. Do not remove adhesive strips completely unless your health care provider tells you to do that. Avoid wearing tight clothing around your incision. Protect your incision area from the sun. Do not take baths, swim, or use a hot tub until your health care provider approves. Ask your health care provider if you may take showers. You may only be allowed to take sponge baths. Check your incision area every day for signs of infection. Check for: More redness, swelling, or pain. Fluid or blood. Warmth. Pus or a bad smell. Managing constipation Your procedure may cause constipation. To prevent or treat constipation, you may need to: Drink enough fluid to keep your urine pale yellow. Take over-the-counter or prescription medicines. Activity  Rest as told by your health care provider. Avoid sitting for a long time without moving. Get up to take short walks every 1-2 hours. This is important to improve blood flow and breathing. Ask for help if you feel weak or unsteady. Avoid activities that require a lot of energy for 4-6 weeks after surgery, such as running, climbing, and lifting heavy objects. You may need to continue to do breathing exercises. Do not lift anything that is heavier than 10 lb  (4.5 kg), or the limit that you are told, until your health care provider says that it is safe. Do not drive until you are no longer taking prescription pain medicines and you feel you are able to drive safely. This might take up to 4 weeks. Return to your normal activities as told by your health care provider. Ask your health care provider what activities are safe for you. General instructions If you have an opening (stoma) in your abdomen, follow instructions from your health care provider about how to care for the stoma and the external pouch attached to it (ostomy pouch). Do not use any products that contain nicotine or tobacco, such as cigarettes, e-cigarettes, and chewing tobacco. These can delay incision healing after surgery. If you need help quitting, ask your  health care provider. Keep all follow-up visits as told by your health care provider. This is important. Contact a health care provider if: You have any of these signs of infection: More redness, swelling, or pain around your incision. Fluid or blood coming from your incision. Warmth coming from your incision. Pus or a bad smell coming from your incision. A fever or chills. You do not have a bowel movement within 2-3 days after surgery. You cannot eat or drink for 24 hours or longer. You have nausea and vomiting that will not go away. You have pain in your abdomen that gets worse and does not get better with medicine. Get help right away if: You have chest pain. You have shortness of breath. You have pain or swelling in your legs. Your incision breaks open after your sutures, staples, or adhesive strips have been removed. You have bleeding from the rectum. These symptoms may represent a serious problem that is an emergency. Do not wait to see if the symptoms will go away. Get medical help right away. Call your local emergency services (911 in the [place] Do not drive yourself to the hospital. Summary After your procedure, it  is common to have pain, tiredness, constipation, nausea, and difficulty urinating. If you have an opening (stoma) in your abdomen, follow instructions from your health care provider about how to care for the stoma and the external pouch attached to it (ostomy pouch). Follow instructions from your health care provider about eating and drinking and about how to take care of your incision. Keep all follow-up visits as told by your health care provider. This is important. This information is not intended to replace advice given to you by your health care provider. Make sure you discuss any questions you have with your health care provider. Document Revised: 01/25/2019 Document Reviewed: 01/25/2019 Elsevier Patient Education  [place] Anesthesia, Adult, Care After This sheet gives you information about how to care for yourself after your procedure. Your health care provider may also give you more specific instructions. If you have problems or questions, contact your health care provider. What can I expect after the procedure? After the procedure, the following side effects are common: Pain or discomfort at the IV site. Nausea. Vomiting. Sore throat. Trouble concentrating. Feeling cold or chills. Feeling weak or tired. Sleepiness and fatigue. Soreness and body aches. These side effects can affect parts of the body that were not involved in surgery. Follow these instructions at home: For the time period you were told by your health care provider:  Rest. Do not participate in activities where you could fall or become injured. Do not drive or use machinery. Do not drink alcohol. Do not take sleeping pills or medicines that cause drowsiness. Do not make important decisions or sign legal documents. Do not take care of children on your own. Eating and drinking Follow any instructions from your health care provider about eating or drinking restrictions. When you feel hungry,  start by eating small amounts of foods that are soft and easy to digest (bland), such as toast. Gradually return to your regular diet. Drink enough fluid to keep your urine pale yellow. If you vomit, rehydrate by drinking water, juice, or clear broth. General instructions If you have sleep apnea, surgery and certain medicines can increase your risk for breathing problems. Follow instructions from your health care provider about wearing your sleep device: Anytime you are sleeping, including during daytime naps. While taking prescription pain medicines, sleeping  medicines, or medicines that make you drowsy. Have a responsible adult stay with you for the time you are told. It is important to have someone help care for you until you are awake and alert. Return to your normal activities as told by your health care provider. Ask your health care provider what activities are safe for you. Take over-the-counter and prescription medicines only as told by your health care provider. If you smoke, do not smoke without supervision. Keep all follow-up visits as told by your health care provider. This is important. Contact a health care provider if: You have nausea or vomiting that does not get better with medicine. You cannot eat or drink without vomiting. You have pain that does not get better with medicine. You are unable to pass urine. You develop a skin rash. You have a fever. You have redness around your IV site that gets worse. Get help right away if: You have difficulty breathing. You have chest pain. You have blood in your urine or stool, or you vomit blood. Summary After the procedure, it is common to have a sore throat or nausea. It is also common to feel tired. Have a responsible adult stay with you for the time you are told. It is important to have someone help care for you until you are awake and alert. When you feel hungry, start by eating small amounts of foods that are soft and easy to  digest (bland), such as toast. Gradually return to your regular diet. Drink enough fluid to keep your urine pale yellow. Return to your normal activities as told by your health care provider. Ask your health care provider what activities are safe for you. This information is not intended to replace advice given to you by your health care provider. Make sure you discuss any questions you have with your health care provider. Document Revised: 11/02/2019 Document Reviewed: 06/01/2019 Elsevier Patient Education  [place]. How to Use Chlorhexidine for Bathing Chlorhexidine gluconate (CHG) is a germ-killing (antiseptic) solution that is used to clean the skin. It can get rid of the bacteria that normally live on the skin and can keep them away for about 24 hours. To clean your skin with CHG, you may be given: A CHG solution to use in the shower or as part of a sponge bath. A prepackaged cloth that contains CHG. Cleaning your skin with CHG may help lower the risk for infection: While you are staying in the intensive care unit of the hospital. If you have a vascular access, such as a central line, to provide short-term or long-term access to your veins. If you have a catheter to drain urine from your bladder. If you are on a ventilator. A ventilator is a machine that helps you breathe by moving air in and out of your lungs. After surgery. What are the risks? Risks of using CHG include: A skin reaction. Hearing loss, if CHG gets in your ears and you have a perforated eardrum. Eye injury, if CHG gets in your eyes and is not rinsed out. The CHG product catching fire. Make sure that you avoid smoking and flames after applying CHG to your skin. Do not use CHG: If you have a chlorhexidine allergy or have previously reacted to chlorhexidine. On babies younger than 66 months of age. How to use CHG solution Use CHG only as told by your health care provider, and follow the instructions on the  label. Use the full amount of CHG as directed.  Usually, this is one bottle. During a shower Follow these steps when using CHG solution during a shower (unless your health care provider gives you different instructions): Start the shower. Use your normal soap and shampoo to wash your face and hair. Turn off the shower or move out of the shower stream. Pour the CHG onto a clean washcloth. Do not use any type of brush or rough-edged sponge. Starting at your neck, lather your body down to your toes. Make sure you follow these instructions: If you will be having surgery, pay special attention to the part of your body where you will be having surgery. Scrub this area for at least 1 minute. Do not use CHG on your head or face. If the solution gets into your ears or eyes, rinse them well with water. Avoid your genital area. Avoid any areas of skin that have broken skin, cuts, or scrapes. Scrub your back and under your arms. Make sure to wash skin folds. Let the lather sit on your skin for 1-2 minutes or as long as told by your health care provider. Thoroughly rinse your entire body in the shower. Make sure that all body creases and crevices are rinsed well. Dry off with a clean towel. Do not put any substances on your body afterward--such as powder, lotion, or perfume--unless you are told to do so by your health care provider. Only use lotions that are recommended by the manufacturer. Put on clean clothes or pajamas. If it is the night before your surgery, sleep in clean sheets.  During a sponge bath Follow these steps when using CHG solution during a sponge bath (unless your health care provider gives you different instructions): Use your normal soap and shampoo to wash your face and hair. Pour the CHG onto a clean washcloth. Starting at your neck, lather your body down to your toes. Make sure you follow these instructions: If you will be having surgery, pay special attention to the part of your  body where you will be having surgery. Scrub this area for at least 1 minute. Do not use CHG on your head or face. If the solution gets into your ears or eyes, rinse them well with water. Avoid your genital area. Avoid any areas of skin that have broken skin, cuts, or scrapes. Scrub your back and under your arms. Make sure to wash skin folds. Let the lather sit on your skin for 1-2 minutes or as long as told by your health care provider. Using a different clean, wet washcloth, thoroughly rinse your entire body. Make sure that all body creases and crevices are rinsed well. Dry off with a clean towel. Do not put any substances on your body afterward--such as powder, lotion, or perfume--unless you are told to do so by your health care provider. Only use lotions that are recommended by the manufacturer. Put on clean clothes or pajamas. If it is the night before your surgery, sleep in clean sheets. How to use CHG prepackaged cloths Only use CHG cloths as told by your health care provider, and follow the instructions on the label. Use the CHG cloth on clean, dry skin. Do not use the CHG cloth on your head or face unless your health care provider tells you to. When washing with the CHG cloth: Avoid your genital area. Avoid any areas of skin that have broken skin, cuts, or scrapes. Before surgery Follow these steps when using a CHG cloth to clean before surgery (unless your health care provider  gives you different instructions): Using the CHG cloth, vigorously scrub the part of your body where you will be having surgery. Scrub using a back-and-forth motion for 3 minutes. The area on your body should be completely wet with CHG when you are done scrubbing. Do not rinse. Discard the cloth and let the area air-dry. Do not put any substances on the area afterward, such as powder, lotion, or perfume. Put on clean clothes or pajamas. If it is the night before your surgery, sleep in clean sheets.  For  general bathing Follow these steps when using CHG cloths for general bathing (unless your health care provider gives you different instructions). Use a separate CHG cloth for each area of your body. Make sure you wash between any folds of skin and between your fingers and toes. Wash your body in the following order, switching to a new cloth after each step: The front of your neck, shoulders, and chest. Both of your arms, under your arms, and your hands. Your stomach and groin area, avoiding the genitals. Your right leg and foot. Your left leg and foot. The back of your neck, your back, and your buttocks. Do not rinse. Discard the cloth and let the area air-dry. Do not put any substances on your body afterward--such as powder, lotion, or perfume--unless you are told to do so by your health care provider. Only use lotions that are recommended by the manufacturer. Put on clean clothes or pajamas. Contact a health care provider if: Your skin gets irritated after scrubbing. You have questions about using your solution or cloth. You swallow any chlorhexidine. Call your local poison control center (1-(406) 616-2896 in the [place] Get help right away if: Your eyes itch badly, or they become very red or swollen. Your skin itches badly and is red or swollen. Your hearing changes. You have trouble seeing. You have swelling or tingling in your mouth or throat. You have trouble breathing. These symptoms may represent a serious problem that is an emergency. Do not wait to see if the symptoms will go away. Get medical help right away. Call your local emergency services (911 in the [place] Do not drive yourself to the hospital. Summary Chlorhexidine gluconate (CHG) is a germ-killing (antiseptic) solution that is used to clean the skin. Cleaning your skin with CHG may help to lower your risk for infection. You may be given CHG to use for bathing. It may be in a bottle or in a prepackaged cloth to use on your  skin. Carefully follow your health care provider's instructions and the instructions on the product label. Do not use CHG if you have a chlorhexidine allergy. Contact your health care provider if your skin gets irritated after scrubbing. This information is not intended to replace advice given to you by your health care provider. Make sure you discuss any questions you have with your health care provider. Document Revised: 04/29/2020 Document Reviewed: 04/29/2020 Elsevier Patient Education  [place].

## 2021-08-06 ENCOUNTER — Encounter (HOSPITAL_COMMUNITY): Payer: Self-pay

## 2021-08-06 ENCOUNTER — Encounter (HOSPITAL_COMMUNITY)
Admission: RE | Admit: 2021-08-06 | Discharge: 2021-08-06 | Disposition: A | Discharge status: Home / Self Care | Payer: No Typology Code available for payment source | Source: Ambulatory Visit | Attending: General Surgery | Admitting: General Surgery

## 2021-08-06 VITALS — BP 169/75 | HR 85 | Temp 98.2°F | Resp 16 | Ht 69.0 in | Wt 150.0 lb

## 2021-08-06 DIAGNOSIS — Z01812 Encounter for preprocedural laboratory examination: Secondary | ICD-10-CM | POA: Diagnosis present

## 2021-08-06 DIAGNOSIS — E1165 Type 2 diabetes mellitus with hyperglycemia: Secondary | ICD-10-CM | POA: Diagnosis not present

## 2021-08-06 DIAGNOSIS — C181 Malignant neoplasm of appendix: Secondary | ICD-10-CM | POA: Insufficient documentation

## 2021-08-06 HISTORY — DX: Unspecified osteoarthritis, unspecified site: M19.90

## 2021-08-06 LAB — HEMOGLOBIN A1C
Hgb A1c MFr Bld: 6.7 % — ABNORMAL HIGH (ref 4.8–5.6)
Mean Plasma Glucose: 145.59 mg/dL

## 2021-08-06 LAB — PREPARE RBC (CROSSMATCH)

## 2021-08-06 NOTE — Progress Notes (Signed)
   08/06/21 1529  OBSTRUCTIVE SLEEP APNEA  Have you ever been diagnosed with sleep apnea through a sleep study? No  Do you snore loudly (loud enough to be heard through closed doors)?  1  Do you often feel tired, fatigued, or sleepy during the daytime (such as falling asleep during driving or talking to someone)? 0  Has anyone observed you stop breathing during your sleep? 1  Do you have, or are you being treated for high blood pressure? 1  BMI more than 35 kg/m2? 0  Age > 50 (1-yes) 1  Neck circumference greater than:Male 16 inches or larger, Male 17inches or larger? 0  Male Gender (Yes=1) 1  Obstructive Sleep Apnea Score 5  Score 5 or greater  Results sent to PCP

## 2021-08-07 ENCOUNTER — Ambulatory Visit (HOSPITAL_COMMUNITY)
Admission: RE | Admit: 2021-08-07 | Discharge: 2021-08-07 | Disposition: A | Discharge status: Home / Self Care | Payer: Medicare (Managed Care) | Source: Ambulatory Visit | Attending: Hematology | Admitting: Hematology

## 2021-08-07 DIAGNOSIS — C181 Malignant neoplasm of appendix: Secondary | ICD-10-CM | POA: Insufficient documentation

## 2021-08-07 MED ORDER — IOHEXOL 300 MG/ML  SOLN
75.0000 mL | Freq: Once | INTRAMUSCULAR | Status: AC | PRN
  Administered 2021-08-07: 75 mL via INTRAVENOUS

## 2021-08-07 MED ORDER — SODIUM CHLORIDE (PF) 0.9 % IJ SOLN
INTRAMUSCULAR | Status: AC
  Filled 2021-08-07: qty 50

## 2021-08-11 ENCOUNTER — Other Ambulatory Visit: Payer: Self-pay

## 2021-08-11 ENCOUNTER — Inpatient Hospital Stay (HOSPITAL_COMMUNITY)
Admission: RE | Admit: 2021-08-11 | Discharge: 2021-08-14 | DRG: 330 | Disposition: A | Discharge status: Home / Self Care | Payer: No Typology Code available for payment source | Attending: General Surgery | Admitting: General Surgery

## 2021-08-11 ENCOUNTER — Inpatient Hospital Stay (HOSPITAL_COMMUNITY): Payer: No Typology Code available for payment source | Admitting: Certified Registered Nurse Anesthetist

## 2021-08-11 ENCOUNTER — Encounter (HOSPITAL_COMMUNITY): Payer: Self-pay | Admitting: General Surgery

## 2021-08-11 ENCOUNTER — Encounter (HOSPITAL_COMMUNITY)
Admission: RE | Disposition: A | Discharge status: Home / Self Care | Payer: Self-pay | Source: Home / Self Care | Attending: General Surgery

## 2021-08-11 DIAGNOSIS — C181 Malignant neoplasm of appendix: Secondary | ICD-10-CM | POA: Diagnosis present

## 2021-08-11 DIAGNOSIS — C180 Malignant neoplasm of cecum: Secondary | ICD-10-CM

## 2021-08-11 DIAGNOSIS — I10 Essential (primary) hypertension: Secondary | ICD-10-CM | POA: Diagnosis present

## 2021-08-11 DIAGNOSIS — E119 Type 2 diabetes mellitus without complications: Secondary | ICD-10-CM | POA: Diagnosis present

## 2021-08-11 DIAGNOSIS — D62 Acute posthemorrhagic anemia: Secondary | ICD-10-CM | POA: Diagnosis not present

## 2021-08-11 DIAGNOSIS — Z801 Family history of malignant neoplasm of trachea, bronchus and lung: Secondary | ICD-10-CM | POA: Diagnosis not present

## 2021-08-11 DIAGNOSIS — E876 Hypokalemia: Secondary | ICD-10-CM | POA: Diagnosis not present

## 2021-08-11 DIAGNOSIS — Z87891 Personal history of nicotine dependence: Secondary | ICD-10-CM | POA: Diagnosis not present

## 2021-08-11 HISTORY — PX: BOWEL RESECTION: SHX1257

## 2021-08-11 LAB — ABO/RH: ABO/RH(D): A POS

## 2021-08-11 LAB — GLUCOSE, CAPILLARY
Glucose-Capillary: 132 mg/dL — ABNORMAL HIGH (ref 70–99)
Glucose-Capillary: 144 mg/dL — ABNORMAL HIGH (ref 70–99)

## 2021-08-11 SURGERY — EXCISION, SMALL INTESTINE
Anesthesia: General | Site: Abdomen

## 2021-08-11 MED ORDER — ROCURONIUM BROMIDE 100 MG/10ML IV SOLN
INTRAVENOUS | Status: DC | PRN
  Administered 2021-08-11: 70 mg via INTRAVENOUS

## 2021-08-11 MED ORDER — 0.9 % SODIUM CHLORIDE (POUR BTL) OPTIME
TOPICAL | Status: DC | PRN
  Administered 2021-08-11 (×2): 1000 mL
  Administered 2021-08-11: 500 mL
  Administered 2021-08-11: 1000 mL

## 2021-08-11 MED ORDER — ZOLPIDEM TARTRATE 5 MG PO TABS: 5.0000 mg | ORAL_TABLET | Freq: Every evening | ORAL | Status: DC | PRN

## 2021-08-11 MED ORDER — PROPOFOL 10 MG/ML IV BOLUS
INTRAVENOUS | Status: AC
  Filled 2021-08-11: qty 20

## 2021-08-11 MED ORDER — ESCITALOPRAM OXALATE 10 MG PO TABS
10.0000 mg | ORAL_TABLET | Freq: Every day | ORAL | Status: DC
  Administered 2021-08-11 – 2021-08-13 (×3): 10 mg via ORAL
  Filled 2021-08-11 (×3): qty 1

## 2021-08-11 MED ORDER — GABAPENTIN 300 MG PO CAPS
300.0000 mg | ORAL_CAPSULE | Freq: Two times a day (BID) | ORAL | Status: DC
  Administered 2021-08-11 – 2021-08-14 (×6): 300 mg via ORAL
  Filled 2021-08-11 (×7): qty 1

## 2021-08-11 MED ORDER — METOPROLOL TARTRATE 25 MG PO TABS
25.0000 mg | ORAL_TABLET | Freq: Two times a day (BID) | ORAL | Status: DC
  Administered 2021-08-11 – 2021-08-14 (×6): 25 mg via ORAL
  Filled 2021-08-11 (×6): qty 1

## 2021-08-11 MED ORDER — DEXAMETHASONE SODIUM PHOSPHATE 4 MG/ML IJ SOLN
INTRAMUSCULAR | Status: DC | PRN
  Administered 2021-08-11: 10 mg via INTRAVENOUS

## 2021-08-11 MED ORDER — ONDANSETRON HCL 4 MG/2ML IJ SOLN: 4.0000 mg | Freq: Once | INTRAMUSCULAR | Status: DC | PRN

## 2021-08-11 MED ORDER — PIPERACILLIN-TAZOBACTAM 3.375 G IVPB
3.3750 g | Freq: Three times a day (TID) | INTRAVENOUS | Status: AC
  Administered 2021-08-11 – 2021-08-13 (×6): 3.375 g via INTRAVENOUS
  Filled 2021-08-11 (×6): qty 50

## 2021-08-11 MED ORDER — SODIUM CHLORIDE 0.9 % IV SOLN
2.0000 g | INTRAVENOUS | Status: AC
  Administered 2021-08-11: 2 g via INTRAVENOUS
  Filled 2021-08-11: qty 2

## 2021-08-11 MED ORDER — ENSURE PRE-SURGERY PO LIQD
592.0000 mL | Freq: Once | ORAL | Status: DC
  Filled 2021-08-11: qty 592

## 2021-08-11 MED ORDER — ALVIMOPAN 12 MG PO CAPS
12.0000 mg | ORAL_CAPSULE | Freq: Two times a day (BID) | ORAL | Status: DC
  Administered 2021-08-12 (×2): 12 mg via ORAL
  Filled 2021-08-11 (×3): qty 1

## 2021-08-11 MED ORDER — SACCHAROMYCES BOULARDII 250 MG PO CAPS
250.0000 mg | ORAL_CAPSULE | Freq: Two times a day (BID) | ORAL | Status: DC
  Administered 2021-08-12 – 2021-08-14 (×5): 250 mg via ORAL
  Filled 2021-08-11 (×5): qty 1

## 2021-08-11 MED ORDER — ALOGLIPTIN BENZOATE 12.5 MG PO TABS: 12.5000 mg | ORAL_TABLET | Freq: Every morning | ORAL | Status: DC

## 2021-08-11 MED ORDER — SUGAMMADEX SODIUM 200 MG/2ML IV SOLN
INTRAVENOUS | Status: DC | PRN
  Administered 2021-08-11: 272 mg via INTRAVENOUS

## 2021-08-11 MED ORDER — FENTANYL CITRATE (PF) 100 MCG/2ML IJ SOLN
INTRAMUSCULAR | Status: DC | PRN
Start: 2021-08-11 — End: 2021-08-11
  Administered 2021-08-11 (×5): 50 ug via INTRAVENOUS

## 2021-08-11 MED ORDER — LACTATED RINGERS IV SOLN: INTRAVENOUS | Status: DC

## 2021-08-11 MED ORDER — ALVIMOPAN 12 MG PO CAPS
12.0000 mg | ORAL_CAPSULE | ORAL | Status: AC
  Administered 2021-08-11: 12 mg via ORAL
  Filled 2021-08-11: qty 1

## 2021-08-11 MED ORDER — ACETAMINOPHEN 500 MG PO TABS
1000.0000 mg | ORAL_TABLET | ORAL | Status: AC
  Administered 2021-08-11: 1000 mg via ORAL
  Filled 2021-08-11: qty 2

## 2021-08-11 MED ORDER — DIPHENHYDRAMINE HCL 50 MG/ML IJ SOLN: 12.5000 mg | Freq: Four times a day (QID) | INTRAMUSCULAR | Status: DC | PRN

## 2021-08-11 MED ORDER — KETOROLAC TROMETHAMINE 15 MG/ML IJ SOLN: 15.0000 mg | Freq: Four times a day (QID) | INTRAMUSCULAR | Status: DC | PRN

## 2021-08-11 MED ORDER — CHLORHEXIDINE GLUCONATE 0.12 % MT SOLN
15.0000 mL | Freq: Once | OROMUCOSAL | Status: AC
  Administered 2021-08-11: 15 mL via OROMUCOSAL

## 2021-08-11 MED ORDER — SIMETHICONE 80 MG PO CHEW: 40.0000 mg | CHEWABLE_TABLET | Freq: Four times a day (QID) | ORAL | Status: DC | PRN

## 2021-08-11 MED ORDER — MORPHINE SULFATE (PF) 2 MG/ML IV SOLN: 2.0000 mg | INTRAVENOUS | Status: DC | PRN

## 2021-08-11 MED ORDER — ONDANSETRON HCL 4 MG PO TABS: 4.0000 mg | ORAL_TABLET | Freq: Four times a day (QID) | ORAL | Status: DC | PRN

## 2021-08-11 MED ORDER — METOPROLOL TARTRATE 5 MG/5ML IV SOLN: 5.0000 mg | Freq: Four times a day (QID) | INTRAVENOUS | Status: DC | PRN

## 2021-08-11 MED ORDER — FENTANYL CITRATE PF 50 MCG/ML IJ SOSY: 25.0000 ug | PREFILLED_SYRINGE | INTRAMUSCULAR | Status: DC | PRN

## 2021-08-11 MED ORDER — ENSURE SURGERY PO LIQD
237.0000 mL | Freq: Two times a day (BID) | ORAL | Status: DC
  Administered 2021-08-12 – 2021-08-14 (×3): 237 mL via ORAL
  Filled 2021-08-11 (×9): qty 237

## 2021-08-11 MED ORDER — HEPARIN SODIUM (PORCINE) 5000 UNIT/ML IJ SOLN
5000.0000 [IU] | Freq: Three times a day (TID) | INTRAMUSCULAR | Status: DC
  Administered 2021-08-12 – 2021-08-13 (×4): 5000 [IU] via SUBCUTANEOUS
  Filled 2021-08-11 (×4): qty 1

## 2021-08-11 MED ORDER — CHLORHEXIDINE GLUCONATE CLOTH 2 % EX PADS: 6.0000 | MEDICATED_PAD | Freq: Once | CUTANEOUS | Status: DC

## 2021-08-11 MED ORDER — OXYCODONE HCL 5 MG PO TABS: 5.0000 mg | ORAL_TABLET | ORAL | Status: DC | PRN

## 2021-08-11 MED ORDER — ONDANSETRON HCL 4 MG/2ML IJ SOLN
INTRAMUSCULAR | Status: DC | PRN
  Administered 2021-08-11: 4 mg via INTRAVENOUS

## 2021-08-11 MED ORDER — DIPHENHYDRAMINE HCL 12.5 MG/5ML PO ELIX: 12.5000 mg | ORAL_SOLUTION | Freq: Four times a day (QID) | ORAL | Status: DC | PRN

## 2021-08-11 MED ORDER — FENTANYL CITRATE (PF) 250 MCG/5ML IJ SOLN
INTRAMUSCULAR | Status: AC
  Filled 2021-08-11: qty 5

## 2021-08-11 MED ORDER — EZETIMIBE 10 MG PO TABS
10.0000 mg | ORAL_TABLET | Freq: Every morning | ORAL | Status: DC
  Administered 2021-08-12 – 2021-08-14 (×3): 10 mg via ORAL
  Filled 2021-08-11 (×3): qty 1

## 2021-08-11 MED ORDER — ALUM & MAG HYDROXIDE-SIMETH 200-200-20 MG/5ML PO SUSP: 30.0000 mL | Freq: Four times a day (QID) | ORAL | Status: DC | PRN

## 2021-08-11 MED ORDER — ONDANSETRON HCL 4 MG/2ML IJ SOLN: 4.0000 mg | Freq: Four times a day (QID) | INTRAMUSCULAR | Status: DC | PRN

## 2021-08-11 MED ORDER — HEPARIN SODIUM (PORCINE) 5000 UNIT/ML IJ SOLN
5000.0000 [IU] | Freq: Once | INTRAMUSCULAR | Status: AC
  Administered 2021-08-11: 5000 [IU] via SUBCUTANEOUS
  Filled 2021-08-11: qty 1

## 2021-08-11 MED ORDER — BUPIVACAINE LIPOSOME 1.3 % IJ SUSP
INTRAMUSCULAR | Status: DC | PRN
  Administered 2021-08-11: 20 mL

## 2021-08-11 MED ORDER — NIFEDIPINE ER OSMOTIC RELEASE 30 MG PO TB24
90.0000 mg | ORAL_TABLET | Freq: Every morning | ORAL | Status: DC
  Administered 2021-08-12 – 2021-08-14 (×3): 90 mg via ORAL
  Filled 2021-08-11 (×3): qty 3

## 2021-08-11 MED ORDER — PROPOFOL 10 MG/ML IV BOLUS
INTRAVENOUS | Status: DC | PRN
  Administered 2021-08-11: 150 mg via INTRAVENOUS

## 2021-08-11 MED ORDER — LIDOCAINE HCL (CARDIAC) PF 100 MG/5ML IV SOSY
PREFILLED_SYRINGE | INTRAVENOUS | Status: DC | PRN
  Administered 2021-08-11: 80 mg via INTRATRACHEAL

## 2021-08-11 MED ORDER — ENSURE PRE-SURGERY PO LIQD
296.0000 mL | Freq: Once | ORAL | Status: DC
  Filled 2021-08-11: qty 296

## 2021-08-11 MED ORDER — ACETAMINOPHEN 500 MG PO TABS
1000.0000 mg | ORAL_TABLET | Freq: Four times a day (QID) | ORAL | Status: DC
  Administered 2021-08-11 – 2021-08-14 (×11): 1000 mg via ORAL
  Filled 2021-08-11 (×14): qty 2

## 2021-08-11 MED ORDER — ORAL CARE MOUTH RINSE: 15.0000 mL | Freq: Once | OROMUCOSAL | Status: AC

## 2021-08-11 MED ORDER — KETOROLAC TROMETHAMINE 15 MG/ML IJ SOLN
15.0000 mg | Freq: Four times a day (QID) | INTRAMUSCULAR | Status: AC
  Administered 2021-08-11: 15 mg via INTRAVENOUS
  Filled 2021-08-11: qty 1

## 2021-08-11 MED ORDER — PHENYLEPHRINE HCL (PRESSORS) 10 MG/ML IV SOLN
INTRAVENOUS | Status: DC | PRN
  Administered 2021-08-11 (×3): 80 ug via INTRAVENOUS

## 2021-08-11 MED ORDER — ATORVASTATIN CALCIUM 40 MG PO TABS
80.0000 mg | ORAL_TABLET | Freq: Every evening | ORAL | Status: DC
  Administered 2021-08-12 – 2021-08-13 (×2): 80 mg via ORAL
  Filled 2021-08-11 (×2): qty 2

## 2021-08-11 SURGICAL SUPPLY — 40 items
BAG HAMPER (MISCELLANEOUS) ×4 IMPLANT
COVER LIGHT HANDLE STERIS (MISCELLANEOUS) ×16 IMPLANT
DRSG OPSITE POSTOP 4X8 (GAUZE/BANDAGES/DRESSINGS) ×2 IMPLANT
ELECT REM PT RETURN 9FT ADLT (ELECTROSURGICAL) ×3
ELECTRODE REM PT RTRN 9FT ADLT (ELECTROSURGICAL) ×3 IMPLANT
GAUZE 4X4 16PLY ~~LOC~~+RFID DBL (SPONGE) ×2 IMPLANT
GLOVE BIO SURGEON STRL SZ 6.5 (GLOVE) ×6 IMPLANT
GLOVE BIO SURGEON STRL SZ7 (GLOVE) ×6 IMPLANT
GLOVE BIOGEL PI IND STRL 6.5 (GLOVE) ×4 IMPLANT
GLOVE BIOGEL PI IND STRL 7.0 (GLOVE) ×10 IMPLANT
GLOVE BIOGEL PI INDICATOR 6.5 (GLOVE) ×2
GLOVE BIOGEL PI INDICATOR 7.0 (GLOVE) ×6
GLOVE SURG SS PI 7.0 STRL IVOR (GLOVE) ×4 IMPLANT
GOWN STRL REUS W/TWL LRG LVL3 (GOWN DISPOSABLE) ×24 IMPLANT
INST SET MAJOR GENERAL (KITS) ×4 IMPLANT
KIT BLADEGUARD II DBL (SET/KITS/TRAYS/PACK) ×4 IMPLANT
KIT TURNOVER KIT A (KITS) ×4 IMPLANT
LIGASURE IMPACT 36 18CM CVD LR (INSTRUMENTS) ×4 IMPLANT
MANIFOLD NEPTUNE II (INSTRUMENTS) ×4 IMPLANT
NDL HYPO 21X1.5 SAFETY (NEEDLE) ×2 IMPLANT
NEEDLE HYPO 21X1.5 SAFETY (NEEDLE) ×3 IMPLANT
NS IRRIG 1000ML POUR BTL (IV SOLUTION) ×10 IMPLANT
NS IRRIG 500ML POUR BTL (IV SOLUTION) ×2 IMPLANT
PACK COLON (CUSTOM PROCEDURE TRAY) ×4 IMPLANT
PAD ARMBOARD 7.5X6 YLW CONV (MISCELLANEOUS) ×4 IMPLANT
PENCIL SMOKE EVACUATOR COATED (MISCELLANEOUS) ×4 IMPLANT
RELOAD PROXIMATE 75MM BLUE (ENDOMECHANICALS) ×15 IMPLANT
RELOAD STAPLE 75 3.8 BLU REG (ENDOMECHANICALS) IMPLANT
RETRACTOR WND ALEXIS-O 25 LRG (MISCELLANEOUS) ×1 IMPLANT
RTRCTR WOUND ALEXIS O 25CM LRG (MISCELLANEOUS) ×3
SPONGE T-LAP 18X18 ~~LOC~~+RFID (SPONGE) ×12 IMPLANT
STAPLER GUN LINEAR PROX 60 (STAPLE) ×6 IMPLANT
STAPLER PROXIMATE 75MM BLUE (STAPLE) ×2 IMPLANT
STAPLER VISISTAT (STAPLE) ×4 IMPLANT
SUT CHROMIC 3 0 SH 27 (SUTURE) ×4 IMPLANT
SUT PDS AB CT VIOLET #0 27IN (SUTURE) ×8 IMPLANT
SUT SILK 3 0 SH CR/8 (SUTURE) ×8 IMPLANT
SYR 20ML LL LF (SYRINGE) ×4 IMPLANT
TRAY FOLEY MTR SLVR 16FR STAT (SET/KITS/TRAYS/PACK) ×4 IMPLANT
YANKAUER SUCT BULB TIP 10FT TU (MISCELLANEOUS) ×4 IMPLANT

## 2021-08-11 NOTE — Transfer of Care (Signed)
Immediate Anesthesia Transfer of Care Note  Patient: Blake Rodriguez  Procedure(s) Performed: PARTIAL COLECTOMY, OPEN (Right: Abdomen) ADDITIONAL SMALL BOWEL RESECTION (Abdomen)  Patient Location: PACU  Anesthesia Type:General  Level of Consciousness: sedated  Airway & Oxygen Therapy: Patient Spontanous Breathing  Post-op Assessment: Report given to RN and Post -op Vital signs reviewed and stable  Post vital signs: Reviewed and stable  Last Vitals:  Vitals Value Taken Time  BP 167/78 08/11/21 0951  Temp    Pulse 71 08/11/21 0959  Resp 9 08/11/21 0959  SpO2 95 % 08/11/21 0959  Vitals shown include unvalidated device data.  Last Pain:  Vitals:   08/11/21 0731  PainSc: 0-No pain      Patients Stated Pain Goal: 7 (24/49/75 3005)  Complications: No notable events documented.

## 2021-08-11 NOTE — Progress Notes (Signed)
[name]  Updated family. Right hemicolectomy and SBR and drain removed.   Sips and chips today, can have the ensure post surgery drink too.  ERAS for pain IS, OOB Labs tomorrow LR @ 50 SCDs, heparin sq Shirline Frees, MD [facility] [place], [place] [id number] [phone] (office)

## 2021-08-11 NOTE — Op Note (Addendum)
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 Constance Haw, MD   Assistants: No qualified resident was available    Anesthesia: General endotracheal   Anesthesiologist: Louann Sjogren, MD    Specimens:  En bloc right colon, additional loop of small bowel, phlegmon and IR drain suture in phlegmon    Estimated Blood Loss: Minimal   Blood Replacement: None    Complications: None   Wound Class: Contaminated    Operative Indications: Blake Rodriguez is a sweet 71 yo who has appendiceal adenocarcinoma found after his appendix was removed during an interval appendectomy for perforated appendicitis. The pathology came back with adenocarcinoma and we discussed need for formal right hemicolectomy to ensure no additional cancer and to assess the lymph nodes. We discussed risk of bleeding, infection, anastomotic leak, need for further treatment. He also had to have an IR drain placed after the appendectomy for a re-accumulation of a abscess and this was left in place until surgery given the location.   Findings: Right colon and phlegmon with additional loop of proximal small bowel adherent to the area    Procedure: The patient was taken to the operating room and placed supine. General endotracheal anesthesia was induced. Intravenous antibiotics were administered per protocol.  An orogastric tube positioned to decompress the stomach. The abdomen was prepared and draped in the usual sterile fashion.  The IR drain was cut and prepped into the field, and additional betadine was used on the drain and drain site.   A midline incisions was made and carried down to the fascia. With care the peritoneal cavity was entered with scissors. There were adhesions  from the IR drain in the lower abdomen and these were taken down bluntly.  A wound protector was placed. The right colon and phlegmon were scarred into the right lower part of the gutter/ pelvic brim, and the IR drain was removed to aid with mobilization but the sutures in the drain would not pull through.  This was left in place.  With care the small bowel was ran and packed into the left upper quadrant. A proximal loop of jejunum was adherent to the phlegmon. The scar to the pelvic brim /right gutter was taken down carefully with cautery. The white line was also taken down with cautery and the hepatic flexure to help mobilize the right colon.    From here I could elevate the right colon and terminal ileum out of the abdomen and there was epiploic fat from the sigmoid adherent and this was ligated with the ligasure, ensuring the sigmoid was protected. The fat was caseating from the inflammatory process.   The right ureter was identified and protected and the right colon was swept up into the field.  The small bowel loop that was proximal had to be taken with the right colon en bloc. The area was toweled off. A 75 mm linear cutting stapler was used to take the loop of small bowel (10 cm total) that was adherent in the standard fashion. The mesentery was taken with the ligasure. The terminal ileum was taken with a 75 mm linear cutting stapler and the distal point of transection on the right colon was taken wit ha 75 mm linear cutting stapler. The mesentery was taken with the ligasure,  ensuring adequate lymph node harvest and protecting the ureter.    The right colon with terminal ileum, phlegmon with IR drain suture, and additional loop of small bowel were taken en bloc.  New towels were laid out, the small bowel anastomosis was performed in the standard side to side fashion using a 75 mm linear cutting stapler and a TA 60. The staple line was oversewn with lembert silk 3-0 suture and two 3-0 silk crotch  sutures were placed. The mesentery was closed with 3-0 chromic gut.  The distal small bowel and colon anastomosis was performed in the standard fashion, side to side antimesenteric using a 75 mm linear cutting stapler and a TA 60. The staple line was oversewn with lembert 3-0 silk suture and two 3-0 silk crotch sutures were placed. This mesentery was also closed with 3-0 chromic gut and all the bowel was in appropriate anatomic alignment with no twisting. The abdomen was irrigated. Hemostasis was confirmed. The sigmoid colon was inspected and the epiploic fat was oversewn with 3-0 Silk suture due to some bleeding. The omentum was placed over the bowel.    The entire team changed gown and gloves and new equipment was used for closing and new drapes placed. The abdomen was closed in the standard fashion with 0 PDS suture. Exparel was injected. The skin was irrigated and closed with staples. A honeycomb dressing was placed over the staples and the prior IR drain entry site. The foley was removed.   Final inspection revealed acceptable hemostasis. All counts were correct at the end of the case. The patient was awakened from anesthesia and extubated without complication.  The patient went to the PACU in stable condition.   Curlene Labrum, MD [facility] [address] [place], [place] [address] [phone] (office)

## 2021-08-11 NOTE — Interval H&P Note (Signed)
History and Physical Interval Note:  08/11/2021 7:26 AM  Blake Rodriguez  has presented today for surgery, with the diagnosis of APPENDICEAL ADENOCARCINOMA.  The various methods of treatment have been discussed with the patient and family. After consideration of risks, benefits and other options for treatment, the patient has consented to  Procedure(s): PARTIAL COLECTOMY, OPEN (Right) as a surgical intervention.  The patient's history has been reviewed, patient examined, no change in status, stable for surgery.  I have reviewed the patient's chart and labs.  Questions were answered to the patient's satisfaction.    Prep completed  [name]

## 2021-08-11 NOTE — Anesthesia Preprocedure Evaluation (Signed)
Anesthesia Evaluation  Patient identified by MRN, date of birth, ID band Patient awake    Reviewed: Allergy & Precautions, H&P , NPO status , Patient's Chart, lab work & pertinent test results, reviewed documented beta blocker date and time   Airway Mallampati: II  TM Distance: >3 FB Neck ROM: full    Dental no notable dental hx.    Pulmonary neg pulmonary ROS, former smoker,    Pulmonary exam normal breath sounds clear to auscultation       Cardiovascular Exercise Tolerance: Good hypertension, negative cardio ROS   Rhythm:regular Rate:Normal     Neuro/Psych negative neurological ROS  negative psych ROS   GI/Hepatic negative GI ROS, Neg liver ROS,   Endo/Other  negative endocrine ROSdiabetes, Type 2  Renal/GU CRFRenal disease  negative genitourinary   Musculoskeletal   Abdominal   Peds  Hematology negative hematology ROS (+)   Anesthesia Other Findings   Reproductive/Obstetrics negative OB ROS                             Anesthesia Physical Anesthesia Plan  ASA: 2  Anesthesia Plan: General and General ETT   Post-op Pain Management:    Induction:   PONV Risk Score and Plan: Ondansetron  Airway Management Planned:   Additional Equipment:   Intra-op Plan:   Post-operative Plan:   Informed Consent: I have reviewed the patients History and Physical, chart, labs and discussed the procedure including the risks, benefits and alternatives for the proposed anesthesia with the patient or authorized representative who has indicated his/her understanding and acceptance.     Dental Advisory Given  Plan Discussed with: CRNA  Anesthesia Plan Comments:         Anesthesia Quick Evaluation

## 2021-08-11 NOTE — Anesthesia Procedure Notes (Addendum)
Procedure Name: Intubation Date/Time: 08/11/2021 7:43 AM  Performed by: Minerva Ends, CRNAPre-anesthesia Checklist: Patient identified, Emergency Drugs available, Suction available and Patient being monitored Patient Re-evaluated:Patient Re-evaluated prior to induction Oxygen Delivery Method: Circle system utilized Preoxygenation: Pre-oxygenation with 100% oxygen Induction Type: IV induction Ventilation: Mask ventilation without difficulty Laryngoscope Size: Mac and 3 Grade View: Grade I Tube type: Oral Tube size: 7.0 mm Number of attempts: 1 Airway Equipment and Method: Stylet and Oral airway Placement Confirmation: ETT inserted through vocal cords under direct vision, positive ETCO2 and breath sounds checked- equal and bilateral Secured at: 23 cm Tube secured with: Tape Dental Injury: Teeth and Oropharynx as per pre-operative assessment

## 2021-08-11 NOTE — Anesthesia Postprocedure Evaluation (Signed)
Anesthesia Post Note  Patient: Blake Rodriguez  Procedure(s) Performed: EN BLOC RESECTION, RIGHT HEMICOLECTOMY, ADDITIONAL SMALL BOWEL RESECTION (Abdomen)  Patient location during evaluation: Phase II Anesthesia Type: General Level of consciousness: awake Pain management: pain level controlled Vital Signs Assessment: post-procedure vital signs reviewed and stable Respiratory status: spontaneous breathing and respiratory function stable Cardiovascular status: blood pressure returned to baseline and stable Postop Assessment: no headache and no apparent nausea or vomiting Anesthetic complications: no Comments: Late entry   No notable events documented.   Last Vitals:  Vitals:   08/11/21 1030 08/11/21 1131  BP: (!) 161/70 (!) 145/66  Pulse: 66 73  Resp: 12 18  Temp:  36.4 C  SpO2: 97% 97%    Last Pain:  Vitals:   08/11/21 1145  TempSrc:   PainSc: 10-Worst pain ever                 Louann Sjogren

## 2021-08-12 ENCOUNTER — Encounter (HOSPITAL_COMMUNITY): Payer: Self-pay | Admitting: General Surgery

## 2021-08-12 LAB — CBC
HCT: 28.9 % — ABNORMAL LOW (ref 39.0–52.0)
Hemoglobin: 8.9 g/dL — ABNORMAL LOW (ref 13.0–17.0)
MCH: 29.2 pg (ref 26.0–34.0)
MCHC: 30.8 g/dL (ref 30.0–36.0)
MCV: 94.8 fL (ref 80.0–100.0)
Platelets: 352 10*3/uL (ref 150–400)
RBC: 3.05 MIL/uL — ABNORMAL LOW (ref 4.22–5.81)
RDW: 16.5 % — ABNORMAL HIGH (ref 11.5–15.5)
WBC: 11.8 10*3/uL — ABNORMAL HIGH (ref 4.0–10.5)
nRBC: 0 % (ref 0.0–0.2)

## 2021-08-12 LAB — BASIC METABOLIC PANEL
Anion gap: 13 (ref 5–15)
BUN: 22 mg/dL (ref 8–23)
CO2: 19 mmol/L — ABNORMAL LOW (ref 22–32)
Calcium: 8.4 mg/dL — ABNORMAL LOW (ref 8.9–10.3)
Chloride: 107 mmol/L (ref 98–111)
Creatinine, Ser: 1.84 mg/dL — ABNORMAL HIGH (ref 0.61–1.24)
GFR, Estimated: 39 mL/min — ABNORMAL LOW (ref >60)
Glucose, Bld: 176 mg/dL — ABNORMAL HIGH (ref 70–99)
Potassium: 3.8 mmol/L (ref 3.5–5.1)
Sodium: 139 mmol/L (ref 135–145)

## 2021-08-12 LAB — MAGNESIUM: Magnesium: 1.7 mg/dL (ref 1.7–2.4)

## 2021-08-12 LAB — PHOSPHORUS: Phosphorus: 5.7 mg/dL — ABNORMAL HIGH (ref 2.5–4.6)

## 2021-08-12 MED ORDER — MAGNESIUM SULFATE 2 GM/50ML IV SOLN
2.0000 g | Freq: Once | INTRAVENOUS | Status: AC
  Administered 2021-08-12: 2 g via INTRAVENOUS
  Filled 2021-08-12: qty 50

## 2021-08-12 MED ORDER — DOCUSATE SODIUM 100 MG PO CAPS
100.0000 mg | ORAL_CAPSULE | Freq: Two times a day (BID) | ORAL | Status: DC
  Administered 2021-08-12 – 2021-08-14 (×5): 100 mg via ORAL
  Filled 2021-08-12 (×5): qty 1

## 2021-08-12 MED ORDER — LINAGLIPTIN 5 MG PO TABS
5.0000 mg | ORAL_TABLET | Freq: Every day | ORAL | Status: DC
  Administered 2021-08-12 – 2021-08-14 (×3): 5 mg via ORAL
  Filled 2021-08-12 (×3): qty 1

## 2021-08-12 NOTE — Progress Notes (Signed)
Patient slept throughout the night. Minimal c/o pain relieved by Tylenol. Patient voided 500cc without difficulty.

## 2021-08-12 NOTE — Discharge Instructions (Signed)
Discharge Open Abdominal Surgery Instructions:  Common Complaints: Pain at the incision site is common. This will improve with time. Take your pain medications as described below. Some nausea is common and poor appetite. The main goal is to stay hydrated the first few days after surgery.   Diet/ Activity: Diet as tolerated. You have started and tolerated a diet in the hospital, and should continue to increase what you are able to eat.   You may not have a large appetite, but it is important to stay hydrated. Drink 64 ounces of water a day. Your appetite will return with time.  Keep a dry dressing in place over your staples daily or as needed. Some minor pink/ blood tinged drainage is expected. This will stop in a few days after surgery.  Shower per your regular routine daily.  Do not take hot showers. Take warm showers that are less than 10 minutes. Pat the incision dry. Wear an abdominal binder daily with activity. You do not have to wear this while sleeping or sitting.  Rest and listen to your body, but do not remain in bed all day.  Walk everyday for at least 15-20 minutes. Deep cough and move around every 1-2 hours in the first few days after surgery.  Do not lift > 10 lbs, perform excessive bending, pushing, pulling, squatting for 6-8 weeks after surgery.  The activity restrictions and the abdominal binder are to prevent hernia formation at your incision while you are healing.  Do not place lotions or balms on your incision unless instructed to specifically by Dr. Yasin Ducat.   Pain Expectations and Narcotics: -After surgery you will have pain associated with your incisions and this is normal. The pain is muscular and nerve pain, and will get better with time. -You are encouraged and expected to take non narcotic medications like tylenol and ibuprofen (when able) to treat pain as multiple modalities can aid with pain treatment. -Narcotics are only used when pain is severe or there is  breakthrough pain. -You are not expected to have a pain score of 0 after surgery, as we cannot prevent pain. A pain score of 3-4 that allows you to be functional, move, walk, and tolerate some activity is the goal. The pain will continue to improve over the days after surgery and is dependent on your surgery. -Due to [place] law, we are only able to give a certain amount of pain medication to treat post operative pain, and we only give additional narcotics on a patient by patient basis.  -For most laparoscopic surgery, studies have shown that the majority of patients only need 10-15 narcotic pills, and for open surgeries most patients only need 15-20.   -Having appropriate expectations of pain and knowledge of pain management with non narcotics is important as we do not want anyone to become addicted to narcotic pain medication.  -Using ice packs in the first 48 hours and heating pads after 48 hours, wearing an abdominal binder (when recommended), and using over the counter medications are all ways to help with pain management.   -Simple acts like meditation and mindfulness practices after surgery can also help with pain control and research has proven the benefit of these practices.  Medication: Take tylenol and ibuprofen as needed for pain control, alternating every 4-6 hours.  Example:  Tylenol 1000mg @ 6am, 12noon, 6pm, 12midnight (Do not exceed 4000mg of tylenol a day). Ibuprofen 800mg @ 9am, 3pm, 9pm, 3am (Do not exceed 3600mg of ibuprofen a day).    Take Roxicodone for breakthrough pain every 4 hours.  Take Colace for constipation related to narcotic pain medication. If you do not have a bowel movement in 2 days, take Miralax over the counter.  Drink plenty of water to also prevent constipation.   Contact Information: If you have questions or concerns, please call our office, [phone], Monday- Thursday 8AM-5PM and Friday 8AM-12Noon.  If it is after hours or on the weekend, please call [facility]  Main Number, [phone], [phone], and ask to speak to the surgeon on call for Dr. Walt Geathers at [place].   

## 2021-08-12 NOTE — Progress Notes (Signed)
Had very small brown bm in toilet.  Intake about half of supper tray.

## 2021-08-12 NOTE — Progress Notes (Signed)
Drinking liquids today and had maybe half of lunch tray and some boost.  No c/o nausea or pain. Family was at bedside earlier.

## 2021-08-12 NOTE — Progress Notes (Signed)
Drank one and half cranberry juice earlier with pills.  No nausea.  Passing gas.  Assisted up to chair.

## 2021-08-12 NOTE — Progress Notes (Signed)
[name] Progress Note  1 Day Post-Op  Subjective: Doing well. No pain. Having flatus. No nausea.   Objective: Vital signs in last 24 hours: Temp:  [97.6 F (36.4 C)-98.6 F (37 C)] 98.4 F (36.9 C) (06/13 0838) Pulse Rate:  [66-90] 84 (06/13 0838) Resp:  [8-18] 17 (06/13 0838) BP: (129-183)/(65-81) 150/78 (06/13 0838) SpO2:  [94 %-100 %] 96 % (06/13 0838) Weight:  [65.9 kg-68 kg] 65.9 kg (06/13 0357) Last BM Date : 08/10/21  Intake/Output from previous day: 06/12 0701 - 06/13 0700 In: 2678.4 [I.V.:2592.9; IV Piggyback:85.5] Out: 555 [Urine:550; Blood:5] Intake/Output this shift: No intake/output data recorded.  General appearance: alert and no distress GI: soft, mildly distended, honeycomb in place, no erythema or drainage  Lab Results:  Recent Labs    08/12/21 0424  WBC 11.8*  HGB 8.9*  HCT 28.9*  PLT 352   BMET Recent Labs    08/12/21 0424  NA 139  K 3.8  CL 107  CO2 19*  GLUCOSE 176*  BUN 22  CREATININE 1.84*  CALCIUM 8.4*   PT/INR No results for input(s): "LABPROT", "INR" in the last 72 hours.  Studies/Results: No results found.  Anti-infectives: Anti-infectives (From admission, onward)    Start     Dose/Rate Route Frequency Ordered Stop   08/11/21 1800  piperacillin-tazobactam (ZOSYN) IVPB 3.375 g        3.375 g 12.5 mL/hr over 240 Minutes Intravenous Every 8 hours 08/11/21 1128 08/13/21 1759   08/11/21 0645  cefoTEtan (CEFOTAN) 2 g in sodium chloride 0.9 % 100 mL IVPB        2 g 200 mL/hr over 30 Minutes Intravenous On call to O.R. 08/11/21 0631 08/11/21 0830       Assessment/Plan: Patient s/p En bloc resection right hemicolectomy and SBR for adenocarcinoma of the appendix. Doing well.  PRN For pain IS, OOB HD ok, home meds  Clear diet, go slow, colaced added Labs tomorrow, Mg replaced, LR @ 50 H&H drifting but no signs of bleeding, monitor Zosyn for 48 hours post op given the phlegmon  SCDs, heparin sq    Dr. Arnoldo Morale seeing patient tomorrow.      LOS: 1 day    [name] 08/12/2021

## 2021-08-12 NOTE — Progress Notes (Signed)
  Transition of Care [facility]) Screening Note   Patient Details  Name: Blake Rodriguez Date of Birth: 1950-03-28   Transition of [facility]) CM/SW Contact:    Iona Beard, [place] Phone Number: 08/12/2021, 10:44 AM  [facility] notified of pts hospital admission. [facility] ID is [id number].   Transition of Care Department [facility]) has reviewed patient and no TOC needs have been identified at this time. We will continue to monitor patient advancement through interdisciplinary progression rounds. If new patient transition needs arise, please place a TOC consult.

## 2021-08-13 LAB — CBC WITH DIFFERENTIAL/PLATELET
Abs Immature Granulocytes: 0.02 10*3/uL (ref 0.00–0.07)
Basophils Absolute: 0 10*3/uL (ref 0.0–0.1)
Basophils Relative: 0 %
Eosinophils Absolute: 0.1 10*3/uL (ref 0.0–0.5)
Eosinophils Relative: 1 %
HCT: 24.6 % — ABNORMAL LOW (ref 39.0–52.0)
Hemoglobin: 7.6 g/dL — ABNORMAL LOW (ref 13.0–17.0)
Immature Granulocytes: 0 %
Lymphocytes Relative: 16 %
Lymphs Abs: 1.7 10*3/uL (ref 0.7–4.0)
MCH: 29.1 pg (ref 26.0–34.0)
MCHC: 30.9 g/dL (ref 30.0–36.0)
MCV: 94.3 fL (ref 80.0–100.0)
Monocytes Absolute: 0.9 10*3/uL (ref 0.1–1.0)
Monocytes Relative: 8 %
Neutro Abs: 7.9 10*3/uL — ABNORMAL HIGH (ref 1.7–7.7)
Neutrophils Relative %: 75 %
Platelets: 304 10*3/uL (ref 150–400)
RBC: 2.61 MIL/uL — ABNORMAL LOW (ref 4.22–5.81)
RDW: 16.6 % — ABNORMAL HIGH (ref 11.5–15.5)
WBC: 10.6 10*3/uL — ABNORMAL HIGH (ref 4.0–10.5)
nRBC: 0 % (ref 0.0–0.2)

## 2021-08-13 LAB — HEMOGLOBIN AND HEMATOCRIT, BLOOD
HCT: 31.8 % — ABNORMAL LOW (ref 39.0–52.0)
Hemoglobin: 9.9 g/dL — ABNORMAL LOW (ref 13.0–17.0)

## 2021-08-13 LAB — MAGNESIUM: Magnesium: 2.3 mg/dL (ref 1.7–2.4)

## 2021-08-13 LAB — BASIC METABOLIC PANEL
Anion gap: 7 (ref 5–15)
BUN: 22 mg/dL (ref 8–23)
CO2: 23 mmol/L (ref 22–32)
Calcium: 8.2 mg/dL — ABNORMAL LOW (ref 8.9–10.3)
Chloride: 108 mmol/L (ref 98–111)
Creatinine, Ser: 1.71 mg/dL — ABNORMAL HIGH (ref 0.61–1.24)
GFR, Estimated: 43 mL/min — ABNORMAL LOW (ref >60)
Glucose, Bld: 166 mg/dL — ABNORMAL HIGH (ref 70–99)
Potassium: 3.2 mmol/L — ABNORMAL LOW (ref 3.5–5.1)
Sodium: 138 mmol/L (ref 135–145)

## 2021-08-13 LAB — GLUCOSE, CAPILLARY
Glucose-Capillary: 172 mg/dL — ABNORMAL HIGH (ref 70–99)
Glucose-Capillary: 166 mg/dL — ABNORMAL HIGH (ref 70–99)

## 2021-08-13 LAB — PHOSPHORUS: Phosphorus: 2.4 mg/dL — ABNORMAL LOW (ref 2.5–4.6)

## 2021-08-13 LAB — SURGICAL PATHOLOGY

## 2021-08-13 MED ORDER — INSULIN ASPART 100 UNIT/ML IJ SOLN
0.0000 [IU] | Freq: Three times a day (TID) | INTRAMUSCULAR | Status: DC
  Administered 2021-08-13: 2 [IU] via SUBCUTANEOUS
  Administered 2021-08-13: 3 [IU] via SUBCUTANEOUS
  Administered 2021-08-14: 2 [IU] via SUBCUTANEOUS
  Administered 2021-08-14: 3 [IU] via SUBCUTANEOUS

## 2021-08-13 MED ORDER — METFORMIN HCL ER 500 MG PO TB24
500.0000 mg | ORAL_TABLET | Freq: Two times a day (BID) | ORAL | Status: DC
  Administered 2021-08-13 – 2021-08-14 (×2): 500 mg via ORAL
  Filled 2021-08-13 (×2): qty 1

## 2021-08-13 MED ORDER — K PHOS MONO-SOD PHOS DI & MONO 155-852-130 MG PO TABS
500.0000 mg | ORAL_TABLET | Freq: Two times a day (BID) | ORAL | Status: DC
  Administered 2021-08-13 – 2021-08-14 (×3): 500 mg via ORAL
  Filled 2021-08-13 (×3): qty 2

## 2021-08-13 MED ORDER — SODIUM CHLORIDE 0.9% IV SOLUTION: Freq: Once | INTRAVENOUS | Status: AC

## 2021-08-13 NOTE — Progress Notes (Signed)
Pt has slept throughout the night with no complaints of pain or concerns voiced.

## 2021-08-13 NOTE — Progress Notes (Signed)
2 Days Post-Op  Subjective: Patient had bowel movement this morning.  Minimal incisional pain.  Tolerating clear liquid diet well.  Objective: Vital signs in last 24 hours: Temp:  [98.2 F (36.8 C)-98.6 F (37 C)] 98.6 F (37 C) (06/14 0519) Pulse Rate:  [79-85] 85 (06/14 0519) Resp:  [16-20] 16 (06/14 0519) BP: (115-150)/(60-97) 116/60 (06/14 0519) SpO2:  [94 %-98 %] 97 % (06/14 0519) Weight:  [66.6 kg] 66.6 kg (06/14 0519) Last BM Date : 08/12/21  Intake/Output from previous day: 06/13 0701 - 06/14 0700 In: 1066.9 [P.O.:420; I.V.:499.3; IV Piggyback:147.6] Out: -  Intake/Output this shift: No intake/output data recorded.  General appearance: alert, cooperative, and no distress Resp: clear to auscultation bilaterally Cardio: regular rate and rhythm, S1, S2 normal, no murmur, click, rub or gallop GI: Soft, bowel sounds present.  Incision healing well.  Lab Results:  Recent Labs    08/12/21 0424 08/13/21 0406  WBC 11.8* 10.6*  HGB 8.9* 7.6*  HCT 28.9* 24.6*  PLT 352 304   BMET Recent Labs    08/12/21 0424 08/13/21 0406  NA 139 138  K 3.8 3.2*  CL 107 108  CO2 19* 23  GLUCOSE 176* 166*  BUN 22 22  CREATININE 1.84* 1.71*  CALCIUM 8.4* 8.2*   PT/INR No results for input(s): "LABPROT", "INR" in the last 72 hours.  Studies/Results: No results found.  Anti-infectives: Anti-infectives (From admission, onward)    Start     Dose/Rate Route Frequency Ordered Stop   08/11/21 1800  piperacillin-tazobactam (ZOSYN) IVPB 3.375 g        3.375 g 12.5 mL/hr over 240 Minutes Intravenous Every 8 hours 08/11/21 1128 08/13/21 1759   08/11/21 0645  cefoTEtan (CEFOTAN) 2 g in sodium chloride 0.9 % 100 mL IVPB        2 g 200 mL/hr over 30 Minutes Intravenous On call to O.R. 08/11/21 0631 08/11/21 0830       Assessment/Plan: s/p Procedure(s): EN BLOC RESECTION, RIGHT HEMICOLECTOMY, ADDITIONAL SMALL BOWEL RESECTION Impression: Bowel function has returned on  postoperative day 2.  Patient's hemoglobin has drifted down to 7.6.  He still has mild renal insufficiency.  Blood pressure is stable.  After discussion with the patient, will give the patient 1 unit of packed red blood cells.  I have stopped the heparin subcu.  Entereg has been stopped.  Will advance to carb modified diet.  Will supplement potassium and phosphorus.  LOS: 2 days    Aviva Signs 08/13/2021

## 2021-08-14 LAB — HEMOGLOBIN AND HEMATOCRIT, BLOOD
HCT: 27.9 % — ABNORMAL LOW (ref 39.0–52.0)
Hemoglobin: 8.7 g/dL — ABNORMAL LOW (ref 13.0–17.0)

## 2021-08-14 LAB — CBC WITH DIFFERENTIAL/PLATELET
Abs Immature Granulocytes: 0.06 10*3/uL (ref 0.00–0.07)
Basophils Absolute: 0 10*3/uL (ref 0.0–0.1)
Basophils Relative: 0 %
Eosinophils Absolute: 0.2 10*3/uL (ref 0.0–0.5)
Eosinophils Relative: 2 %
HCT: 25.7 % — ABNORMAL LOW (ref 39.0–52.0)
Hemoglobin: 7.8 g/dL — ABNORMAL LOW (ref 13.0–17.0)
Immature Granulocytes: 1 %
Lymphocytes Relative: 17 %
Lymphs Abs: 1.8 10*3/uL (ref 0.7–4.0)
MCH: 29.1 pg (ref 26.0–34.0)
MCHC: 30.4 g/dL (ref 30.0–36.0)
MCV: 95.9 fL (ref 80.0–100.0)
Monocytes Absolute: 0.7 10*3/uL (ref 0.1–1.0)
Monocytes Relative: 7 %
Neutro Abs: 7.4 10*3/uL (ref 1.7–7.7)
Neutrophils Relative %: 73 %
Platelets: 280 10*3/uL (ref 150–400)
RBC: 2.68 MIL/uL — ABNORMAL LOW (ref 4.22–5.81)
RDW: 16.1 % — ABNORMAL HIGH (ref 11.5–15.5)
WBC: 10.2 10*3/uL (ref 4.0–10.5)
nRBC: 0 % (ref 0.0–0.2)

## 2021-08-14 LAB — MAGNESIUM: Magnesium: 1.9 mg/dL (ref 1.7–2.4)

## 2021-08-14 LAB — BASIC METABOLIC PANEL WITH GFR
Anion gap: 7 (ref 5–15)
BUN: 14 mg/dL (ref 8–23)
CO2: 24 mmol/L (ref 22–32)
Calcium: 7.9 mg/dL — ABNORMAL LOW (ref 8.9–10.3)
Chloride: 112 mmol/L — ABNORMAL HIGH (ref 98–111)
Creatinine, Ser: 1.26 mg/dL — ABNORMAL HIGH (ref 0.61–1.24)
GFR, Estimated: >60 mL/min
Glucose, Bld: 125 mg/dL — ABNORMAL HIGH (ref 70–99)
Potassium: 3.2 mmol/L — ABNORMAL LOW (ref 3.5–5.1)
Sodium: 143 mmol/L (ref 135–145)

## 2021-08-14 LAB — GLUCOSE, CAPILLARY
Glucose-Capillary: 127 mg/dL — ABNORMAL HIGH (ref 70–99)
Glucose-Capillary: 162 mg/dL — ABNORMAL HIGH (ref 70–99)

## 2021-08-14 LAB — PHOSPHORUS: Phosphorus: 2.5 mg/dL (ref 2.5–4.6)

## 2021-08-14 MED ORDER — OXYCODONE HCL 5 MG PO TABS: 5.0000 mg | ORAL_TABLET | ORAL | 0 refills | Status: DC | PRN

## 2021-08-14 NOTE — Discharge Summary (Signed)
Physician Discharge Summary  Patient ID: Blake Rodriguez MRN: [id number] DOB/AGE: 1950-11-15 71 y.o.  Admit date: 08/11/2021 Discharge date: 08/14/2021  Admission Diagnoses: Primary appendiceal carcinoma  Discharge Diagnoses: Same Principal Problem:   Primary appendiceal adenocarcinoma ([place]) Anemia secondary to acute blood loss, diabetes mellitus, hypertension, hypophosphatemia, hypokalemia  Discharged Condition: good  Hospital Course: Patient is a 71 year old white male who was recently diagnosed with goblet cell adenocarcinoma of the appendix after undergoing an Verl appendectomy for perforated appendicitis who on 08/11/2021 underwent a right hemicolectomy with en bloc resection of small bowel by Dr. Curlene Labrum.  He tolerated the surgery well.  His postoperative course was remarkable for mild anemia secondary to acute surgical blood loss.  He did receive 1 unit of packed red blood cells.  He also had mild hypophosphatemia and hypokalemia which were addressed.  Final pathology revealed no residual malignancy.  Patient's bowel function returned without difficulty.  He is being discharged home on 08/14/2021 in good and improving condition.   Treatments: surgery: Right hemicolectomy on 08/11/2021  Discharge Exam: Blood pressure (!) 157/73, pulse 87, temperature 98.3 F (36.8 C), resp. rate 17, height '5\' 9"'$  (1.753 m), weight 66.5 kg, SpO2 96 %. General appearance: alert, cooperative, and no distress Resp: clear to auscultation bilaterally Cardio: regular rate and rhythm, S1, S2 normal, no murmur, click, rub or gallop GI: Soft, incision healing well.  Bowel sounds active.  Disposition: Discharge disposition: 01-Home or Self Care       Discharge Instructions     Diet - low sodium heart healthy   Complete by: As directed    Increase activity slowly   Complete by: As directed       Allergies as of 08/14/2021   No Known Allergies      Medication List     TAKE these  medications    acetaminophen 325 MG tablet Commonly known as: TYLENOL Take 2 tablets (650 mg total) by mouth every 6 (six) hours as needed for mild pain, fever or headache.   Alogliptin Benzoate 12.5 MG Tabs Take 12.5 mg by mouth in the morning.   aspirin EC 81 MG tablet Take 1 tablet (81 mg total) by mouth daily with breakfast.   atorvastatin 80 MG tablet Commonly known as: LIPITOR Take 80 mg by mouth every evening.   empagliflozin 25 MG Tabs tablet Commonly known as: JARDIANCE Take 25 mg by mouth in the morning.   escitalopram 10 MG tablet Commonly known as: LEXAPRO Take 10 mg by mouth at bedtime.   ezetimibe 10 MG tablet Commonly known as: ZETIA Take 10 mg by mouth in the morning.   metFORMIN 500 MG 24 hr tablet Commonly known as: GLUCOPHAGE-XR Take 500 mg by mouth every evening.   metoprolol tartrate 25 MG tablet Commonly known as: LOPRESSOR Take 1 tablet (25 mg total) by mouth 2 (two) times daily.   NIFEdipine 90 MG 24 hr tablet Commonly known as: PROCARDIA XL/NIFEDICAL-XL Take 1 tablet (90 mg total) by mouth in the morning.   ondansetron 4 MG tablet Commonly known as: Zofran Take 1 tablet (4 mg total) by mouth every 8 (eight) hours as needed.   oxyCODONE 5 MG immediate release tablet Commonly known as: Roxicodone Take 1 tablet (5 mg total) by mouth every 4 (four) hours as needed for breakthrough pain or severe pain.   VITAMIN D3 PO Take 2 capsules by mouth daily.        Follow-up Information     [name],  MD Follow up on 08/27/2021.   Specialty: General Surgery Why: staple removal Contact information: [address] [place] [facility] [address] [phone]                 Signed: Aviva Signs 08/14/2021, 3:35 PM

## 2021-08-14 NOTE — Progress Notes (Signed)
Patient states understanding of discharge instructions.  

## 2021-08-14 NOTE — Progress Notes (Signed)
Pt had 3 bowel movements this shift and still continues to express concern that the blood in stool is abnormal but is darker with each episode. Reassurance offered. There have been no c/o pain, and pt continues to ambulate to the bathroom with assistance and pull himself up in the bed.

## 2021-08-14 NOTE — Progress Notes (Signed)
3 Days Post-Op  Subjective: Has been having dark stools which are now firming up.  Denies any nausea or vomiting.  No complaints.  Objective: Vital signs in last 24 hours: Temp:  [97.6 F (36.4 C)-98.1 F (36.7 C)] 98.1 F (36.7 C) (06/15 0423) Pulse Rate:  [84-89] 89 (06/15 0423) Resp:  [18] 18 (06/15 0423) BP: (125-145)/(63-87) 125/87 (06/15 0423) SpO2:  [96 %-100 %] 96 % (06/15 0423) Weight:  [66.5 kg] 66.5 kg (06/15 0500) Last BM Date : 08/14/21  Intake/Output from previous day: 06/14 0701 - 06/15 0700 In: 2399.3 [P.O.:1060; I.V.:983.3; Blood:356] Out: 3 [Stool:3] Intake/Output this shift: No intake/output data recorded.  General appearance: alert, cooperative, and no distress Resp: clear to auscultation bilaterally Cardio: regular rate and rhythm, S1, S2 normal, no murmur, click, rub or gallop GI: Soft, bowel sounds active.  Incision healing well.  Lab Results:  Recent Labs    08/13/21 0406 08/13/21 1327 08/14/21 0426  WBC 10.6*  --  10.2  HGB 7.6* 9.9* 7.8*  HCT 24.6* 31.8* 25.7*  PLT 304  --  280   BMET Recent Labs    08/13/21 0406 08/14/21 0426  NA 138 143  K 3.2* 3.2*  CL 108 112*  CO2 23 24  GLUCOSE 166* 125*  BUN 22 14  CREATININE 1.71* 1.26*  CALCIUM 8.2* 7.9*   PT/INR No results for input(s): "LABPROT", "INR" in the last 72 hours.  Studies/Results: No results found.  Anti-infectives: Anti-infectives (From admission, onward)    Start     Dose/Rate Route Frequency Ordered Stop   08/11/21 1800  piperacillin-tazobactam (ZOSYN) IVPB 3.375 g        3.375 g 12.5 mL/hr over 240 Minutes Intravenous Every 8 hours 08/11/21 1128 08/13/21 1245   08/11/21 0645  cefoTEtan (CEFOTAN) 2 g in sodium chloride 0.9 % 100 mL IVPB        2 g 200 mL/hr over 30 Minutes Intravenous On call to O.R. 08/11/21 0631 08/11/21 0830       Assessment/Plan: s/p Procedure(s): EN BLOC RESECTION, RIGHT HEMICOLECTOMY, ADDITIONAL SMALL BOWEL RESECTION Impression:  Stable on postoperative day 3.  Patient's hemoglobin has equalized this morning.  This was after 1 unit of packed red blood cells.  I suspect he had some bleeding from his anastomosis which is resolving.  Hypophosphatemia resolved.  Final pathology reveals no residual malignancy.  Will check hemoglobin later on today.  Anticipate discharge in the next 24 to 48 hours.  LOS: 3 days    Aviva Signs 08/14/2021

## 2021-08-16 ENCOUNTER — Inpatient Hospital Stay (HOSPITAL_COMMUNITY)
Admission: EM | Admit: 2021-08-16 | Discharge: 2021-08-18 | DRG: 920 | Disposition: A | Discharge status: Home / Self Care | Payer: No Typology Code available for payment source | Attending: General Surgery | Admitting: General Surgery

## 2021-08-16 ENCOUNTER — Other Ambulatory Visit: Payer: Self-pay

## 2021-08-16 ENCOUNTER — Encounter (HOSPITAL_COMMUNITY): Payer: Self-pay

## 2021-08-16 DIAGNOSIS — I10 Essential (primary) hypertension: Secondary | ICD-10-CM | POA: Diagnosis present

## 2021-08-16 DIAGNOSIS — M1990 Unspecified osteoarthritis, unspecified site: Secondary | ICD-10-CM | POA: Diagnosis present

## 2021-08-16 DIAGNOSIS — E876 Hypokalemia: Secondary | ICD-10-CM | POA: Diagnosis present

## 2021-08-16 DIAGNOSIS — Z7984 Long term (current) use of oral hypoglycemic drugs: Secondary | ICD-10-CM | POA: Diagnosis not present

## 2021-08-16 DIAGNOSIS — Z79899 Other long term (current) drug therapy: Secondary | ICD-10-CM

## 2021-08-16 DIAGNOSIS — E119 Type 2 diabetes mellitus without complications: Secondary | ICD-10-CM | POA: Diagnosis present

## 2021-08-16 DIAGNOSIS — Z87891 Personal history of nicotine dependence: Secondary | ICD-10-CM | POA: Diagnosis not present

## 2021-08-16 DIAGNOSIS — C181 Malignant neoplasm of appendix: Secondary | ICD-10-CM | POA: Diagnosis present

## 2021-08-16 DIAGNOSIS — K921 Melena: Secondary | ICD-10-CM | POA: Diagnosis present

## 2021-08-16 DIAGNOSIS — K91840 Postprocedural hemorrhage and hematoma of a digestive system organ or structure following a digestive system procedure: Principal | ICD-10-CM | POA: Diagnosis present

## 2021-08-16 DIAGNOSIS — Z9049 Acquired absence of other specified parts of digestive tract: Secondary | ICD-10-CM

## 2021-08-16 DIAGNOSIS — D62 Acute posthemorrhagic anemia: Secondary | ICD-10-CM | POA: Diagnosis present

## 2021-08-16 DIAGNOSIS — Z20822 Contact with and (suspected) exposure to covid-19: Secondary | ICD-10-CM | POA: Diagnosis present

## 2021-08-16 DIAGNOSIS — Z7982 Long term (current) use of aspirin: Secondary | ICD-10-CM

## 2021-08-16 DIAGNOSIS — Y836 Removal of other organ (partial) (total) as the cause of abnormal reaction of the patient, or of later complication, without mention of misadventure at the time of the procedure: Secondary | ICD-10-CM | POA: Diagnosis present

## 2021-08-16 DIAGNOSIS — K625 Hemorrhage of anus and rectum: Principal | ICD-10-CM

## 2021-08-16 LAB — COMPREHENSIVE METABOLIC PANEL
ALT: 30 U/L (ref 0–44)
AST: 58 U/L — ABNORMAL HIGH (ref 15–41)
Albumin: 2.6 g/dL — ABNORMAL LOW (ref 3.5–5.0)
Alkaline Phosphatase: 57 U/L (ref 38–126)
Anion gap: 13 (ref 5–15)
BUN: 11 mg/dL (ref 8–23)
CO2: 23 mmol/L (ref 22–32)
Calcium: 8.3 mg/dL — ABNORMAL LOW (ref 8.9–10.3)
Chloride: 108 mmol/L (ref 98–111)
Creatinine, Ser: 1.25 mg/dL — ABNORMAL HIGH (ref 0.61–1.24)
GFR, Estimated: >60 mL/min (ref >60)
Glucose, Bld: 165 mg/dL — ABNORMAL HIGH (ref 70–99)
Potassium: 3.7 mmol/L (ref 3.5–5.1)
Sodium: 144 mmol/L (ref 135–145)
Total Bilirubin: 1.4 mg/dL — ABNORMAL HIGH (ref 0.3–1.2)
Total Protein: 5.8 g/dL — ABNORMAL LOW (ref 6.5–8.1)

## 2021-08-16 LAB — CBC
HCT: 24.2 % — ABNORMAL LOW (ref 39.0–52.0)
Hemoglobin: 7.4 g/dL — ABNORMAL LOW (ref 13.0–17.0)
MCH: 29.8 pg (ref 26.0–34.0)
MCHC: 30.6 g/dL (ref 30.0–36.0)
MCV: 97.6 fL (ref 80.0–100.0)
Platelets: 397 10*3/uL (ref 150–400)
RBC: 2.48 MIL/uL — ABNORMAL LOW (ref 4.22–5.81)
RDW: 16.6 % — ABNORMAL HIGH (ref 11.5–15.5)
WBC: 13.8 10*3/uL — ABNORMAL HIGH (ref 4.0–10.5)
nRBC: 0.1 % (ref 0.0–0.2)

## 2021-08-16 LAB — GLUCOSE, CAPILLARY: Glucose-Capillary: 237 mg/dL — ABNORMAL HIGH (ref 70–99)

## 2021-08-16 LAB — PROTIME-INR
INR: 1.2 (ref 0.8–1.2)
Prothrombin Time: 14.8 seconds (ref 11.4–15.2)

## 2021-08-16 LAB — RESP PANEL BY RT-PCR (FLU A&B, COVID) ARPGX2
Influenza A by PCR: NEGATIVE
Influenza B by PCR: NEGATIVE
SARS Coronavirus 2 by RT PCR: NEGATIVE

## 2021-08-16 LAB — PREPARE RBC (CROSSMATCH)

## 2021-08-16 MED ORDER — PANTOPRAZOLE SODIUM 40 MG IV SOLR
40.0000 mg | Freq: Every day | INTRAVENOUS | Status: DC
  Administered 2021-08-16: 40 mg via INTRAVENOUS
  Filled 2021-08-16 (×2): qty 10

## 2021-08-16 MED ORDER — ESCITALOPRAM OXALATE 10 MG PO TABS
10.0000 mg | ORAL_TABLET | Freq: Every day | ORAL | Status: DC
  Administered 2021-08-16 – 2021-08-17 (×2): 10 mg via ORAL
  Filled 2021-08-16 (×2): qty 1

## 2021-08-16 MED ORDER — ACETAMINOPHEN 650 MG RE SUPP: 650.0000 mg | Freq: Four times a day (QID) | RECTAL | Status: DC | PRN

## 2021-08-16 MED ORDER — INSULIN ASPART 100 UNIT/ML IJ SOLN
0.0000 [IU] | Freq: Three times a day (TID) | INTRAMUSCULAR | Status: DC
  Administered 2021-08-17: 2 [IU] via SUBCUTANEOUS
  Administered 2021-08-17: 3 [IU] via SUBCUTANEOUS
  Administered 2021-08-17 – 2021-08-18 (×2): 2 [IU] via SUBCUTANEOUS

## 2021-08-16 MED ORDER — LACTATED RINGERS IV SOLN
INTRAVENOUS | Status: DC
Start: 2021-08-16 — End: 2021-08-18

## 2021-08-16 MED ORDER — ATORVASTATIN CALCIUM 40 MG PO TABS
80.0000 mg | ORAL_TABLET | Freq: Every evening | ORAL | Status: DC
  Administered 2021-08-16 – 2021-08-17 (×2): 80 mg via ORAL
  Filled 2021-08-16 (×2): qty 2

## 2021-08-16 MED ORDER — ONDANSETRON HCL 4 MG/2ML IJ SOLN: 4.0000 mg | Freq: Four times a day (QID) | INTRAMUSCULAR | Status: DC | PRN

## 2021-08-16 MED ORDER — SODIUM CHLORIDE 0.9 % IV BOLUS
500.0000 mL | Freq: Once | INTRAVENOUS | Status: AC
  Administered 2021-08-16: 500 mL via INTRAVENOUS

## 2021-08-16 MED ORDER — ACETAMINOPHEN 325 MG PO TABS: 650.0000 mg | ORAL_TABLET | Freq: Four times a day (QID) | ORAL | Status: DC | PRN

## 2021-08-16 MED ORDER — METOPROLOL TARTRATE 25 MG PO TABS
25.0000 mg | ORAL_TABLET | Freq: Two times a day (BID) | ORAL | Status: DC
  Administered 2021-08-16 – 2021-08-18 (×4): 25 mg via ORAL
  Filled 2021-08-16 (×4): qty 1

## 2021-08-16 MED ORDER — METFORMIN HCL ER 500 MG PO TB24
500.0000 mg | ORAL_TABLET | Freq: Every evening | ORAL | Status: DC
  Administered 2021-08-16 – 2021-08-17 (×2): 500 mg via ORAL
  Filled 2021-08-16 (×4): qty 1

## 2021-08-16 MED ORDER — HYDROCODONE-ACETAMINOPHEN 5-325 MG PO TABS: 1.0000 | ORAL_TABLET | ORAL | Status: DC | PRN

## 2021-08-16 MED ORDER — SIMETHICONE 80 MG PO CHEW: 40.0000 mg | CHEWABLE_TABLET | Freq: Four times a day (QID) | ORAL | Status: DC | PRN

## 2021-08-16 MED ORDER — ONDANSETRON 4 MG PO TBDP: 4.0000 mg | ORAL_TABLET | Freq: Four times a day (QID) | ORAL | Status: DC | PRN

## 2021-08-16 MED ORDER — SODIUM CHLORIDE 0.9% IV SOLUTION: Freq: Once | INTRAVENOUS | Status: AC

## 2021-08-16 NOTE — ED Provider Notes (Signed)
[facility] Provider Note   CSN: [id number] Arrival date & time: 08/16/21  1420     History {Add pertinent medical, surgical, social history, OB history to HPI:1} Chief Complaint  Patient presents with   Rectal Bleeding    Blake Rodriguez is a 71 y.o. male.  Patient with history of diabetes and hypertension and was recently discharged from the hospital with right hemicolectomy.  Patient has been having bleeding that has been from his rectum which has been red.  He had 3 episodes yesterday of significant rectal bleeding and 2 today.  Patient also had some dizziness   Rectal Bleeding      Home Medications Prior to Admission medications   Medication Sig Start Date End Date Taking? Authorizing Provider  acetaminophen (TYLENOL) 325 MG tablet Take 2 tablets (650 mg total) by mouth every 6 (six) hours as needed for mild pain, fever or headache. 04/28/21   Roxan Hockey, MD  Alogliptin Benzoate 12.5 MG TABS Take 12.5 mg by mouth in the morning.    [provider]  aspirin EC 81 MG tablet Take 1 tablet (81 mg total) by mouth daily with breakfast. 04/28/21   Roxan Hockey, MD  atorvastatin (LIPITOR) 80 MG tablet Take 80 mg by mouth every evening. 08/19/17   [provider]  Cholecalciferol (VITAMIN D3 PO) Take 2 capsules by mouth daily.    [provider]  empagliflozin (JARDIANCE) 25 MG TABS tablet Take 25 mg by mouth in the morning. 10/15/20   [provider]  escitalopram (LEXAPRO) 10 MG tablet Take 10 mg by mouth at bedtime.    [provider]  ezetimibe (ZETIA) 10 MG tablet Take 10 mg by mouth in the morning.    [provider]  metFORMIN (GLUCOPHAGE-XR) 500 MG 24 hr tablet Take 500 mg by mouth every evening. 08/23/17   [provider]  metoprolol tartrate (LOPRESSOR) 25 MG tablet Take 1 tablet (25 mg total) by mouth 2 (two) times daily. 07/08/21   Orson Eva, MD  NIFEdipine (PROCARDIA XL/NIFEDICAL-XL)  90 MG 24 hr tablet Take 1 tablet (90 mg total) by mouth in the morning. 08/05/21   [name], MD  ondansetron (ZOFRAN) 4 MG tablet Take 1 tablet (4 mg total) by mouth every 8 (eight) hours as needed. Patient not taking: Reported on 07/29/2021 06/26/21 06/26/22  [name], MD  oxyCODONE (ROXICODONE) 5 MG immediate release tablet Take 1 tablet (5 mg total) by mouth every 4 (four) hours as needed for breakthrough pain or severe pain. 08/14/21 08/14/22  Aviva Signs, MD      Allergies    Patient has no known allergies.    Review of Systems   Review of Systems  Gastrointestinal:  Positive for hematochezia.    Physical Exam Updated Vital Signs BP 129/69 (BP Location: Right Arm)   Pulse 100   Temp 97.9 F (36.6 C) (Oral)   Resp 16   Ht '5\' 9"'$  (1.753 m)   Wt 65.8 kg   SpO2 99%   BMI 21.41 kg/m  Physical Exam  ED Results / Procedures / Treatments   Labs (all labs ordered are listed, but only abnormal results are displayed) Labs Reviewed  COMPREHENSIVE METABOLIC PANEL - Abnormal; Notable for the following components:      Result Value   Glucose, Bld 165 (*)    Creatinine, Ser 1.25 (*)    Calcium 8.3 (*)    Total Protein 5.8 (*)    Albumin  2.6 (*)    AST 58 (*)    Total Bilirubin 1.4 (*)    All other components within normal limits  CBC - Abnormal; Notable for the following components:   WBC 13.8 (*)    RBC 2.48 (*)    Hemoglobin 7.4 (*)    HCT 24.2 (*)    RDW 16.6 (*)    All other components within normal limits  POC OCCULT BLOOD, ED  TYPE AND SCREEN    EKG None  Radiology No results found.  Procedures Procedures  {Document cardiac monitor, telemetry assessment procedure when appropriate:1}  Medications Ordered in ED Medications  sodium chloride 0.9 % bolus 500 mL (500 mLs Intravenous New Bag/Given 08/16/21 1549)    ED Course/ Medical Decision Making/ A&P                           Medical Decision Making Amount and/or Complexity of Data  Reviewed Labs: ordered.  Risk Decision regarding hospitalization.   Patient with rectal bleeding status post surgery.  He will be admitted to the surgical service for observation  {Document critical care time when appropriate:1} {Document review of labs and clinical decision tools ie heart score, Chads2Vasc2 etc:1}  {Document your independent review of radiology images, and any outside records:1} {Document your discussion with family members, caretakers, and with consultants:1} {Document social determinants of health affecting pt's care:1} {Document your decision making why or why not admission, treatments were needed:1} Final Clinical Impression(s) / ED Diagnoses Final diagnoses:  Rectal bleeding    Rx / DC Orders ED Discharge Orders     None

## 2021-08-16 NOTE — ED Triage Notes (Signed)
Pt had surgery on Monday for right lower colonectomy, small bowel right side due to appendicular hole. Pt left hospital on Thursday. Pt c/o bleeding red stool x 3 yesterday and x 2 today.

## 2021-08-16 NOTE — ED Notes (Signed)
Metformin not in pixis

## 2021-08-16 NOTE — H&P (Signed)
Blake Rodriguez is an 71 y.o. male.   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  He also had some generalized malaise and lightheadedness.  Today, he had another episode of hematochezia and the family brought him into any [facility] for further evaluation and treatment.  He states he never lost consciousness.  He denies any abdominal pain or the urge to move his bowels.  He denies any hematemesis.  His hemoglobin on discharge was 8.7 and now it is 7.4.   Past Medical History:  Diagnosis Date   Arthritis    Diabetes mellitus without complication ([place])    Hypertension    Lung collapse     Past Surgical History:  Procedure Laterality Date   BOWEL RESECTION N/A 08/11/2021   Procedure: EN BLOC RESECTION, RIGHT HEMICOLECTOMY, ADDITIONAL SMALL BOWEL RESECTION;  Surgeon: [name], MD;  Location: [facility];  Service: General;  Laterality: N/A;   IR RADIOLOGIST EVAL & MGMT  05/28/2021   IR RADIOLOGIST EVAL & MGMT  06/11/2021   LAPAROSCOPIC APPENDECTOMY N/A 06/26/2021   Procedure: APPENDECTOMY LAPAROSCOPIC ;  Surgeon: [name], MD;  Location: [facility];  Service: General;  Laterality: N/A;   LUNG BIOPSY  06/10/1983   SHOULDER ARTHROCENTESIS Left     Family History  Problem Relation Age of Onset   Lung cancer Brother    Social History:  reports that he quit smoking about 28 years ago. His smoking use included cigarettes. He has a 15.00 pack-year smoking history. He has never used smokeless tobacco. He reports current alcohol use. He reports that he does not use drugs.  Allergies: No Known Allergies  (Not in a hospital admission)   Results for orders placed or performed during the hospital encounter of 08/16/21 (from the past 48 hour(s))   Comprehensive metabolic panel     Status: Abnormal   Collection Time: 08/16/21  2:54 PM  Result Value Ref Range   Sodium 144 135 - 145 mmol/L   Potassium 3.7 3.5 - 5.1 mmol/L   Chloride 108 98 - 111 mmol/L   CO2 23 22 - 32 mmol/L   Glucose, Bld 165 (H) 70 - 99 mg/dL    Comment: Glucose reference range applies only to samples taken after fasting for at least 8 hours.   BUN 11 8 - 23 mg/dL   Creatinine, Ser 1.25 (H) 0.61 - 1.24 mg/dL   Calcium 8.3 (L) 8.9 - 10.3 mg/dL   Total Protein 5.8 (L) 6.5 - 8.1 g/dL   Albumin 2.6 (L) 3.5 - 5.0 g/dL   AST 58 (H) 15 - 41 U/L   ALT 30 0 - 44 U/L   Alkaline Phosphatase 57 38 - 126 U/L   Total Bilirubin 1.4 (H) 0.3 - 1.2 mg/dL   GFR, Estimated >60 >60 mL/min    Comment: (NOTE) Calculated using the CKD-EPI Creatinine Equation (2021)    Anion gap 13 5 - 15    Comment: Performed at [facility], [address], [place], [place] [address]  CBC     Status: Abnormal   Collection Time: 08/16/21  2:54 PM  Result Value Ref Range   WBC 13.8 (H) 4.0 - 10.5 K/uL   RBC 2.48 (L) 4.22 - 5.81 MIL/uL   Hemoglobin 7.4 (L) 13.0 - 17.0 g/dL  HCT 24.2 (L) 39.0 - 52.0 %   MCV 97.6 80.0 - 100.0 fL   MCH 29.8 26.0 - 34.0 pg   MCHC 30.6 30.0 - 36.0 g/dL   RDW 16.6 (H) 11.5 - 15.5 %   Platelets 397 150 - 400 K/uL   nRBC 0.1 0.0 - 0.2 %    Comment: Performed at [facility], [address], [place], [place] [address]  Type and screen [facility]     Status: None   Collection Time: 08/16/21  2:54 PM  Result Value Ref Range   ABO/RH(D) A POS    Antibody Screen NEG    Sample Expiration      08/19/2021,2359 Performed at [facility] [address], [place], [place] [address]    No results found.  Review of Systems  Gastrointestinal:  Positive for blood in stool.  All other systems reviewed and are negative.   Blood pressure 129/69, pulse 100, temperature 97.9 F (36.6 C), temperature source Oral, resp. rate 16, height '5\' 9"'$  (1.753 m), weight 65.8  kg, SpO2 99 %. Physical Exam Vitals reviewed.  Constitutional:      Appearance: Normal appearance. He is normal weight. He is not ill-appearing.  HENT:     Head: Normocephalic and atraumatic.  Cardiovascular:     Rate and Rhythm: Normal rate and regular rhythm.     Heart sounds: Normal heart sounds. No murmur heard.    No friction rub. No gallop.  Pulmonary:     Effort: Pulmonary effort is normal. No respiratory distress.     Breath sounds: Normal breath sounds. No stridor. No wheezing, rhonchi or rales.  Abdominal:     General: Bowel sounds are normal. There is no distension.     Palpations: Abdomen is soft. There is no mass.     Tenderness: There is no abdominal tenderness. There is no guarding or rebound.     Hernia: No hernia is present.     Comments: Midline incision healing well without drainage or ecchymosis.  Skin:    General: Skin is warm and dry.  Neurological:     Mental Status: He is alert and oriented to person, place, and time.      Assessment/Plan Impression: Recurrent hematochezia most likely secondary to anastomotic bleed.  As he was symptomatic, will transfuse 1 unit of packed red blood cells.  Will place on soft diet.  Will monitor his bowel movements.  Check INR.  Aviva Signs, MD 08/16/2021, 5:31 PM

## 2021-08-17 LAB — TYPE AND SCREEN
ABO/RH(D): A POS
ABO/RH(D): A POS
Antibody Screen: NEGATIVE
Antibody Screen: NEGATIVE
Unit division: 0
Unit division: 0
Unit division: 0

## 2021-08-17 LAB — BPAM RBC
Blood Product Expiration Date: 202307132359
Blood Product Expiration Date: 202307142359
Blood Product Expiration Date: 202307132359
ISSUE DATE / TIME: 202306140957
ISSUE DATE / TIME: 202306172103
Unit Type and Rh: 6200
Unit Type and Rh: 6200
Unit Type and Rh: 6200

## 2021-08-17 LAB — CBC
HCT: 25.8 % — ABNORMAL LOW (ref 39.0–52.0)
Hemoglobin: 8.4 g/dL — ABNORMAL LOW (ref 13.0–17.0)
MCH: 30.3 pg (ref 26.0–34.0)
MCHC: 32.6 g/dL (ref 30.0–36.0)
MCV: 93.1 fL (ref 80.0–100.0)
Platelets: 371 10*3/uL (ref 150–400)
RBC: 2.77 MIL/uL — ABNORMAL LOW (ref 4.22–5.81)
RDW: 16.2 % — ABNORMAL HIGH (ref 11.5–15.5)
WBC: 11.7 10*3/uL — ABNORMAL HIGH (ref 4.0–10.5)
nRBC: 0.3 % — ABNORMAL HIGH (ref 0.0–0.2)

## 2021-08-17 LAB — HEMOGLOBIN AND HEMATOCRIT, BLOOD
HCT: 26.2 % — ABNORMAL LOW (ref 39.0–52.0)
Hemoglobin: 8.5 g/dL — ABNORMAL LOW (ref 13.0–17.0)

## 2021-08-17 LAB — COMPREHENSIVE METABOLIC PANEL
ALT: 29 U/L (ref 0–44)
AST: 55 U/L — ABNORMAL HIGH (ref 15–41)
Albumin: 2.6 g/dL — ABNORMAL LOW (ref 3.5–5.0)
Alkaline Phosphatase: 53 U/L (ref 38–126)
Anion gap: 10 (ref 5–15)
BUN: 11 mg/dL (ref 8–23)
CO2: 23 mmol/L (ref 22–32)
Calcium: 8.2 mg/dL — ABNORMAL LOW (ref 8.9–10.3)
Chloride: 109 mmol/L (ref 98–111)
Creatinine, Ser: 1.19 mg/dL (ref 0.61–1.24)
GFR, Estimated: >60 mL/min (ref >60)
Glucose, Bld: 141 mg/dL — ABNORMAL HIGH (ref 70–99)
Potassium: 4 mmol/L (ref 3.5–5.1)
Sodium: 142 mmol/L (ref 135–145)
Total Bilirubin: 1.6 mg/dL — ABNORMAL HIGH (ref 0.3–1.2)
Total Protein: 5.9 g/dL — ABNORMAL LOW (ref 6.5–8.1)

## 2021-08-17 LAB — GLUCOSE, CAPILLARY
Glucose-Capillary: 187 mg/dL — ABNORMAL HIGH (ref 70–99)
Glucose-Capillary: 146 mg/dL — ABNORMAL HIGH (ref 70–99)
Glucose-Capillary: 175 mg/dL — ABNORMAL HIGH (ref 70–99)

## 2021-08-17 LAB — MAGNESIUM: Magnesium: 1.9 mg/dL (ref 1.7–2.4)

## 2021-08-17 LAB — PHOSPHORUS: Phosphorus: 3.1 mg/dL (ref 2.5–4.6)

## 2021-08-17 MED ORDER — EMPAGLIFLOZIN 25 MG PO TABS
25.0000 mg | ORAL_TABLET | Freq: Every day | ORAL | Status: DC
  Administered 2021-08-17 – 2021-08-18 (×2): 25 mg via ORAL
  Filled 2021-08-17 (×4): qty 1

## 2021-08-17 MED ORDER — NIFEDIPINE ER OSMOTIC RELEASE 30 MG PO TB24
90.0000 mg | ORAL_TABLET | Freq: Every day | ORAL | Status: DC
  Administered 2021-08-17 – 2021-08-18 (×2): 90 mg via ORAL
  Filled 2021-08-17 (×2): qty 3

## 2021-08-17 MED ORDER — PANTOPRAZOLE SODIUM 40 MG PO TBEC
40.0000 mg | DELAYED_RELEASE_TABLET | Freq: Once | ORAL | Status: AC
  Administered 2021-08-17: 40 mg via ORAL
  Filled 2021-08-17: qty 1

## 2021-08-17 NOTE — Progress Notes (Signed)
  Subjective: Patient has no complaints.  Had brown bowel movement this morning.  No blood noted.  Objective: Vital signs in last 24 hours: Temp:  [97.6 F (36.4 C)-98.5 F (36.9 C)] 97.6 F (36.4 C) (06/18 0823) Pulse Rate:  [86-100] 90 (06/18 0823) Resp:  [15-19] 18 (06/18 0823) BP: (129-146)/(59-70) 132/67 (06/18 0823) SpO2:  [99 %-100 %] 100 % (06/18 0823) Weight:  [61 kg-65.8 kg] 61 kg (06/17 2045) Last BM Date : 08/16/21  Intake/Output from previous day: 06/17 0701 - 06/18 0700 In: 420 [Blood:420] Out: -  Intake/Output this shift: No intake/output data recorded.  General appearance: alert, cooperative, and no distress Resp: clear to auscultation bilaterally Cardio: regular rate and rhythm, S1, S2 normal, no murmur, click, rub or gallop GI: soft, non-tender; bowel sounds normal; no masses,  no organomegaly and incision healing well.  Lab Results:  Recent Labs    08/16/21 1454 08/17/21 0703  WBC 13.8* 11.7*  HGB 7.4* 8.4*  HCT 24.2* 25.8*  PLT 397 371   BMET Recent Labs    08/16/21 1454 08/17/21 0554  NA 144 142  K 3.7 4.0  CL 108 109  CO2 23 23  GLUCOSE 165* 141*  BUN 11 11  CREATININE 1.25* 1.19  CALCIUM 8.3* 8.2*   PT/INR Recent Labs    08/16/21 1454  LABPROT 14.8  INR 1.2    Studies/Results: No results found.  Anti-infectives: Anti-infectives (From admission, onward)    None       Assessment/Plan: Impression: Postoperative anemia secondary to surgical blood loss, hematochezia.  Responded appropriately with 1 unit of packed red blood cells.  Hematochezia seems to have stopped. Plan: We will monitor for the next 24 hours.  We will check hemoglobin at 2:00 today.  Anticipate discharge in next 24 to 48 hours.  LOS: 1 day    Aviva Signs 08/17/2021

## 2021-08-18 LAB — CBC
HCT: 25.6 % — ABNORMAL LOW (ref 39.0–52.0)
Hemoglobin: 8.2 g/dL — ABNORMAL LOW (ref 13.0–17.0)
MCH: 29.9 pg (ref 26.0–34.0)
MCHC: 32 g/dL (ref 30.0–36.0)
MCV: 93.4 fL (ref 80.0–100.0)
Platelets: 375 10*3/uL (ref 150–400)
RBC: 2.74 MIL/uL — ABNORMAL LOW (ref 4.22–5.81)
RDW: 16.4 % — ABNORMAL HIGH (ref 11.5–15.5)
WBC: 11 10*3/uL — ABNORMAL HIGH (ref 4.0–10.5)
nRBC: 0.2 % (ref 0.0–0.2)

## 2021-08-18 LAB — BASIC METABOLIC PANEL
Anion gap: 7 (ref 5–15)
BUN: 8 mg/dL (ref 8–23)
CO2: 27 mmol/L (ref 22–32)
Calcium: 8.1 mg/dL — ABNORMAL LOW (ref 8.9–10.3)
Chloride: 107 mmol/L (ref 98–111)
Creatinine, Ser: 1 mg/dL (ref 0.61–1.24)
GFR, Estimated: >60 mL/min (ref >60)
Glucose, Bld: 150 mg/dL — ABNORMAL HIGH (ref 70–99)
Potassium: 3 mmol/L — ABNORMAL LOW (ref 3.5–5.1)
Sodium: 141 mmol/L (ref 135–145)

## 2021-08-18 LAB — GLUCOSE, CAPILLARY
Glucose-Capillary: 148 mg/dL — ABNORMAL HIGH (ref 70–99)
Glucose-Capillary: 130 mg/dL — ABNORMAL HIGH (ref 70–99)

## 2021-08-18 NOTE — TOC Initial Note (Signed)
Transition of Care [facility]) - Initial/Assessment Note    Patient Details  Name: Blake Rodriguez MRN: [id number] Date of Birth: 06-30-1950  [facility]) CM/SW Contact:    Shade Flood, LCSW Phone Number: 08/18/2021, 9:45 AM  Clinical Narrative:                  Pt admitted from home. Pt had surgery last week and is readmitted due to rectal bleeding and low Hgb. Pt resides at home with his wife and plan is for return to home at dc. TOC will follow and assist if dc needs arise.  [facility] notification complete. Reference number is [id number].  Expected Discharge Plan: Home/Self Care Barriers to Discharge: Continued Medical Work up   Patient Goals and CMS Choice Patient states their goals for this hospitalization and ongoing recovery are:: go home      Expected Discharge Plan and Services Expected Discharge Plan: Home/Self Care In-house Referral: Clinical Social Work     Living arrangements for the past 2 months: Single Family Home                                      Prior Living Arrangements/Services Living arrangements for the past 2 months: Single Family Home Lives with:: Spouse Patient language and need for interpreter reviewed:: Yes Do you feel safe going back to the place where you live?: Yes      Need for Family Participation in Patient Care: No (Comment)     Criminal Activity/Legal Involvement Pertinent to Current Situation/Hospitalization: No - Comment as needed  Activities of Daily Living Home Assistive Devices/Equipment: None ADL Screening (condition at time of admission) Patient's cognitive ability adequate to safely complete daily activities?: Yes Is the patient deaf or have difficulty hearing?: No Does the patient have difficulty seeing, even when wearing glasses/contacts?: No Does the patient have difficulty concentrating, remembering, or making decisions?: No Patient able to express need for assistance with ADLs?: Yes Does the  patient have difficulty dressing or bathing?: No Independently performs ADLs?: Yes (appropriate for developmental age) Does the patient have difficulty walking or climbing stairs?: No Weakness of Legs: Left Weakness of Arms/Hands: Right  Permission Sought/Granted                  Emotional Assessment       Orientation: : Oriented to Self, Oriented to Place, Oriented to  Time, Oriented to Situation Alcohol / Substance Use: Not Applicable Psych Involvement: No (comment)  Admission diagnosis:  Hematochezia [K92.1] Rectal bleeding [K62.5] Patient Active Problem List   Diagnosis Date Noted   Hematochezia 08/16/2021   Chronic kidney disease, stage 3b ([place]) 07/06/2021   Intra-abdominal abscess ([place]) 07/05/2021   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   Abscess 05/20/2021   Colonic diverticular abscess 05/20/2021   Abscess of appendix 05/16/2021   Acute perforated appendicitis 04/26/2021   High anion gap metabolic acidosis 53/66/4403   Type 2 diabetes mellitus with hyperglycemia ([place]) 04/26/2021   AKI (acute kidney injury) ([place]) 04/26/2021   Kidney lesion 04/26/2021   Essential hypertension 04/26/2021   Mixed hyperlipidemia 04/26/2021   Sepsis due to undetermined organism ([place]) 04/26/2021   Dehydration 04/26/2021   PCP:  [place]:   [place], [place] - [address] [place] [address] Phone: [phone] Fax: [place] [phone] - [place], [place] -  [place] [address] [place] [address] Phone: [phone] Fax: [phone]     Social Determinants of Health (SDOH) Interventions    Readmission Risk Interventions    08/18/2021    9:44 AM  Readmission Risk Prevention Plan  Transportation Screening Complete  HRI or Home Care Consult Complete  Social Work Consult for [place]  Planning/Counseling Complete  Palliative Care Screening Not Applicable  Medication Review Press photographer) Complete

## 2021-08-18 NOTE — Discharge Summary (Signed)
Physician Discharge Summary  Patient ID: Blake Rodriguez MRN: [id number] DOB/AGE: [birth year] 71 y.o.  Admit date: 08/16/2021 Discharge date: 08/18/2021  Admission Diagnoses: Anemia secondary to hematochezia  Discharge Diagnoses: Same Principal Problem:   Hematochezia Non-insulin-dependent diabetes mellitus Status post right hemicolectomy for appendiceal carcinoma  Discharged Condition: good  Hospital Course: Patient is a 71 year old white male status post right hemicolectomy for appendiceal carcinoma who presented back to the emergency room with lightheadedness and hematochezia.  He was noted to have a hemoglobin of 7.4.  He was admitted to the hospital for observation and blood transfusion.  His hemoglobin the following day came up to 8.4.  Repeat hemoglobins stayed in the 8 range.  He is asymptomatic.  He has had no further hematochezia.  He is being discharged home on 08/18/2021 in good and improving condition.  He was noted to be still anemic and the patient has been taking vitamin D and iron supplements at home.  He was noted to be slightly hypokalemic and he will supplement that with potassium containing foods.  Discharge Exam: Blood pressure 126/66, pulse 83, temperature 97.7 F (36.5 C), temperature source Oral, resp. rate 20, height '5\' 9"'$  (1.753 m), weight 61 kg, SpO2 100 %. General appearance: alert, cooperative, and no distress Resp: clear to auscultation bilaterally Cardio: regular rate and rhythm, S1, S2 normal, no murmur, click, rub or gallop GI: soft, non-tender; bowel sounds normal; no masses,  no organomegaly and incision healing well.  Disposition: Discharge disposition: 01-Home or Self Care       Discharge Instructions     Diet - low sodium heart healthy   Complete by: As directed    Increase activity slowly   Complete by: As directed       Allergies as of 08/18/2021   No Known Allergies      Medication List     STOP taking these medications     aspirin EC 81 MG tablet       TAKE these medications    acetaminophen 325 MG tablet Commonly known as: TYLENOL Take 2 tablets (650 mg total) by mouth every 6 (six) hours as needed for mild pain, fever or headache.   Alogliptin Benzoate 12.5 MG Tabs Take 12.5 mg by mouth in the morning.   atorvastatin 80 MG tablet Commonly known as: LIPITOR Take 80 mg by mouth every evening.   cyanocobalamin 100 MCG tablet Take 3 tablets by mouth daily.   empagliflozin 25 MG Tabs tablet Commonly known as: JARDIANCE Take 25 mg by mouth in the morning.   escitalopram 10 MG tablet Commonly known as: LEXAPRO Take 10 mg by mouth at bedtime.   ezetimibe 10 MG tablet Commonly known as: ZETIA Take 10 mg by mouth in the morning.   metFORMIN 500 MG 24 hr tablet Commonly known as: GLUCOPHAGE-XR Take 500 mg by mouth every evening.   metoprolol tartrate 25 MG tablet Commonly known as: LOPRESSOR Take 1 tablet (25 mg total) by mouth 2 (two) times daily.   NIFEdipine 90 MG 24 hr tablet Commonly known as: PROCARDIA XL/NIFEDICAL-XL Take 1 tablet (90 mg total) by mouth in the morning.   ondansetron 4 MG tablet Commonly known as: Zofran Take 1 tablet (4 mg total) by mouth every 8 (eight) hours as needed.   oxyCODONE 5 MG immediate release tablet Commonly known as: Roxicodone Take 1 tablet (5 mg total) by mouth every 4 (four) hours as needed for breakthrough pain or severe pain.   pioglitazone 15 MG  tablet Commonly known as: ACTOS Take 15 mg by mouth daily.   tiZANidine 4 MG tablet Commonly known as: ZANAFLEX Take 4 mg by mouth at bedtime as needed for muscle spasms.   VITAMIN D3 PO Take 2 capsules by mouth daily.   Cholecalciferol 50 MCG (2000 UT) Tabs Take 2 tablets by mouth daily.         Signed: Aviva Signs 08/18/2021, 9:45 AM

## 2021-08-19 LAB — GLUCOSE, CAPILLARY: Glucose-Capillary: 154 mg/dL — ABNORMAL HIGH (ref 70–99)

## 2021-08-27 ENCOUNTER — Encounter: Payer: Self-pay | Admitting: General Surgery

## 2021-08-27 ENCOUNTER — Ambulatory Visit (INDEPENDENT_AMBULATORY_CARE_PROVIDER_SITE_OTHER): Payer: Medicare (Managed Care) | Admitting: General Surgery

## 2021-08-27 VITALS — BP 130/70 | HR 105 | Temp 98.0°F | Resp 14 | Ht 69.0 in | Wt 140.0 lb

## 2021-08-27 DIAGNOSIS — C181 Malignant neoplasm of appendix: Secondary | ICD-10-CM

## 2021-08-27 NOTE — Progress Notes (Unsigned)
[facility]  Future Appointments  Date Time Provider [place]  09/11/2021 11:30 AM Derek Jack, MD [facility] None  10/21/2021  9:15 AM [name], MD [facility] None

## 2021-08-27 NOTE — Patient Instructions (Signed)
No heavy lifting > 10 lbs, excessive bending, pushing, pulling, or squatting for 6-8 weeks after surgery.  Diet as tolerated. Call with issues.  If Dr. Delton Coombes wants you to do chemotherapy and you need a port, we are happy to get this placed.  Fingers crossed you will not need this.

## 2021-09-04 ENCOUNTER — Encounter (HOSPITAL_COMMUNITY): Payer: Self-pay

## 2021-09-04 ENCOUNTER — Other Ambulatory Visit: Payer: Self-pay

## 2021-09-04 ENCOUNTER — Telehealth: Payer: Self-pay | Admitting: *Deleted

## 2021-09-04 DIAGNOSIS — Z5321 Procedure and treatment not carried out due to patient leaving prior to being seen by health care provider: Secondary | ICD-10-CM | POA: Insufficient documentation

## 2021-09-04 DIAGNOSIS — R2243 Localized swelling, mass and lump, lower limb, bilateral: Secondary | ICD-10-CM | POA: Insufficient documentation

## 2021-09-04 NOTE — Telephone Encounter (Signed)
Received call from patient daughter, Wyatt Portela [phone]~ telephone.   Reports that patient has marked edema on RLE with pain. States that she is concerned about possible DVT.   Advised to return to ER for evaluation. Advised that D [place] and [place] can be performed there.

## 2021-09-04 NOTE — ED Triage Notes (Signed)
Pt presents with bilateral foot swelling. Denies pain to the feet, CP, ShOB, or orthopnea. Pt states he was standing out in the heat for a while yesterday.

## 2021-09-05 ENCOUNTER — Ambulatory Visit
Admission: EM | Admit: 2021-09-05 | Discharge: 2021-09-05 | Disposition: A | Discharge status: Home / Self Care | Payer: No Typology Code available for payment source

## 2021-09-05 ENCOUNTER — Emergency Department (HOSPITAL_COMMUNITY)
Admission: EM | Admit: 2021-09-05 | Discharge: 2021-09-05 | Discharge status: Against Medical Advice | Payer: No Typology Code available for payment source | Attending: Emergency Medicine | Admitting: Emergency Medicine

## 2021-09-05 DIAGNOSIS — R609 Edema, unspecified: Secondary | ICD-10-CM | POA: Diagnosis not present

## 2021-09-05 NOTE — ED Triage Notes (Signed)
Pt reports swelling in foot x 3 days. States this is first time happened. Pt reports swelling goes down when elevated.

## 2021-09-05 NOTE — ED Provider Notes (Signed)
[facility]    CSN: [id number] Arrival date & time: 09/05/21  9935      History   Chief Complaint Chief Complaint  Patient presents with   Foot Problem    HPI Blake Rodriguez is a 71 y.o. male.   HPI  Patient presents with his spouse for complaints of bilateral lower extremity edema for the past 3 days.  Patient states the swelling worsens throughout the day when he is up on his feet more.  He denies chest pain, shortness of breath, difficulty breathing, calf pain.  He reports that he was told at the [place] that he had a "small blockage" in his legs.  He could not recall more detail regarding what was told to him at that time.  She reports he was recently discharged from the hospital status post right hemicolectomy for appendiceal carcinoma.  Patient reports he has been sleeping ankle socks and has noticed the band of his socks has caused indentions in his ankles.  He does have a history of hypertension, states he has not taken his medication today.  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   Abscess 05/20/2021   Colonic diverticular abscess 05/20/2021   Abscess of appendix 05/16/2021   Acute perforated appendicitis 04/26/2021   High anion gap metabolic acidosis 70/17/7939   Type 2 diabetes mellitus with hyperglycemia ([place]) 04/26/2021   AKI (acute kidney injury) ([place]) 04/26/2021   Kidney lesion 04/26/2021   Essential hypertension 04/26/2021   Mixed hyperlipidemia 04/26/2021   Sepsis due to undetermined organism ([place]) 04/26/2021   Dehydration 04/26/2021    Past Surgical History:  Procedure Laterality Date   BOWEL RESECTION N/A 08/11/2021    Procedure: EN BLOC RESECTION, RIGHT HEMICOLECTOMY, ADDITIONAL SMALL BOWEL RESECTION;  Surgeon: [name], MD;  Location: [facility];  Service: General;  Laterality: N/A;   IR RADIOLOGIST EVAL & MGMT  05/28/2021   IR RADIOLOGIST EVAL & MGMT  06/11/2021   LAPAROSCOPIC APPENDECTOMY N/A 06/26/2021   Procedure: APPENDECTOMY LAPAROSCOPIC ;  Surgeon: [name], MD;  Location: [facility];  Service: General;  Laterality: N/A;   LUNG BIOPSY  06/10/1983   SHOULDER ARTHROCENTESIS Left        Home Medications    Prior to Admission medications   Medication Sig Start Date End Date Taking? Authorizing Provider  acetaminophen (TYLENOL) 325 MG tablet Take 2 tablets (650 mg total) by mouth every 6 (six) hours as needed for mild pain, fever or headache. 04/28/21   Roxan Hockey, MD  Alogliptin Benzoate 12.5 MG TABS Take 12.5 mg by mouth in the morning.    [provider]  atorvastatin (LIPITOR) 80 MG tablet Take 80 mg by mouth every evening. 08/19/17   [provider]  Cholecalciferol (VITAMIN D3 PO) Take 2 capsules by mouth daily.    [provider]  Cholecalciferol 50 MCG (2000 UT) TABS Take 2 tablets by mouth daily. 05/05/21   [provider]  cyanocobalamin 100 MCG tablet Take 3 tablets by mouth daily. 05/05/21   [provider]  empagliflozin (JARDIANCE) 25 MG TABS tablet Take 25 mg by mouth in the morning. 10/15/20  [provider]  escitalopram (LEXAPRO) 10 MG tablet Take 10 mg by mouth at bedtime.    [provider]  ezetimibe (ZETIA) 10 MG tablet Take 10 mg by mouth in the morning.    [provider]  metFORMIN (GLUCOPHAGE-XR) 500 MG 24 hr tablet Take 500 mg by mouth every evening. 08/23/17   [provider]  metoprolol tartrate (LOPRESSOR) 25 MG tablet Take 1 tablet (25 mg total) by mouth 2 (two) times daily. 07/08/21   Orson Eva, MD  NIFEdipine (PROCARDIA XL/NIFEDICAL-XL) 90 MG 24 hr tablet Take 1 tablet (90 mg  total) by mouth in the morning. 08/05/21   [name], MD  ondansetron (ZOFRAN) 4 MG tablet Take 1 tablet (4 mg total) by mouth every 8 (eight) hours as needed. 06/26/21 06/26/22  [name], MD  oxyCODONE (ROXICODONE) 5 MG immediate release tablet Take 1 tablet (5 mg total) by mouth every 4 (four) hours as needed for breakthrough pain or severe pain. 08/14/21 08/14/22  Aviva Signs, MD  pioglitazone (ACTOS) 15 MG tablet Take 15 mg by mouth daily. 05/15/21   [provider]  tiZANidine (ZANAFLEX) 4 MG tablet Take 4 mg by mouth at bedtime as needed for muscle spasms. 05/09/21   [provider]    Family History Family History  Problem Relation Age of Onset   Lung cancer Brother     Social History Social History   Tobacco Use   Smoking status: Former    Packs/day: 0.50    Years: 30.00    Total pack years: 15.00    Types: Cigarettes    Quit date: 06/04/1993    Years since quitting: 28.2   Smokeless tobacco: Never  Vaping Use   Vaping Use: Never used  Substance Use Topics   Alcohol use: Not Currently    Comment: occassional   Drug use: Never     Allergies   Patient has no known allergies.   Review of Systems Review of Systems Per HPI  Physical Exam Triage Vital Signs ED Triage Vitals  Enc Vitals Group     BP 09/05/21 0921 (!) 162/78     Pulse Rate 09/05/21 0921 84     Resp 09/05/21 0921 18     Temp 09/05/21 0921 97.8 F (36.6 C)     Temp Source 09/05/21 0921 Oral     SpO2 09/05/21 0921 96 %     Weight --      Height --      Head Circumference --      Peak Flow --      Pain Score 09/05/21 0920 0     Pain Loc --      Pain Edu? --      Excl. in [place]? --    No data found.  Updated Vital Signs BP (!) 162/78 (BP Location: Right Arm)   Pulse 84   Temp 97.8 F (36.6 C) (Oral)   Resp 18   SpO2 96%   Visual Acuity Right Eye Distance:   Left Eye Distance:   Bilateral Distance:    Right Eye Near:   Left Eye Near:    Bilateral  Near:     Physical Exam Vitals and nursing note reviewed.  Constitutional:      General: He is not in acute distress.    Appearance: He is well-developed.  HENT:     Head: Normocephalic and atraumatic.     Nose: Nose normal.     Mouth/Throat:  Mouth: Mucous membranes are moist.  Eyes:     Extraocular Movements: Extraocular movements intact.     Conjunctiva/sclera: Conjunctivae normal.     Pupils: Pupils are equal, round, and reactive to light.  Cardiovascular:     Rate and Rhythm: Normal rate and regular rhythm.     Heart sounds: No murmur heard. Pulmonary:     Effort: Pulmonary effort is normal. No respiratory distress.     Breath sounds: Normal breath sounds.  Abdominal:     Palpations: Abdomen is soft.     Tenderness: There is no abdominal tenderness.  Musculoskeletal:        General: No swelling.     Cervical back: Neck supple.     Right lower leg: 1+ Edema present.     Left lower leg: 1+ Edema present.  Skin:    General: Skin is warm and dry.     Capillary Refill: Capillary refill takes less than 2 seconds.  Neurological:     General: No focal deficit present.     Mental Status: He is alert and oriented to person, place, and time.     Cranial Nerves: No cranial nerve deficit.  Psychiatric:        Mood and Affect: Mood normal.        Behavior: Behavior normal.      UC Treatments / Results  Labs (all labs ordered are listed, but only abnormal results are displayed) Labs Reviewed - No data to display  EKG   Radiology No results found.  Procedures Procedures (including critical care time)  Medications Ordered in UC Medications - No data to display  Initial Impression / Assessment and Plan / UC Course  I have reviewed the triage vital signs and the nursing notes.  Pertinent labs & imaging results that were available during my care of the patient were reviewed by me and considered in my medical decision making (see chart for details).  Patient  presents for complaints of bilateral lower extremity edema.  Symptoms have been persistent for the past 3 days.  On exam, patient has +1 edema.  Per the patient's spouse, symptoms improved with elevation.  Reviewed patient's most recent hospital admission to [facility].  Patient received a blood transfusion while he was admitted.  His most recent hemoglobin A1c was 6.7 which was done during his admission.  Discussion with the patient and his family regarding causes of lower extremity edema.  Differential diagnoses include congestive heart failure, DVT, uncontrolled hypertension.  Patient's blood pressure is elevated at this time.  He states that he has not taken his blood pressure medicine today.  Recommend supportive care with the use of TED hose and elevation of the lower extremities.  Given his current history of carcinoma, recommend that he follow-up with oncology and his PCP for further evaluation of his lower extremity edema. Final Clinical Impressions(s) / UC Diagnoses   Final diagnoses:  Dependent edema     Discharge Instructions      As discussed, your symptoms are consistent with dependent edema.  There are many likely causes that could be contributing to your symptoms at this time.  Given your medical history, and the "blockage" that was seen in your lower extremities by your PCP, it would be more beneficial for you to follow-up at the [facility] for further evaluation. Recommend TED hose.  You can wear the compression hose during the day for 12 hours and then remove them at night. Keep the legs and feet elevated  as much as possible throughout the day. Try to refrain from sitting or standing for long periods of time. Remain as active as possible. Go to the emergency department immediately if you develop worsening swelling, shortness of breath, difficulty breathing, or other concerns.     ED Prescriptions   None    PDMP not reviewed this encounter.   Tish Men,  NP 09/05/21 1104

## 2021-09-05 NOTE — Discharge Instructions (Signed)
As discussed, your symptoms are consistent with dependent edema.  There are many likely causes that could be contributing to your symptoms at this time.  Given your medical history, and the "blockage" that was seen in your lower extremities by your PCP, it would be more beneficial for you to follow-up at the [facility] for further evaluation. Recommend TED hose.  You can wear the compression hose during the day for 12 hours and then remove them at night. Keep the legs and feet elevated as much as possible throughout the day. Try to refrain from sitting or standing for long periods of time. Remain as active as possible. Go to the emergency department immediately if you develop worsening swelling, shortness of breath, difficulty breathing, or other concerns.

## 2021-09-11 ENCOUNTER — Inpatient Hospital Stay (HOSPITAL_COMMUNITY): Payer: Medicare (Managed Care) | Attending: Hematology | Admitting: Hematology

## 2021-09-11 VITALS — BP 143/88 | HR 91 | Temp 97.9°F | Resp 18 | Wt 145.8 lb

## 2021-09-11 DIAGNOSIS — Z87891 Personal history of nicotine dependence: Secondary | ICD-10-CM | POA: Diagnosis not present

## 2021-09-11 DIAGNOSIS — Z9049 Acquired absence of other specified parts of digestive tract: Secondary | ICD-10-CM | POA: Insufficient documentation

## 2021-09-11 DIAGNOSIS — D649 Anemia, unspecified: Secondary | ICD-10-CM | POA: Insufficient documentation

## 2021-09-11 DIAGNOSIS — C181 Malignant neoplasm of appendix: Secondary | ICD-10-CM | POA: Insufficient documentation

## 2021-09-11 DIAGNOSIS — Z801 Family history of malignant neoplasm of trachea, bronchus and lung: Secondary | ICD-10-CM | POA: Insufficient documentation

## 2021-09-11 NOTE — Progress Notes (Signed)
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  BRIEF ONCOLOGIC HISTORY:  Oncology History   No history exists.    CANCER STAGING: Cancer Staging  Primary appendiceal adenocarcinoma [facility]) Staging form: Appendix - Carcinoma, AJCC 8th Edition - Clinical stage from 07/29/2021: Stage Unknown (cT3, cNX, cM0, G1) - Unsigned   INTERVAL HISTORY:  [name], a 71 y.o. male, returns for routine follow-up of his primary appendiceal goblet cell adenocarcinoma. Blake Rodriguez was last seen on 07/29/2021.   Today he reports feeling good. His appetite is good, and he reports he has been eating well. He started 1 iron tablet daily, and his energy has improved.   REVIEW OF SYSTEMS:  Review of Systems  Constitutional:  Negative for appetite change and fatigue.  Psychiatric/Behavioral:  Positive for depression. The patient is nervous/anxious.   All other systems reviewed and are negative.   PAST MEDICAL/SURGICAL HISTORY:  Past Medical History:  Diagnosis Date   Arthritis    Diabetes mellitus without complication ([place])    Hypertension    Lung collapse    Past Surgical History:  Procedure Laterality Date   BOWEL RESECTION N/A 08/11/2021   Procedure: EN BLOC RESECTION, RIGHT HEMICOLECTOMY, ADDITIONAL SMALL BOWEL RESECTION;  Surgeon: [name], MD;  Location: [facility];  Service: General;  Laterality: N/A;   IR RADIOLOGIST EVAL & MGMT  05/28/2021   IR RADIOLOGIST EVAL & MGMT  06/11/2021   LAPAROSCOPIC APPENDECTOMY N/A 06/26/2021   Procedure: APPENDECTOMY LAPAROSCOPIC ;  Surgeon: [name], MD;  Location: [facility];  Service: General;  Laterality: N/A;   LUNG  BIOPSY  06/10/1983   SHOULDER ARTHROCENTESIS Left     SOCIAL HISTORY:  Social History   Socioeconomic History   Marital status: Married    Spouse name: Not on file   Number of children: Not on file   Years of education: Not on file   Highest education level: Not on file  Occupational History   Not on file  Tobacco Use   Smoking status: Former    Packs/day: 0.50    Years: 30.00    Total pack years: 15.00    Types: Cigarettes    Quit date: 06/04/1993    Years since quitting: 28.2   Smokeless tobacco: Never  Vaping Use   Vaping Use: Never used  Substance and Sexual Activity   Alcohol use: Not Currently    Comment: occassional   Drug use: Never   Sexual activity: Not on file  Other Topics Concern   Not on file  Social History Narrative   Not on file   Social Determinants of Health   Financial Resource Strain: Not on file  Food Insecurity: Not on file  Transportation Needs: Not on file  Physical Activity: Not on file  Stress: Not on file  Social Connections: Not on file  Intimate Partner Violence: Not on file    FAMILY HISTORY:  Family History  Problem Relation Age of Onset   Lung cancer Brother     CURRENT MEDICATIONS:  Current Outpatient Medications  Medication Sig Dispense Refill   acetaminophen (TYLENOL) 325 MG tablet Take 2 tablets (650 mg total) by mouth every 6 (six)  hours as needed for mild pain, fever or headache. 12 tablet 0   Alogliptin Benzoate 12.5 MG TABS Take 12.5 mg by mouth in the morning.     atorvastatin (LIPITOR) 80 MG tablet Take 80 mg by mouth every evening.     Cholecalciferol (VITAMIN D3 PO) Take 2 capsules by mouth daily.     Cholecalciferol 50 MCG (2000 UT) TABS Take 2 tablets by mouth daily.     cyanocobalamin 100 MCG tablet Take 3 tablets by mouth daily.     empagliflozin (JARDIANCE) 25 MG TABS tablet Take 25 mg by mouth in the morning.     escitalopram (LEXAPRO) 10 MG tablet Take 10 mg by mouth at bedtime.     ezetimibe (ZETIA) 10  MG tablet Take 10 mg by mouth in the morning.     metFORMIN (GLUCOPHAGE-XR) 500 MG 24 hr tablet Take 500 mg by mouth every evening.     metoprolol tartrate (LOPRESSOR) 25 MG tablet Take 1 tablet (25 mg total) by mouth 2 (two) times daily. 60 tablet 1   NIFEdipine (PROCARDIA XL/NIFEDICAL-XL) 90 MG 24 hr tablet Take 1 tablet (90 mg total) by mouth in the morning. 30 tablet 0   ondansetron (ZOFRAN) 4 MG tablet Take 1 tablet (4 mg total) by mouth every 8 (eight) hours as needed. 30 tablet 1   oxyCODONE (ROXICODONE) 5 MG immediate release tablet Take 1 tablet (5 mg total) by mouth every 4 (four) hours as needed for breakthrough pain or severe pain. 15 tablet 0   pioglitazone (ACTOS) 15 MG tablet Take 15 mg by mouth daily.     tiZANidine (ZANAFLEX) 4 MG tablet Take 4 mg by mouth at bedtime as needed for muscle spasms.     No current facility-administered medications for this visit.    ALLERGIES:  No Known Allergies  PHYSICAL EXAM:  Performance status (ECOG): 1 - Symptomatic but completely ambulatory  There were no vitals filed for this visit. Wt Readings from Last 3 Encounters:  09/04/21 140 lb (63.5 kg)  08/27/21 140 lb (63.5 kg)  08/16/21 134 lb 7.7 oz (61 kg)   Physical Exam Vitals reviewed.  Constitutional:      Appearance: Normal appearance.  Cardiovascular:     Rate and Rhythm: Normal rate and regular rhythm.     Pulses: Normal pulses.     Heart sounds: Normal heart sounds.  Pulmonary:     Effort: Pulmonary effort is normal.     Breath sounds: Normal breath sounds.  Neurological:     General: No focal deficit present.     Mental Status: He is alert and oriented to person, place, and time.  Psychiatric:        Mood and Affect: Mood normal.        Behavior: Behavior normal.      LABORATORY DATA:  I have reviewed the labs as listed.     Latest Ref Rng & Units 08/18/2021    4:35 AM 08/17/2021    1:49 PM 08/17/2021    7:03 AM  CBC  WBC 4.0 - 10.5 K/uL 11.0   11.7    Hemoglobin 13.0 - 17.0 g/dL 8.2  8.5  8.4   Hematocrit 39.0 - 52.0 % 25.6  26.2  25.8   Platelets 150 - 400 K/uL 375   371       Latest Ref Rng & Units 08/18/2021    4:35 AM 08/17/2021    5:54 AM 08/16/2021    2:54 PM  CMP  Glucose 70 - 99 mg/dL 150  141  165   BUN 8 - 23 mg/dL '8  11  11   '$ Creatinine 0.61 - 1.24 mg/dL 1.00  1.19  1.25   Sodium 135 - 145 mmol/L 141  142  144   Potassium 3.5 - 5.1 mmol/L 3.0  4.0  3.7   Chloride 98 - 111 mmol/L 107  109  108   CO2 22 - 32 mmol/L '27  23  23   '$ Calcium 8.9 - 10.3 mg/dL 8.1  8.2  8.3   Total Protein 6.5 - 8.1 g/dL  5.9  5.8   Total Bilirubin 0.3 - 1.2 mg/dL  1.6  1.4   Alkaline Phos 38 - 126 U/L  53  57   AST 15 - 41 U/L  55  58   ALT 0 - 44 U/L  29  30     DIAGNOSTIC IMAGING:  I have independently reviewed the scans and discussed with the patient. No results found.   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  Multiple kidney cysts.  No evidence of metastatic disease. - CT [facility] on 05/16/2021: Interval development of multiple fluid collections in the pelvis compatible with abscesses in the setting of perforated appendicitis.  Abscesses about the ileum, sigmoid colon and urinary bladder. - Drain placement on 05/20/2021 - CT [facility] on 05/28/2021: Significant interval improvement of periappendiceal inflammation and near complete resolution of abscess. - Laparoscopic appendectomy by Dr. Constance Haw on 06/26/2021 - CTAP (07/04/2021): Large fluid collection/abscess in the lower abdomen abutting the cecum and appendectomy suture. - Pathology:  Low-grade goblet cell adenocarcinoma.  Acute appendicitis.  Perforation not identified.  Grade 1, well-differentiated, tumor size 4.7 cm.  Positive proximal margin.  PT3PNX.    Social/family history: - He lives at home with his wife.  He is independent of ADLs  and IADLs.  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  Quit smoking in 1997. - Brother had lung cancer.    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 - CEA and CA 19-9 were normal. - From pathology it appears to be atypical GCA (Tang A) with good prognosis. - Reviewed pathology report from 08/11/2021 which did not show any residual tumor.  0/14 lymph nodes involved. - I did not recommend any adjuvant chemotherapy as he did not have any high risk features. - We will follow the surveillance of colon cancer with every 3 months labs and every 6 months CT scan for the first 2 years. - RTC 4 to 5 weeks with repeat CEA, CA 19-9 and CTAP with contrast.  2.  Normocytic anemia: - CBC on 08/18/2021 with hemoglobin 8.2 and MCV 93.  He had received 2 units PRBC during surgery. - He started iron tablet daily which she is tolerating well. - We will check ferritin, iron panel, S93 and folic acid prior to next visit.   Orders placed this encounter:  No orders of the defined types were placed in this encounter.    Derek Jack, MD [place] [phone]   I, Thana Ates, am acting as a scribe for Dr. Derek Jack.  I, Derek Jack MD, have reviewed the above documentation for accuracy and completeness, and I  agree with the above.

## 2021-09-12 ENCOUNTER — Other Ambulatory Visit (HOSPITAL_COMMUNITY): Payer: Self-pay | Admitting: *Deleted

## 2021-09-12 DIAGNOSIS — C181 Malignant neoplasm of appendix: Secondary | ICD-10-CM

## 2021-09-12 DIAGNOSIS — D649 Anemia, unspecified: Secondary | ICD-10-CM

## 2021-10-16 ENCOUNTER — Inpatient Hospital Stay: Payer: Medicare PPO | Attending: Hematology

## 2021-10-16 ENCOUNTER — Ambulatory Visit (HOSPITAL_COMMUNITY)
Admission: RE | Admit: 2021-10-16 | Discharge: 2021-10-16 | Disposition: A | Discharge status: Home / Self Care | Payer: Medicare PPO | Source: Ambulatory Visit | Attending: Hematology | Admitting: Hematology

## 2021-10-16 DIAGNOSIS — C181 Malignant neoplasm of appendix: Secondary | ICD-10-CM | POA: Insufficient documentation

## 2021-10-16 DIAGNOSIS — N281 Cyst of kidney, acquired: Secondary | ICD-10-CM | POA: Diagnosis not present

## 2021-10-16 DIAGNOSIS — D649 Anemia, unspecified: Secondary | ICD-10-CM | POA: Insufficient documentation

## 2021-10-16 DIAGNOSIS — Z8509 Personal history of malignant neoplasm of other digestive organs: Secondary | ICD-10-CM | POA: Diagnosis not present

## 2021-10-16 DIAGNOSIS — K5900 Constipation, unspecified: Secondary | ICD-10-CM | POA: Insufficient documentation

## 2021-10-16 DIAGNOSIS — Z79899 Other long term (current) drug therapy: Secondary | ICD-10-CM | POA: Insufficient documentation

## 2021-10-16 DIAGNOSIS — Z801 Family history of malignant neoplasm of trachea, bronchus and lung: Secondary | ICD-10-CM | POA: Insufficient documentation

## 2021-10-16 DIAGNOSIS — C111 Malignant neoplasm of posterior wall of nasopharynx: Secondary | ICD-10-CM | POA: Diagnosis not present

## 2021-10-16 DIAGNOSIS — Z87891 Personal history of nicotine dependence: Secondary | ICD-10-CM | POA: Insufficient documentation

## 2021-10-16 LAB — COMPREHENSIVE METABOLIC PANEL
ALT: 15 U/L (ref 0–44)
AST: 22 U/L (ref 15–41)
Albumin: 3.8 g/dL (ref 3.5–5.0)
Alkaline Phosphatase: 70 U/L (ref 38–126)
Anion gap: 7 (ref 5–15)
BUN: 15 mg/dL (ref 8–23)
CO2: 27 mmol/L (ref 22–32)
Calcium: 9.3 mg/dL (ref 8.9–10.3)
Chloride: 106 mmol/L (ref 98–111)
Creatinine, Ser: 1.31 mg/dL — ABNORMAL HIGH (ref 0.61–1.24)
GFR, Estimated: 59 mL/min — ABNORMAL LOW (ref >60)
Glucose, Bld: 120 mg/dL — ABNORMAL HIGH (ref 70–99)
Potassium: 3 mmol/L — ABNORMAL LOW (ref 3.5–5.1)
Sodium: 140 mmol/L (ref 135–145)
Total Bilirubin: 0.5 mg/dL (ref 0.3–1.2)
Total Protein: 7.8 g/dL (ref 6.5–8.1)

## 2021-10-16 LAB — CBC WITH DIFFERENTIAL/PLATELET
Abs Immature Granulocytes: 0.01 10*3/uL (ref 0.00–0.07)
Basophils Absolute: 0 10*3/uL (ref 0.0–0.1)
Basophils Relative: 1 %
Eosinophils Absolute: 0.1 10*3/uL (ref 0.0–0.5)
Eosinophils Relative: 1 %
HCT: 42 % (ref 39.0–52.0)
Hemoglobin: 13.4 g/dL (ref 13.0–17.0)
Immature Granulocytes: 0 %
Lymphocytes Relative: 41 %
Lymphs Abs: 2.8 10*3/uL (ref 0.7–4.0)
MCH: 28.6 pg (ref 26.0–34.0)
MCHC: 31.9 g/dL (ref 30.0–36.0)
MCV: 89.7 fL (ref 80.0–100.0)
Monocytes Absolute: 0.5 10*3/uL (ref 0.1–1.0)
Monocytes Relative: 7 %
Neutro Abs: 3.5 10*3/uL (ref 1.7–7.7)
Neutrophils Relative %: 50 %
Platelets: 319 10*3/uL (ref 150–400)
RBC: 4.68 MIL/uL (ref 4.22–5.81)
RDW: 15.6 % — ABNORMAL HIGH (ref 11.5–15.5)
WBC: 6.9 10*3/uL (ref 4.0–10.5)
nRBC: 0 % (ref 0.0–0.2)

## 2021-10-16 LAB — IRON AND TIBC
Iron: 54 ug/dL (ref 45–182)
Saturation Ratios: 17 % — ABNORMAL LOW (ref 17.9–39.5)
TIBC: 323 ug/dL (ref 250–450)
UIBC: 269 ug/dL

## 2021-10-16 LAB — VITAMIN B12: Vitamin B-12: 204 pg/mL (ref 180–914)

## 2021-10-16 LAB — FOLATE: Folate: 7.9 ng/mL (ref >5.9)

## 2021-10-16 LAB — FERRITIN: Ferritin: 101 ng/mL (ref 24–336)

## 2021-10-16 MED ORDER — IOHEXOL 300 MG/ML  SOLN
100.0000 mL | Freq: Once | INTRAMUSCULAR | Status: AC | PRN
Start: 2021-10-16 — End: 2021-10-16
  Administered 2021-10-16: 100 mL via INTRAVENOUS

## 2021-10-17 LAB — CEA: CEA: 3.2 ng/mL (ref 0.0–4.7)

## 2021-10-17 LAB — CANCER ANTIGEN 19-9: CA 19-9: 7 U/mL (ref 0–35)

## 2021-10-20 ENCOUNTER — Inpatient Hospital Stay (HOSPITAL_BASED_OUTPATIENT_CLINIC_OR_DEPARTMENT_OTHER): Payer: Medicare PPO | Admitting: Hematology

## 2021-10-20 VITALS — BP 154/75 | HR 93 | Temp 97.4°F | Resp 18 | Ht 70.0 in | Wt 148.1 lb

## 2021-10-20 DIAGNOSIS — C181 Malignant neoplasm of appendix: Secondary | ICD-10-CM | POA: Diagnosis not present

## 2021-10-20 DIAGNOSIS — Z801 Family history of malignant neoplasm of trachea, bronchus and lung: Secondary | ICD-10-CM | POA: Diagnosis not present

## 2021-10-20 DIAGNOSIS — K5900 Constipation, unspecified: Secondary | ICD-10-CM | POA: Diagnosis not present

## 2021-10-20 DIAGNOSIS — Z87891 Personal history of nicotine dependence: Secondary | ICD-10-CM | POA: Diagnosis not present

## 2021-10-20 DIAGNOSIS — Z79899 Other long term (current) drug therapy: Secondary | ICD-10-CM | POA: Diagnosis not present

## 2021-10-20 DIAGNOSIS — D649 Anemia, unspecified: Secondary | ICD-10-CM | POA: Diagnosis not present

## 2021-10-20 NOTE — Patient Instructions (Signed)
[place] at [facility] Discharge Instructions  You were seen and examined today by Dr. Delton Coombes.  Dr. Delton Coombes discussed your most recent lab work and CT scan which revealed that everything looks good.  Follow-up as needed.    Thank you for choosing [place] at [facility] to provide your oncology and hematology care.  To afford each patient quality time with our provider, please arrive at least 15 minutes before your scheduled appointment time.   If you have a lab appointment with the [place] please come in thru the Main Entrance and check in at the main information desk.  You need to re-schedule your appointment should you arrive 10 or more minutes late.  We strive to give you quality time with our providers, and arriving late affects you and other patients whose appointments are after yours.  Also, if you no show three or more times for appointments you may be dismissed from the clinic at the providers discretion.     Again, thank you for choosing [facility].  Our hope is that these requests will decrease the amount of time that you wait before being seen by our physicians.       _____________________________________________________________  Should you have questions after your visit to [facility], please contact our office at [phone] and follow the prompts.  Our office hours are 8:00 a.m. and 4:30 p.m. Monday - Friday.  Please note that voicemails left after 4:00 p.m. may not be returned until the following business day.  We are closed weekends and major holidays.  You do have access to a nurse 24-7, just call the main number to the clinic [phone] and do not press any options, hold on the line and a nurse will answer the phone.    For prescription refill requests, have your pharmacy contact our office and allow 72 hours.

## 2021-10-20 NOTE — Progress Notes (Signed)
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  BRIEF ONCOLOGIC HISTORY:  Oncology History   No history exists.    CANCER STAGING:  Cancer Staging  Primary appendiceal adenocarcinoma [facility] 2) Staging form: Appendix - Carcinoma, AJCC 8th Edition - Clinical stage from 07/29/2021: Stage Unknown (cT3, cNX, cM0, G1) - Unsigned   INTERVAL HISTORY:  Blake Rodriguez, a 71 y.o. male, returns for follow-up of appendiceal adenocarcinoma.  He is occasional constipation.  Energy and appetite levels are 100%.  No change in bowel movements.  REVIEW OF SYSTEMS:  Review of Systems  Constitutional:  Negative for appetite change and fatigue.  Gastrointestinal:  Positive for constipation.  Psychiatric/Behavioral:  Positive for depression. The patient is nervous/anxious.   All other systems reviewed and are negative.   PAST MEDICAL/SURGICAL HISTORY:  Past Medical History:  Diagnosis Date   Arthritis    Diabetes mellitus without complication ([place])    Hypertension    Lung collapse    Past Surgical History:  Procedure Laterality Date   BOWEL RESECTION N/A 08/11/2021   Procedure: EN BLOC RESECTION, RIGHT HEMICOLECTOMY, ADDITIONAL SMALL BOWEL RESECTION;  Surgeon: [name], MD;  Location: [facility];  Service: General;  Laterality: N/A;   IR RADIOLOGIST EVAL & MGMT  05/28/2021   IR RADIOLOGIST EVAL & MGMT  06/11/2021   LAPAROSCOPIC APPENDECTOMY N/A 06/26/2021   Procedure: APPENDECTOMY LAPAROSCOPIC ;  Surgeon: [name], MD;  Location: [facility];  Service: General;  Laterality: N/A;   LUNG BIOPSY  06/10/1983   SHOULDER ARTHROCENTESIS Left     SOCIAL HISTORY:   Social History   Socioeconomic History   Marital status: Married    Spouse name: Not on file   Number of children: Not on file   Years of education: Not on file   Highest education level: Not on file  Occupational History   Not on file  Tobacco Use   Smoking status: Former    Packs/day: 0.50    Years: 30.00    Total pack years: 15.00    Types: Cigarettes    Quit date: 06/04/1993    Years since quitting: 28.3   Smokeless tobacco: Never  Vaping Use   Vaping Use: Never used  Substance and Sexual Activity   Alcohol use: Not Currently    Comment: occassional   Drug use: Never   Sexual activity: Not on file  Other Topics Concern   Not on file  Social History Narrative   Not on file   Social Determinants of Health   Financial Resource Strain: Not on file  Food Insecurity: Not on file  Transportation Needs: Not on file  Physical Activity: Not on file  Stress: Not on file  Social Connections: Not on file  Intimate Partner Violence: Not on file    FAMILY HISTORY:  Family History  Problem Relation Age of Onset   Lung cancer Brother     CURRENT MEDICATIONS:  Current Outpatient Medications  Medication Sig Dispense Refill   acetaminophen (TYLENOL) 325 MG tablet Take 2 tablets (650 mg total) by mouth every 6 (six) hours as needed for mild pain, fever or headache. 12 tablet 0   atorvastatin (LIPITOR) 80  MG tablet Take 80 mg by mouth every evening.     Cholecalciferol (VITAMIN D3 PO) Take 2 capsules by mouth daily.     Cholecalciferol 50 MCG (2000 UT) TABS Take 2 tablets by mouth daily.     cyanocobalamin 100 MCG tablet Take 3 tablets by mouth daily.     empagliflozin (JARDIANCE) 25 MG TABS tablet Take 25 mg by mouth in the morning.     escitalopram (LEXAPRO) 10 MG tablet Take 10 mg by mouth at bedtime.     ezetimibe (ZETIA) 10 MG tablet Take 10 mg by mouth in the morning.     metFORMIN (GLUCOPHAGE-XR) 500 MG 24 hr tablet Take 500 mg by mouth every evening.     metoprolol  tartrate (LOPRESSOR) 25 MG tablet Take 1 tablet (25 mg total) by mouth 2 (two) times daily. 60 tablet 1   NIFEdipine (PROCARDIA XL/NIFEDICAL-XL) 90 MG 24 hr tablet Take 1 tablet (90 mg total) by mouth in the morning. 30 tablet 0   oxyCODONE (ROXICODONE) 5 MG immediate release tablet Take 1 tablet (5 mg total) by mouth every 4 (four) hours as needed for breakthrough pain or severe pain. 15 tablet 0   pioglitazone (ACTOS) 15 MG tablet Take 15 mg by mouth daily.     tiZANidine (ZANAFLEX) 4 MG tablet Take 4 mg by mouth at bedtime as needed for muscle spasms.     ondansetron (ZOFRAN) 4 MG tablet Take 1 tablet (4 mg total) by mouth every 8 (eight) hours as needed. (Patient not taking: Reported on 10/20/2021) 30 tablet 1   No current facility-administered medications for this visit.    ALLERGIES:  No Known Allergies  PHYSICAL EXAM:  Performance status (ECOG): 1 - Symptomatic but completely ambulatory  Vitals:   10/20/21 1359  BP: (!) 154/75  Pulse: 93  Resp: 18  Temp: (!) 97.4 F (36.3 C)  SpO2: 99%   Wt Readings from Last 3 Encounters:  10/20/21 148 lb 1 oz (67.2 kg)  09/11/21 145 lb 12.8 oz (66.1 kg)  09/04/21 140 lb (63.5 kg)   Physical Exam Vitals reviewed.  Constitutional:      Appearance: Normal appearance.  Cardiovascular:     Rate and Rhythm: Normal rate and regular rhythm.     Pulses: Normal pulses.     Heart sounds: Normal heart sounds.  Pulmonary:     Effort: Pulmonary effort is normal.     Breath sounds: Normal breath sounds.  Neurological:     General: No focal deficit present.     Mental Status: He is alert and oriented to person, place, and time.  Psychiatric:        Mood and Affect: Mood normal.        Behavior: Behavior normal.      LABORATORY DATA:  I have reviewed the labs as listed.     Latest Ref Rng & Units 10/16/2021    2:06 PM 08/18/2021    4:35 AM 08/17/2021    1:49 PM  CBC  WBC 4.0 - 10.5 K/uL 6.9  11.0    Hemoglobin 13.0 - 17.0 g/dL 13.4  8.2   8.5   Hematocrit 39.0 - 52.0 % 42.0  25.6  26.2   Platelets 150 - 400 K/uL 319  375        Latest Ref Rng & Units 10/16/2021    2:06 PM 08/18/2021    4:35 AM 08/17/2021    5:54 AM  CMP  Glucose 70 - 99 mg/dL  120  150  141   BUN 8 - 23 mg/dL '15  8  11   '$ Creatinine 0.61 - 1.24 mg/dL 1.31  1.00  1.19   Sodium 135 - 145 mmol/L 140  141  142   Potassium 3.5 - 5.1 mmol/L 3.0  3.0  4.0   Chloride 98 - 111 mmol/L 106  107  109   CO2 22 - 32 mmol/L '27  27  23   '$ Calcium 8.9 - 10.3 mg/dL 9.3  8.1  8.2   Total Protein 6.5 - 8.1 g/dL 7.8   5.9   Total Bilirubin 0.3 - 1.2 mg/dL 0.5   1.6   Alkaline Phos 38 - 126 U/L 70   53   AST 15 - 41 U/L 22   55   ALT 0 - 44 U/L 15   29     DIAGNOSTIC IMAGING:  I have independently reviewed the scans and discussed with the patient. CT Abdomen Pelvis W Contrast  Result Date: 10/18/2021 CLINICAL DATA:  Primary appendiceal adenocarcinoma, assess treatment response * Tracking Code: BO * EXAM: CT ABDOMEN AND PELVIS WITH CONTRAST TECHNIQUE: Multidetector CT imaging of the abdomen and pelvis was performed using the standard protocol following bolus administration of intravenous contrast. RADIATION DOSE REDUCTION: This exam was performed according to the departmental dose-optimization program which includes automated exposure control, adjustment of the mA and/or kV according to patient size and/or use of iterative reconstruction technique. CONTRAST:  144m OMNIPAQUE IOHEXOL 300 MG/ML  SOLN COMPARISON:  CT abdomen pelvis, 07/04/2021 FINDINGS: Lower chest: No acute abnormality.  Emphysema. Hepatobiliary: No solid liver abnormality is seen. Simple, benign cyst of the inferior right lobe of the liver (series 2, image 25). No gallstones, gallbladder wall thickening, or biliary dilatation. Pancreas: Unremarkable. No pancreatic ductal dilatation or surrounding inflammatory changes. Spleen: Normal in size without significant abnormality. Adrenals/Urinary Tract: Adrenal glands are  unremarkable. Numerous benign bilateral renal cortical cysts, as well as subcentimeter lesions too small to characterize although most likely additional tiny cysts. No further follow-up or characterization is required. Kidneys are otherwise normal, without renal calculi, solid lesion, or hydronephrosis. Bladder is unremarkable. Stomach/Bowel: Stomach is within normal limits. Resection and anastomosis of the mid small bowel in the left hemiabdomen. Status post appendectomy. Interval resolution of a large fluid collection in the low pelvis. No evidence of bowel wall thickening, distention, or inflammatory changes. Large burden of stool throughout the colon and rectum. Vascular/Lymphatic: Aortic atherosclerosis. No enlarged abdominal or pelvic lymph nodes. Reproductive: No mass or other significant abnormality. Other: Fat containing bilateral inguinal hernias.  No ascites. Musculoskeletal: No acute or significant osseous findings. IMPRESSION: 1. Status post appendectomy. Interval resolution of a large fluid collection in the low pelvis. 2. No evidence of lymphadenopathy or metastatic disease in the abdomen or pelvis. 3. Resection and anastomosis of the mid small bowel in the left hemiabdomen. 4. Emphysema. Aortic Atherosclerosis (ICD10-I70.0) and Emphysema (ICD10-J43.9). Electronically Signed   By: ADelanna AhmadiM.D.   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  Multiple kidney cysts.  No evidence of metastatic disease. - CT [facility] on 05/16/2021: Interval development of multiple fluid collections in the pelvis compatible with abscesses in the setting of perforated appendicitis.  Abscesses about the ileum, sigmoid colon and urinary bladder. - Drain placement on 05/20/2021 - CT [facility] on  05/28/2021: Significant interval improvement of  periappendiceal inflammation and near complete resolution of abscess. - Laparoscopic appendectomy by Dr. Constance Haw on 06/26/2021 - CTAP (07/04/2021): Large fluid collection/abscess in the lower abdomen abutting the cecum and appendectomy suture. - Pathology:  Low-grade goblet cell adenocarcinoma.  Acute appendicitis.  Perforation not identified.  Grade 1, well-differentiated, tumor size 4.7 cm.  Positive proximal margin.  PT3PNX. - From pathology it appears to be atypical GCA (Tang A) with good prognosis.   Social/family history: - He lives at home with his wife.  He is independent of ADLs and IADLs.  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  Quit smoking in 1997. - Brother had lung cancer.    PLAN:  Stage II T3 N0 M0 primary appendiceal goblet cell adenocarcinoma: - He does not have any abdominal signs or symptoms of recurrence. - Reviewed labs from 10/16/2021 which showed normal LFTs.  CBC was normal.  CEA and CA 19-9 was normal. - CTAP (10/16/2021): No evidence of lymphadenopathy or metastatic disease.  He would like to follow-up with the [facility] due to insurance coverage issues.  Hence I will not give any follow-up appointment at this time.  But we did discuss follow-up with every 64-monthtumor marker and labs and CT scan every 6 months for first 2 years.  2.  Normocytic anemia: - Ferritin is 101, percent saturation 17.  BM01and folic acid was normal.  Hemoglobin improved to 13.4.   Orders placed this encounter:  No orders of the defined types were placed in this encounter.    SDerek Jack MD [id number] [phone]

## 2021-10-21 ENCOUNTER — Ambulatory Visit (INDEPENDENT_AMBULATORY_CARE_PROVIDER_SITE_OTHER): Payer: No Typology Code available for payment source | Admitting: General Surgery

## 2021-10-21 ENCOUNTER — Encounter: Payer: Self-pay | Admitting: General Surgery

## 2021-10-21 VITALS — BP 137/83 | HR 83 | Temp 97.8°F | Resp 18 | Ht 70.0 in | Wt 147.0 lb

## 2021-10-21 DIAGNOSIS — C181 Malignant neoplasm of appendix: Secondary | ICD-10-CM

## 2021-10-21 NOTE — Patient Instructions (Addendum)
Diet and activity as tolerated.  Follow up with Oncology at the [facility].  Will still need your Colonoscopy when it is due for the remaining part of your colon.

## 2021-10-21 NOTE — Progress Notes (Signed)
[facility]  Doing great. Eating and having no issues.   BP 137/83   Pulse 83   Temp 97.8 F (36.6 C) (Oral)   Resp 18   Ht '5\' 10"'$  (1.778 m)   Wt 147 lb (66.7 kg)   SpO2 97%   BMI 21.09 kg/m m Midline healed, no hernia  Patient s/p right hemicolectomy for primary appendiceal cancer. Oncology has followed and he is transferring his stuff to [place].  Diet and activity as tolerated.  Follow up with Oncology at the [facility].  Will still need your Colonoscopy when it is due for the remaining part of your colon.   Curlene Labrum, MD [facility] [address] [place], [place] [address] [phone] (office)

## 2023-06-13 IMAGING — CT CT ABD-PELV W/ CM
2 of 5 series · 15 of 46 positions shown, 17 images · IV contrast (agent unspecified)
Comparison: No priors.

CLINICAL DATA: 70-year-old male with history of acute onset of
nonlocalized abdominal pain. Nausea and vomiting.

EXAM:
CT ABDOMEN AND PELVIS WITH CONTRAST
TECHNIQUE: Multidetector CT imaging of the abdomen and pelvis was performed
using the standard protocol following bolus administration of
intravenous contrast.

[Series 2: axial st · axial · 0.74mm/px · z∈[+809,+1239]mm · 12 of 96 slices shown, 14 images]
[im 5/96  soft-tissue]
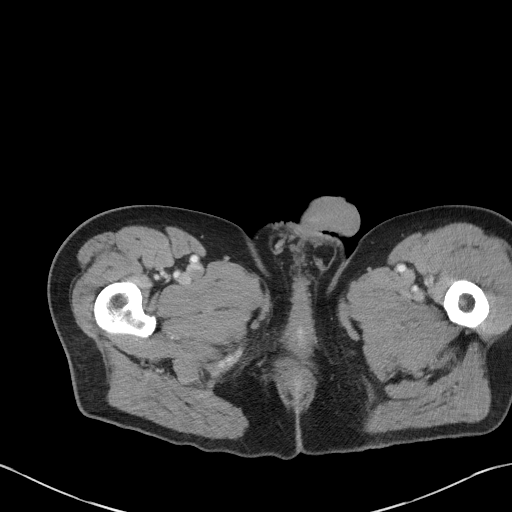
[im 5/96  bone]
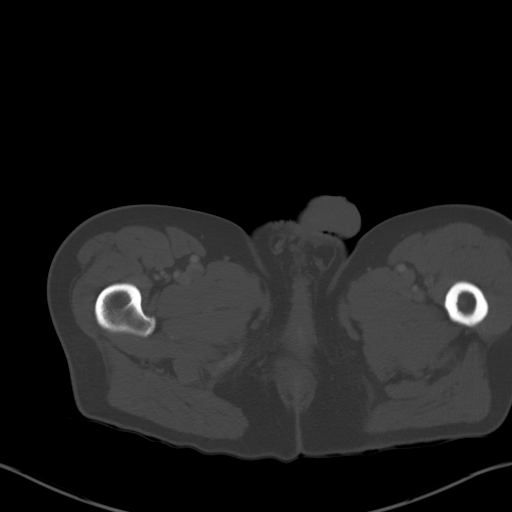
[im 15/96  soft-tissue]
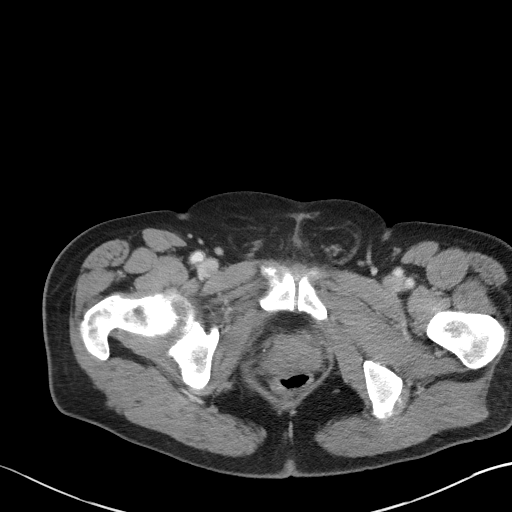
[im 20/96  soft-tissue]
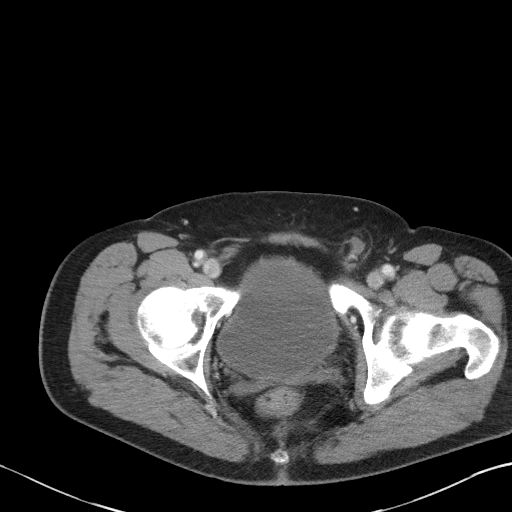
[im 29/96  soft-tissue]
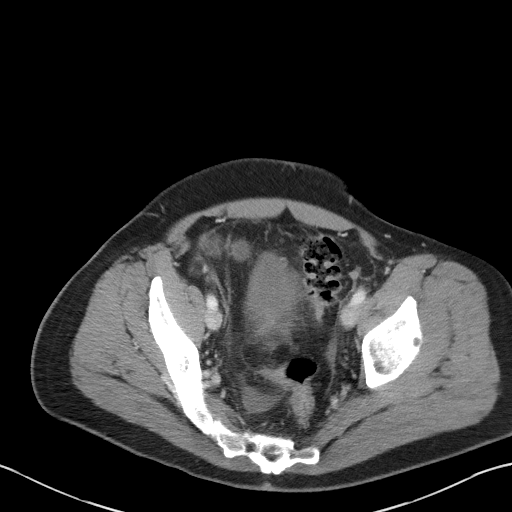
[im 39/96  soft-tissue]
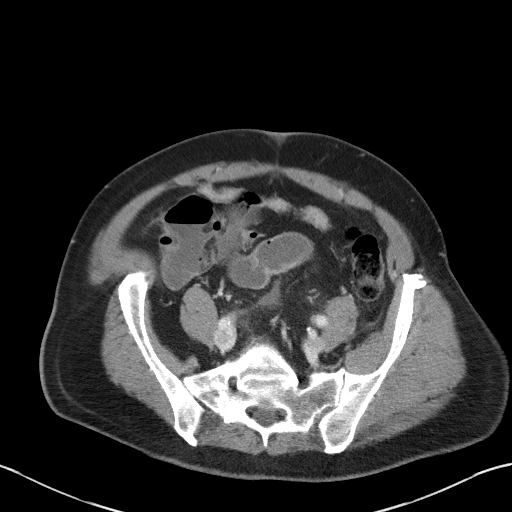
[im 43/96  soft-tissue]
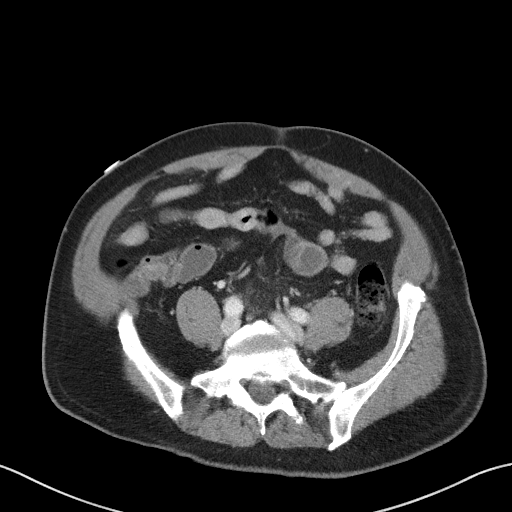
[im 53/96  soft-tissue]
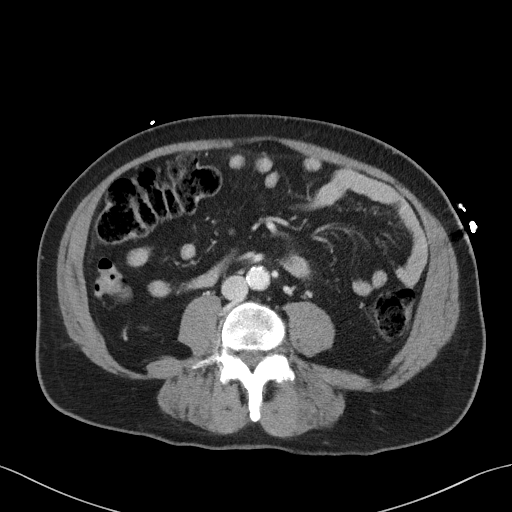
[im 58/96  soft-tissue]
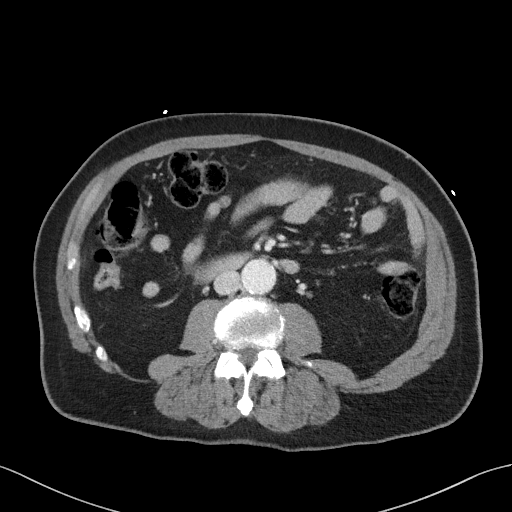
[im 67/96  soft-tissue]
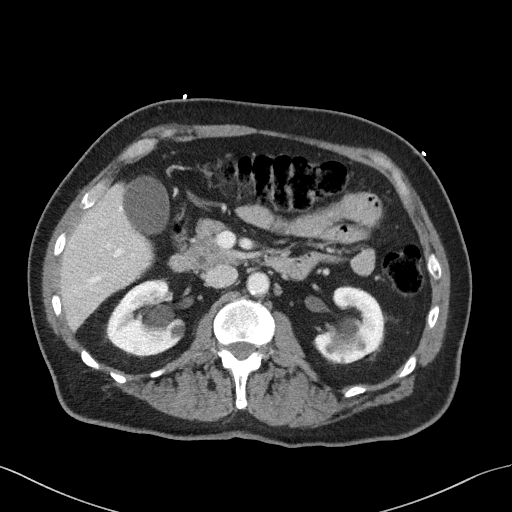
[im 67/96  bone]
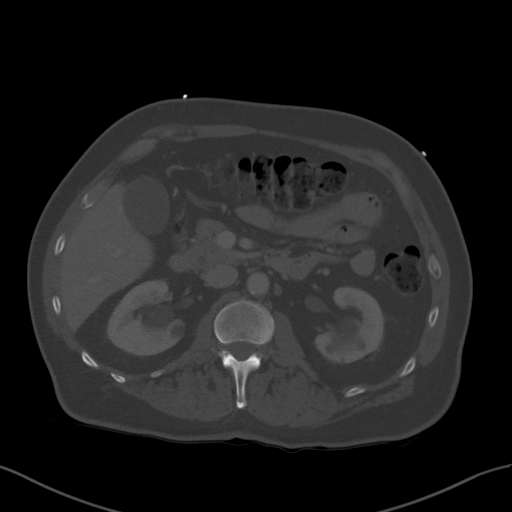
[im 77/96  soft-tissue]
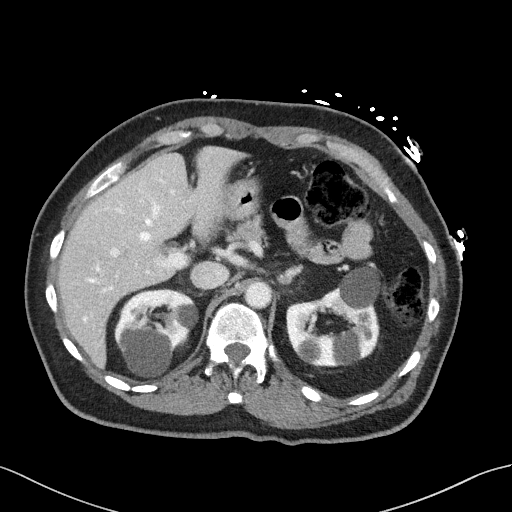
[im 81/96  soft-tissue]
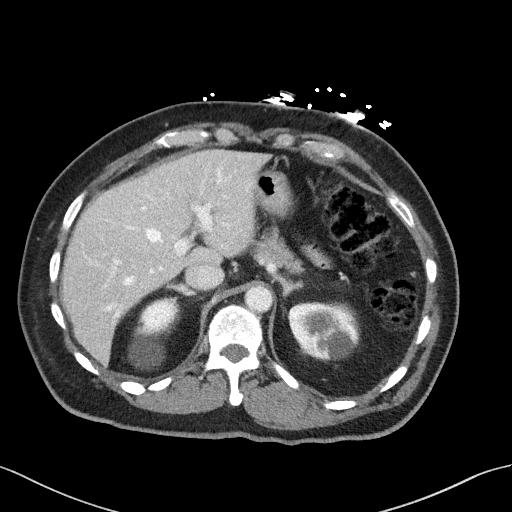
[im 91/96  soft-tissue]
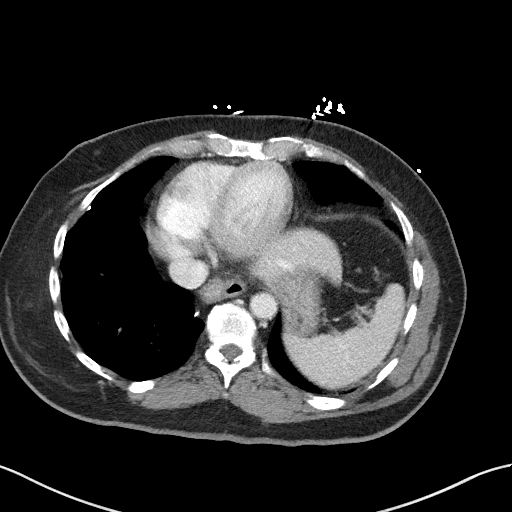

[Series 5: coronal st · coronal · 0.81mm/px · 3 of 107 slices shown]
[im 36/107  soft-tissue]
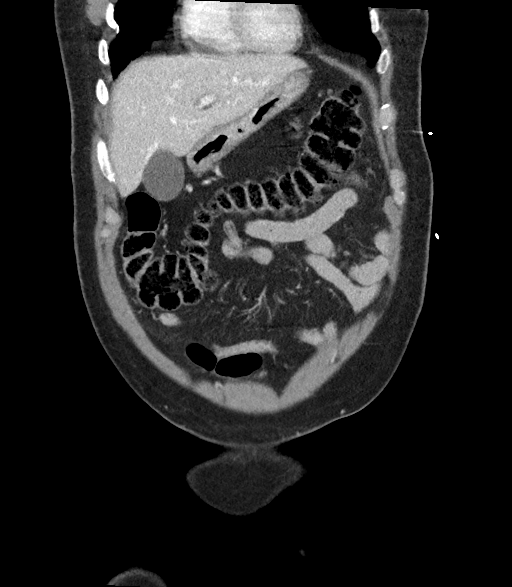
[im 48/107  soft-tissue]
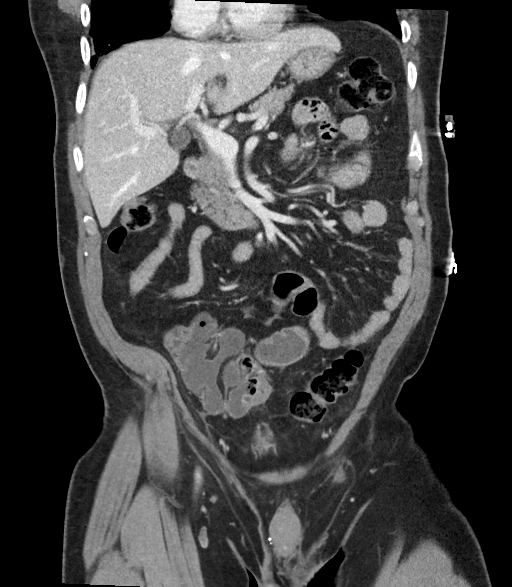
[im 59/107  soft-tissue]
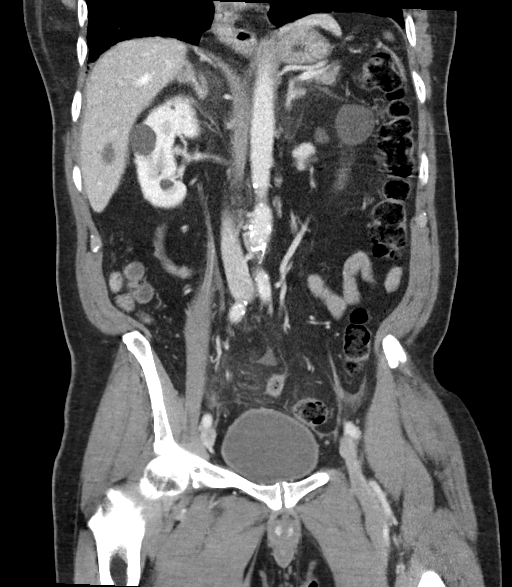

[15 of 46 positions shown; findings below may reference images not displayed]

RADIATION DOSE REDUCTION: This exam was performed according to the
departmental dose-optimization program which includes automated
exposure control, adjustment of the mA and/or kV according to
patient size and/or use of iterative reconstruction technique.

CONTRAST:  100mL OMNIPAQUE IOHEXOL 300 MG/ML  SOLN
FINDINGS: Lower chest: Elevation of the left hemidiaphragm. Mild scarring in
the lung bases.

Hepatobiliary: In segment 6 of the liver (axial image 27 of series
2) there is a 1.9 x 1.5 cm well-defined low-attenuation lesion
compatible with a simple cyst. No other suspicious appearing hepatic
lesions are noted. No intra or extrahepatic biliary ductal
dilatation. Gallbladder is normal in appearance.

Pancreas: No pancreatic mass. No pancreatic ductal dilatation. No
pancreatic or peripancreatic fluid collections or inflammatory
changes.

Spleen: Unremarkable.

Adrenals/Urinary Tract: Multiple low-attenuation lesions in both
kidneys, compatible with simple cysts, largest of which is exophytic
in the upper pole of the right kidney measuring up to 3.9 cm in
diameter. Other lesions are either too small to definitively
characterize or are considered indeterminate. The best example of
this is a lesion in the interpolar region of the right kidney (axial
image 25 of series 2) measuring 2.0 x 2.1 cm which is intermediate
attenuation (29 HU) and slightly irregular in shape. No
hydroureteronephrosis. Urinary bladder is normal in appearance.
Bilateral adrenal glands are normal in appearance.

Stomach/Bowel: The appearance of the stomach is normal. There is no
pathologic dilatation of small bowel or colon. The appendix is
dilated and inflamed, compatible with an acute appendicitis.

Appendix: Location: Posterior and medial to the cecum

Diameter: 12 mm

Appendicolith: None

Mucosal hyper-enhancement: Present

Extraluminal gas: Trace amount

Periappendiceal collection: Small amount of fluid in the
periappendiceal region and in the low anatomic pelvis. At this time,
this appears non organized.

Vascular/Lymphatic: Aortic atherosclerosis with fusiform ectasia of
the infrarenal abdominal aorta which measures up to 2.7 x 2.6 cm. No
lymphadenopathy noted in the abdomen or pelvis.

Reproductive: Prostate gland and seminal vesicles are unremarkable
in appearance.

Other: Small volume of ascites and trace volume of pneumoperitoneum,
most evident in the low anatomic pelvis around the region of the
inflamed appendix.

Musculoskeletal: There are no aggressive appearing lytic or blastic
lesions noted in the visualized portions of the skeleton.
IMPRESSION: 1. Acute perforated appendicitis, as above. Surgical consultation is
strongly recommended.
2. Multiple renal lesions. The majority of these appear to represent
simple cysts. There is one lesion in the interpolar region of the
right kidney which is considered indeterminate (favored to be a
mildly proteinaceous cyst). Follow-up nonemergent abdominal MRI with
and without IV gadolinium is recommended in the near future after
resolution of the patient's acute illness to definitively
characterize this finding and exclude neoplasm.
3. Aortic atherosclerosis with fusiform ectasia of the infrarenal
abdominal aorta which measures up to 2.7 x 2.6 cm.
4. Additional incidental findings, as above.

Critical Value/emergent results were called by telephone at the time
of interpretation on 04/26/2021 at [DATE] to provider KIRI JIM,
who verbally acknowledged these results.

## 2023-06-21 IMAGING — DX DG CERVICAL SPINE COMPLETE 4+V
5 series · 5 of 5 positions shown · non-contrast
Comparison: None.

CLINICAL DATA: 70-year-old male with neck pain and stiffness for 1
week. Right side pain. No recent injury.

EXAM:
CERVICAL SPINE - COMPLETE 4+ VIEW

[cervical spine lat]
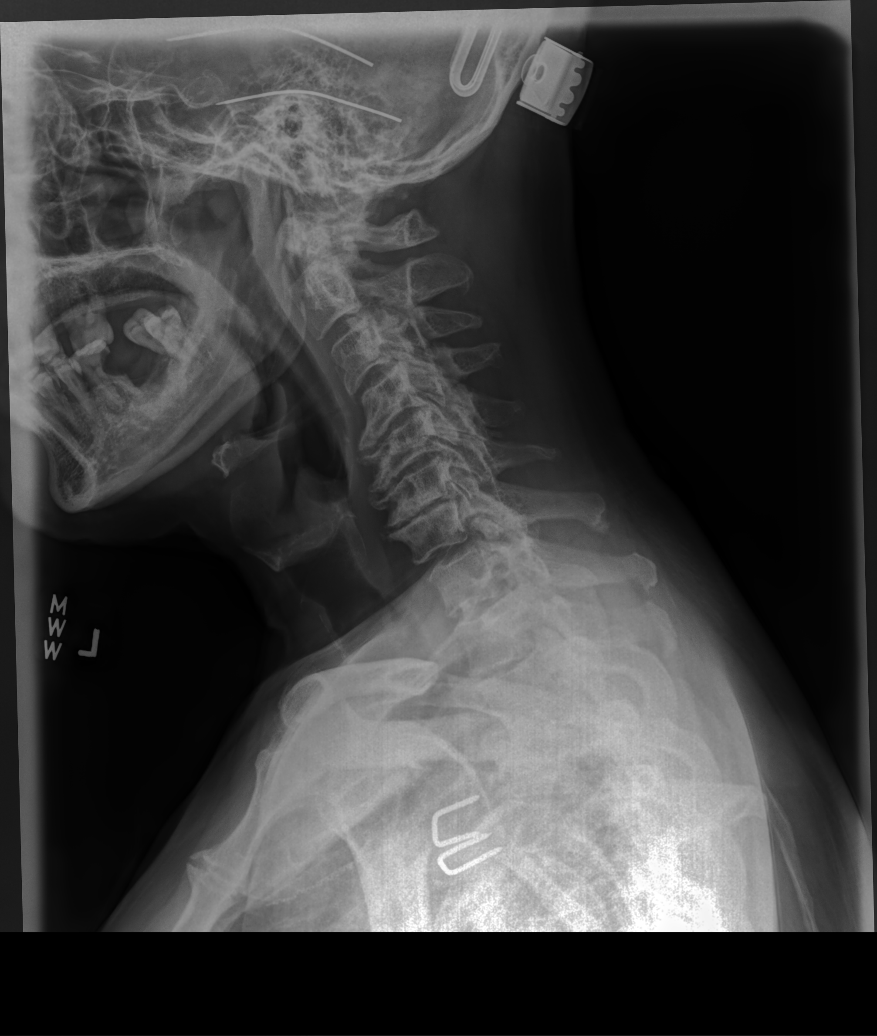

[cervical spine mlo (1 of 2)]
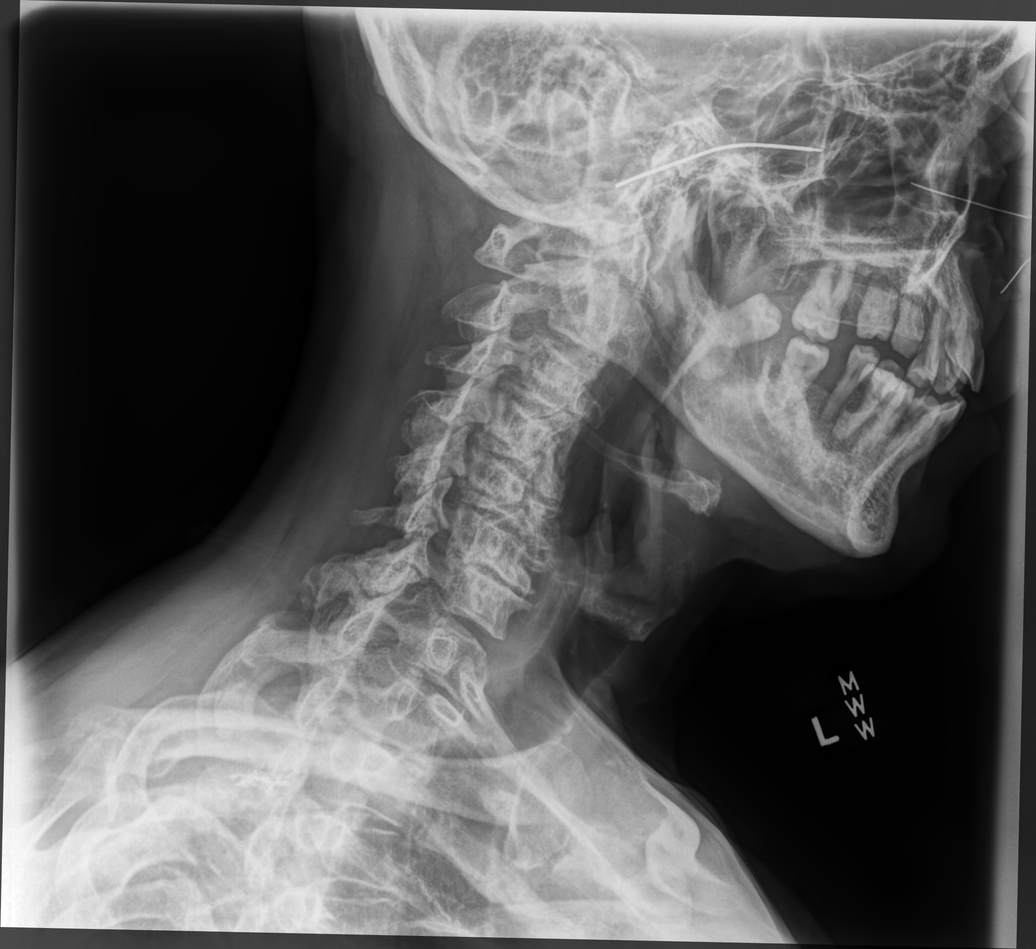

[cervical spine mlo (2 of 2)]
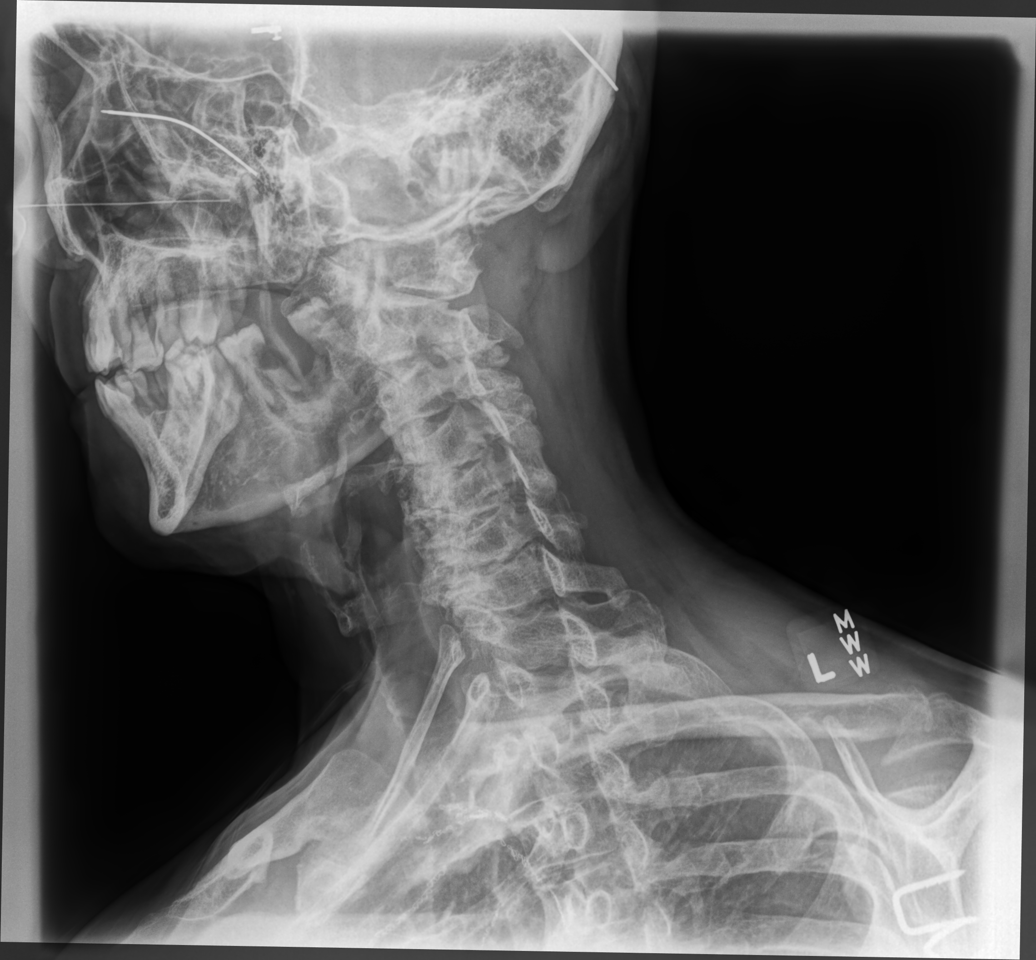

[cervical spine ap (1 of 2)]
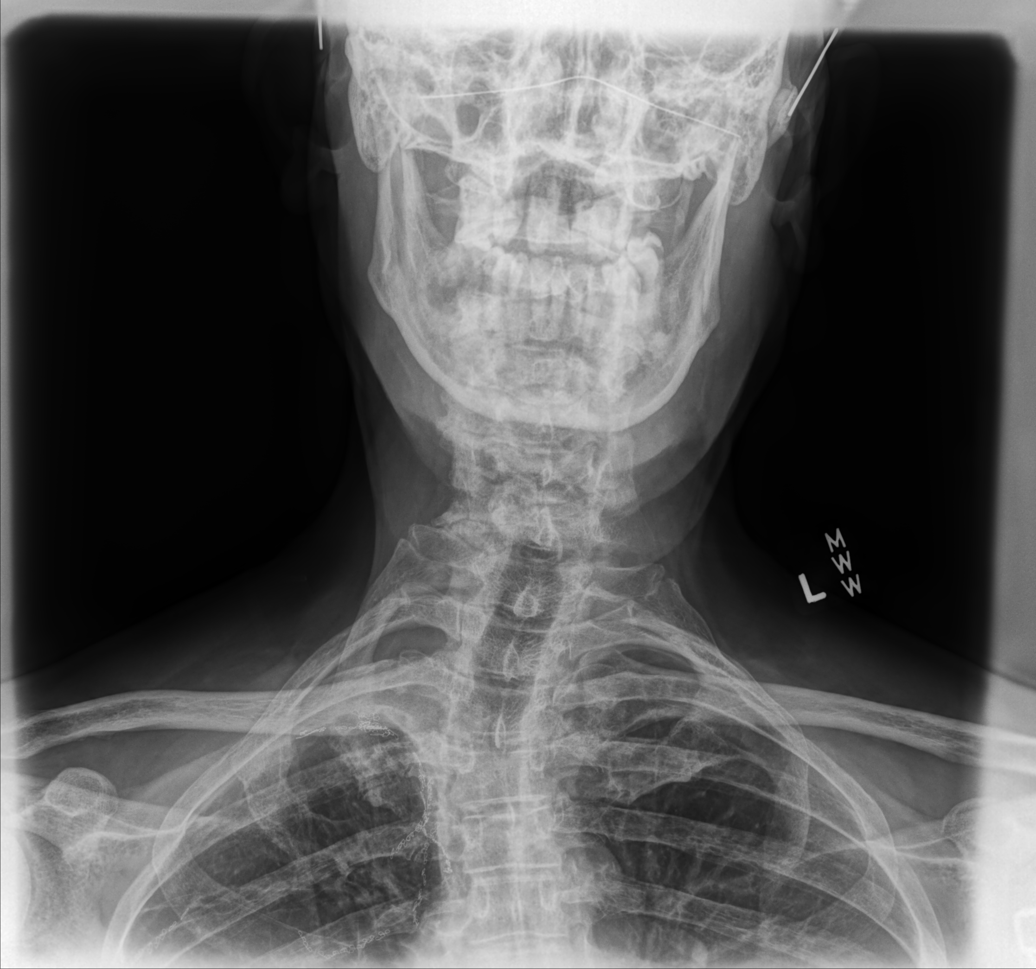

[cervical spine ap (2 of 2)]
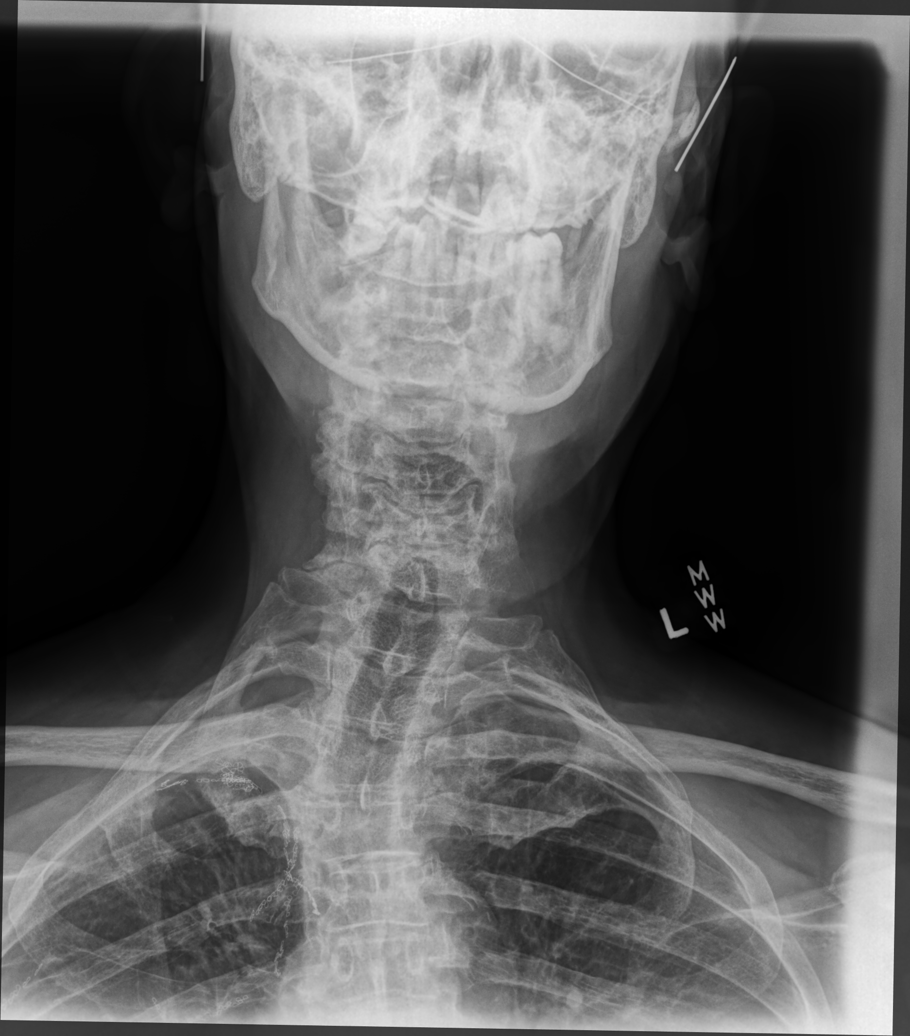

[5 of 5 positions shown; findings below may reference images not displayed]

FINDINGS: Straightening of cervical lordosis with levoconvex scoliosis at the
cervicothoracic junction and dextroconvex upper thoracic scoliosis.
Normal prevertebral soft tissue contour. Mild degenerative appearing
anterolisthesis at C7-T1. Moderate to severe disc space loss C4-C5
through C6-C7, most pronounced at the latter with bulky endplate
spurring. Multilevel moderate right side facet hypertrophy.
Bilateral posterior element alignment is within normal limits. No
acute osseous abnormality identified. Multiple staple lines in the
right lung apex. Left lung apex is clear.
IMPRESSION: 1. No acute osseous abnormality identified in the cervical spine.
2. Scoliosis with advanced cervical degeneration C4-C5 through
C6-C7. Facet arthropathy greater on the right.
3. Postoperative changes right lung apex.

## 2023-08-21 IMAGING — CT CT ABD-PELV W/ CM
2 of 6 series · 15 of 46 positions shown, 17 images · IV contrast (Omnipaque or Isovue)
Comparison: Multiple CT of the abdomen pelvis dating back to
04/26/2021.

CLINICAL DATA: Right lower quadrant abdominal pain. History of
recent perforated appendicitis.

EXAM:
CT ABDOMEN AND PELVIS WITH CONTRAST
TECHNIQUE: Multidetector CT imaging of the abdomen and pelvis was performed
using the standard protocol following bolus administration of
intravenous contrast.

[Series 3: thins · axial · 0.71mm/px · z∈[-622,-185]mm · 12 of 683 slices shown, 14 images]
[im 29/683  soft-tissue]
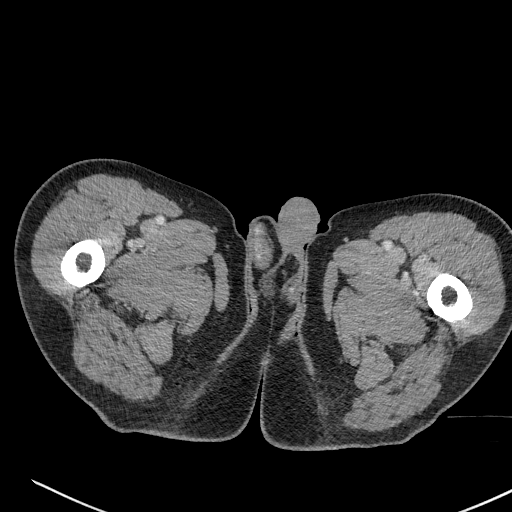
[im 29/683  bone]
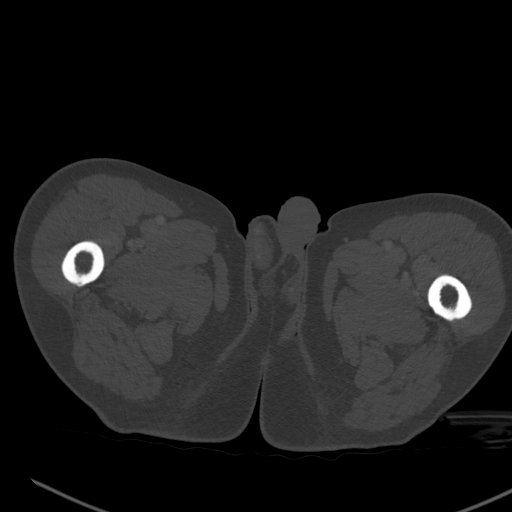
[im 86/683  soft-tissue]
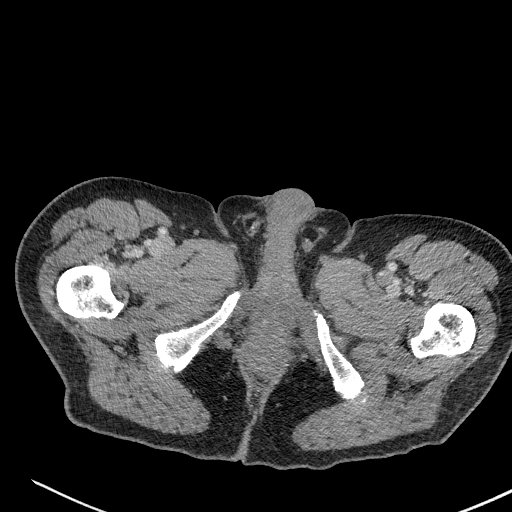
[im 143/683  soft-tissue]
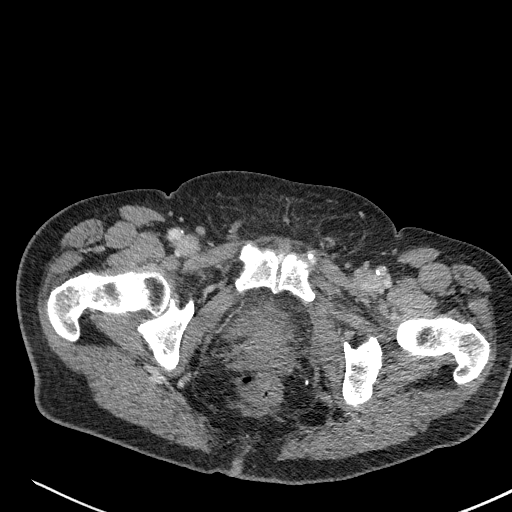
[im 199/683  soft-tissue]
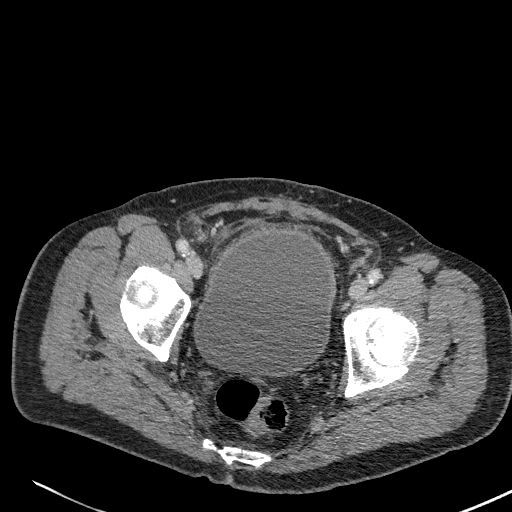
[im 256/683  soft-tissue]
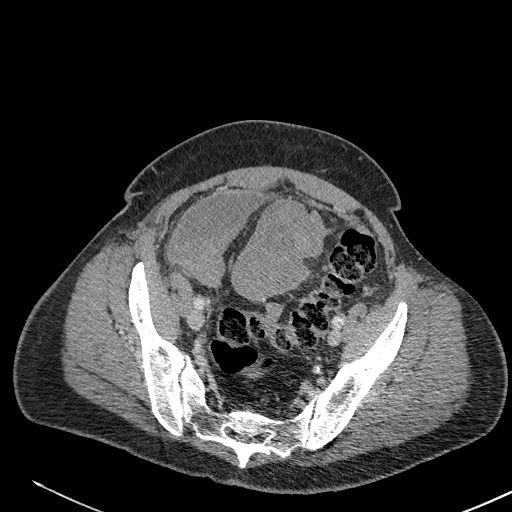
[im 313/683  soft-tissue]
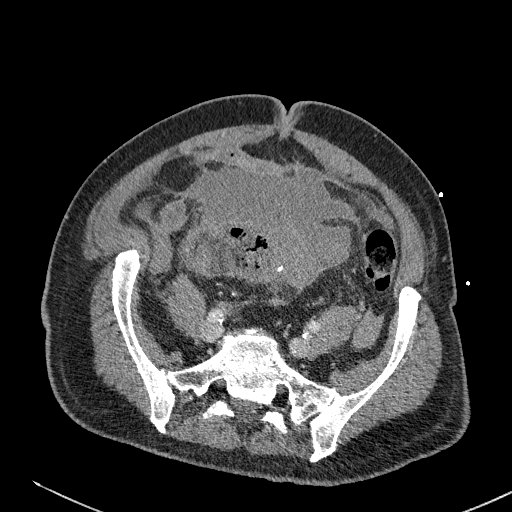
[im 370/683  soft-tissue]
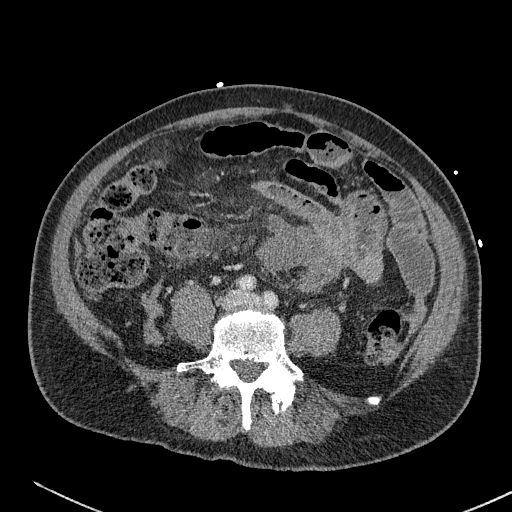
[im 427/683  soft-tissue]
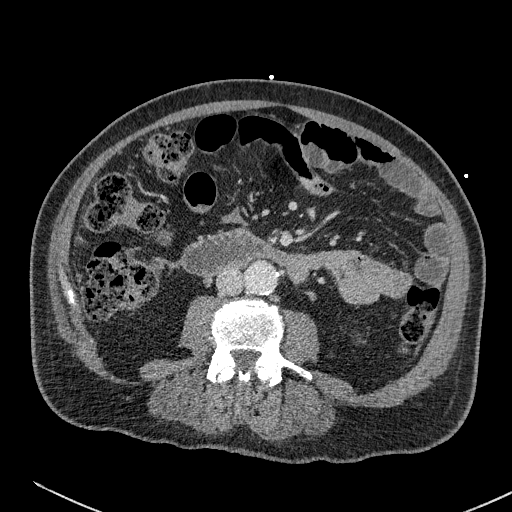
[im 484/683  soft-tissue]
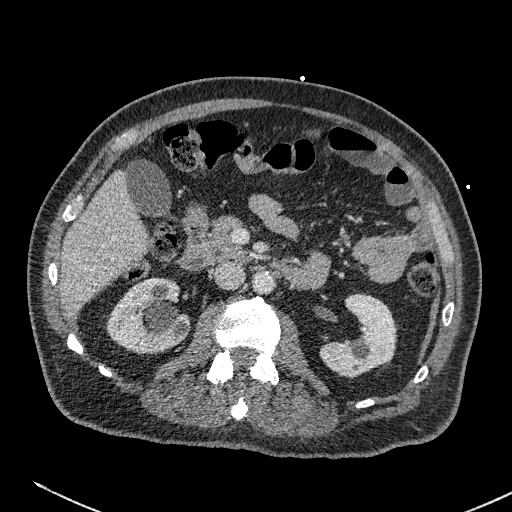
[im 484/683  bone]
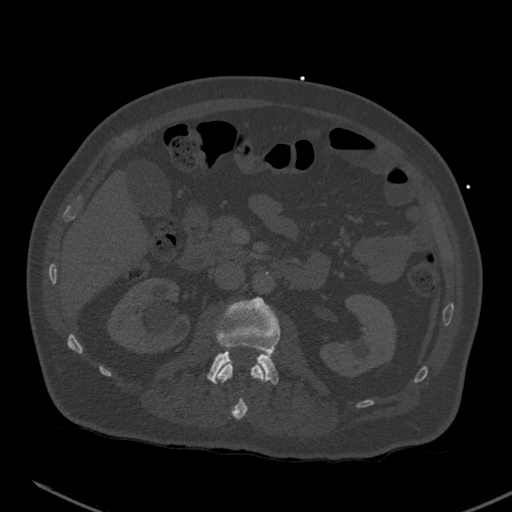
[im 540/683  soft-tissue]
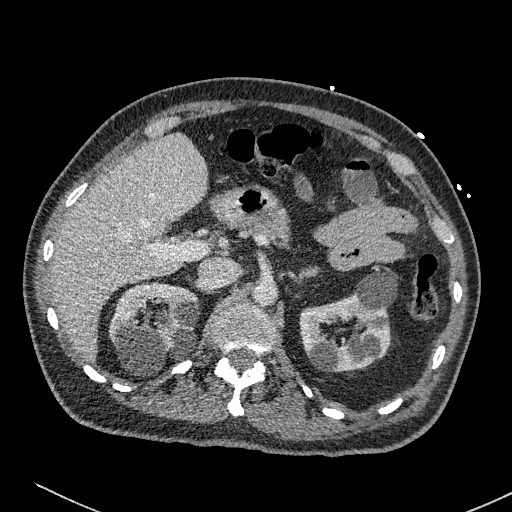
[im 597/683  soft-tissue]
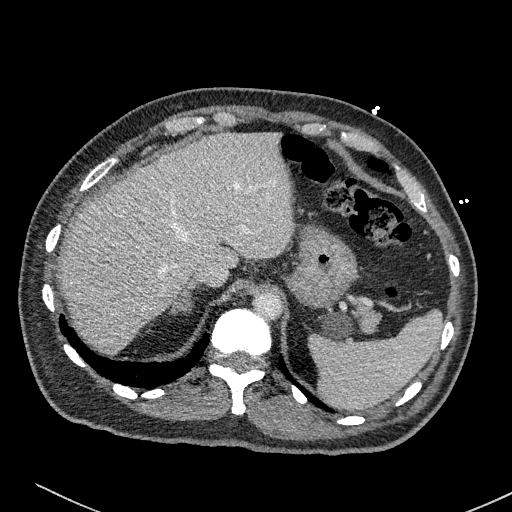
[im 654/683  soft-tissue]
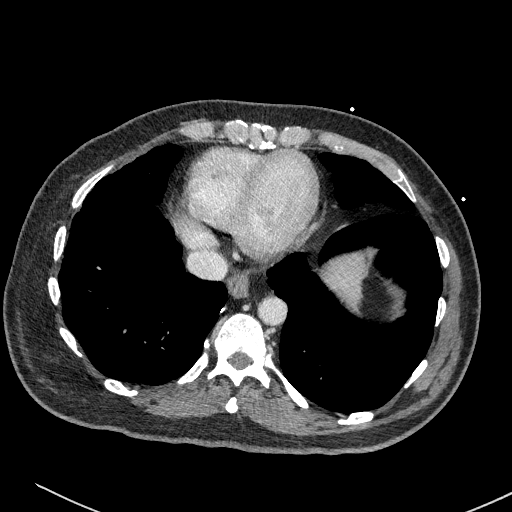

[Series 5: coronal st · coronal · 0.84mm/px · 3 of 109 slices shown]
[im 37/109  soft-tissue]
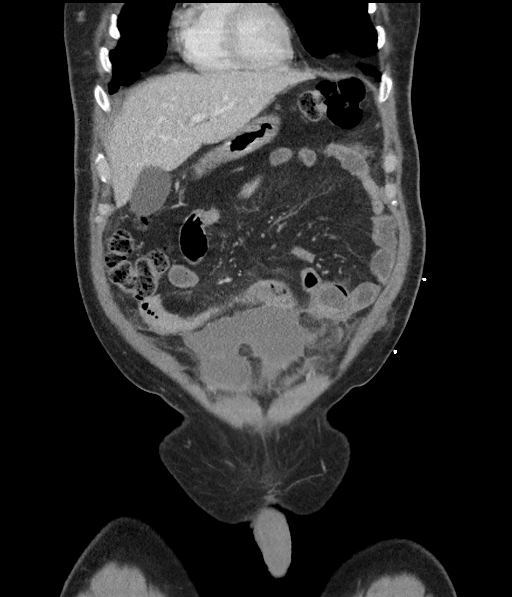
[im 49/109  soft-tissue]
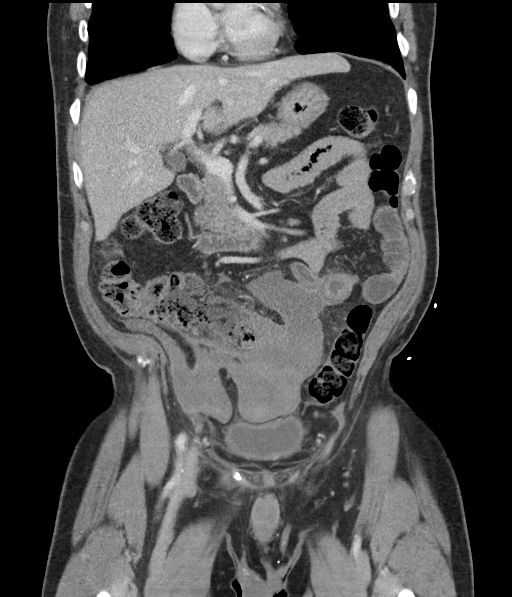
[im 61/109  soft-tissue]
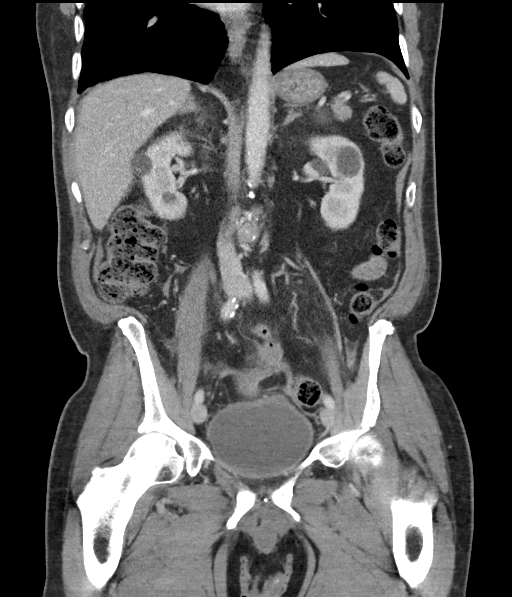

[15 of 46 positions shown; findings below may reference images not displayed]

RADIATION DOSE REDUCTION: This exam was performed according to the
departmental dose-optimization program which includes automated
exposure control, adjustment of the mA and/or kV according to
patient size and/or use of iterative reconstruction technique.

CONTRAST:  75mL OMNIPAQUE IOHEXOL 300 MG/ML  SOLN
FINDINGS: Lower chest: Emphysematous changes of the visualized lung bases.

No intra-abdominal free air. There is a multilobulated fluid
collection containing several small pockets of air in the lower
abdomen abutting the cecum and appendectomy suture. The fluid
collection measures approximately 12 x 7 cm in greatest axial
dimensions and 10 cm in craniocaudal length. There is somewhat
organized wall surrounding this collection which extends into the
right hemipelvis. Findings most consistent with developing abscess.
This may be secondary to sutural dehiscence and leak. Surgical
consult is advised.

Hepatobiliary: Small cyst in the right lobe of the liver. No
intrahepatic biliary ductal dilatation. The gallbladder is
unremarkable.

Pancreas: Unremarkable. No pancreatic ductal dilatation or
surrounding inflammatory changes.

Spleen: Normal in size without focal abnormality.

Adrenals/Urinary Tract: The adrenal glands are unremarkable.
Multiple bilateral renal cysts as seen on the prior CTs. There is no
hydronephrosis on either side. There is symmetric enhancement and
excretion of contrast by both kidneys. The visualized ureters and
the urinary bladder appear unremarkable.

Stomach/Bowel: There is no bowel obstruction. There is moderate
stool throughout the colon. Postsurgical changes of appendectomy.
Mildly thickened appearance of several loops of small bowel in the
lower abdomen, likely reactive to developing abscess.

Vascular/Lymphatic: Mild aortoiliac atherosclerotic disease. There
is a 2.7 cm infrarenal aortic ectasia. The IVC is unremarkable. No
portal venous gas. There is no adenopathy.

Reproductive: The prostate and seminal vesicles are grossly
unremarkable no pelvic mass.

Other: Small fat containing bilateral inguinal hernias.

Musculoskeletal: Degenerative changes of the spine. No acute osseous
pathology.
IMPRESSION: 1. Large fluid collection/abscess in the lower abdomen abutting the
cecum and appendectomy suture. Sutural dehiscence and leak is not
excluded. Surgical consult is advised.
2. No bowel obstruction.
3. Aortic Atherosclerosis ([id number]-VL3.3).

## 2023-11-14 ENCOUNTER — Encounter (HOSPITAL_COMMUNITY): Payer: Self-pay | Admitting: Emergency Medicine

## 2023-11-14 ENCOUNTER — Other Ambulatory Visit: Payer: Self-pay

## 2023-11-14 ENCOUNTER — Emergency Department (HOSPITAL_COMMUNITY)
Admission: EM | Admit: 2023-11-14 | Discharge: 2023-11-15 | Disposition: A | Discharge status: Home / Self Care | Attending: Emergency Medicine | Admitting: Emergency Medicine

## 2023-11-14 DIAGNOSIS — R300 Dysuria: Secondary | ICD-10-CM | POA: Insufficient documentation

## 2023-11-14 DIAGNOSIS — E1122 Type 2 diabetes mellitus with diabetic chronic kidney disease: Secondary | ICD-10-CM | POA: Insufficient documentation

## 2023-11-14 DIAGNOSIS — I129 Hypertensive chronic kidney disease with stage 1 through stage 4 chronic kidney disease, or unspecified chronic kidney disease: Secondary | ICD-10-CM | POA: Insufficient documentation

## 2023-11-14 DIAGNOSIS — E876 Hypokalemia: Secondary | ICD-10-CM | POA: Diagnosis not present

## 2023-11-14 DIAGNOSIS — R319 Hematuria, unspecified: Secondary | ICD-10-CM | POA: Insufficient documentation

## 2023-11-14 DIAGNOSIS — Z7984 Long term (current) use of oral hypoglycemic drugs: Secondary | ICD-10-CM | POA: Insufficient documentation

## 2023-11-14 DIAGNOSIS — N400 Enlarged prostate without lower urinary tract symptoms: Secondary | ICD-10-CM | POA: Diagnosis not present

## 2023-11-14 DIAGNOSIS — D72829 Elevated white blood cell count, unspecified: Secondary | ICD-10-CM | POA: Diagnosis not present

## 2023-11-14 DIAGNOSIS — Z79899 Other long term (current) drug therapy: Secondary | ICD-10-CM | POA: Diagnosis not present

## 2023-11-14 DIAGNOSIS — N189 Chronic kidney disease, unspecified: Secondary | ICD-10-CM | POA: Insufficient documentation

## 2023-11-14 DIAGNOSIS — R3915 Urgency of urination: Secondary | ICD-10-CM | POA: Diagnosis not present

## 2023-11-14 LAB — CBC
HCT: 45.7 % (ref 39.0–52.0)
Hemoglobin: 14.6 g/dL (ref 13.0–17.0)
MCH: 29.9 pg (ref 26.0–34.0)
MCHC: 31.9 g/dL (ref 30.0–36.0)
MCV: 93.6 fL (ref 80.0–100.0)
Platelets: 142 K/uL — ABNORMAL LOW (ref 150–400)
RBC: 4.88 MIL/uL (ref 4.22–5.81)
RDW: 15.4 % (ref 11.5–15.5)
WBC: 14.6 K/uL — ABNORMAL HIGH (ref 4.0–10.5)
nRBC: 0 % (ref 0.0–0.2)

## 2023-11-14 LAB — URINALYSIS, ROUTINE W REFLEX MICROSCOPIC
Bacteria, UA: NONE SEEN
Bilirubin Urine: NEGATIVE
Glucose, UA: >=500 mg/dL — AB
Ketones, ur: NEGATIVE mg/dL
Nitrite: NEGATIVE
Protein, ur: 100 mg/dL — AB
RBC / HPF: >50 RBC/hpf (ref 0–5)
Specific Gravity, Urine: 1.021 (ref 1.005–1.030)
WBC, UA: >50 WBC/hpf (ref 0–5)
pH: 5 (ref 5.0–8.0)

## 2023-11-14 NOTE — ED Triage Notes (Signed)
 Pt to the ED with complaints of hematuria that began tonight.  Pt endorses painful urination and increased frequency.

## 2023-11-14 NOTE — ED Provider Notes (Signed)
 [place] EMERGENCY DEPARTMENT AT [facility] Provider Note   CSN: [id number] Arrival date & time: 11/14/23  2205     Patient presents with: Hematuria   Blake Rodriguez is a 73 y.o. male.    Hematuria  Patient presents for hematuria.  Medical history includes HTN, DM, HLD, CKD, arthritis.  He is not on a blood thinner.  Today, he had onset of urinary urgency and hematuria.  He describes a blood tinge to his urine that has been persistent throughout the day.  He also had some dysuria which has improved tonight.  He had some low back pain earlier in the day but this has resolved.  He has had chills.  His daughter, who accompanies him in the ED, states that he has had some waxing and waning confusion over the past several days.     Prior to Admission medications   Medication Sig Start Date End Date Taking? Authorizing Provider  cefpodoxime  (VANTIN ) 200 MG tablet Take 1 tablet (200 mg total) by mouth 2 (two) times daily for 7 days. 11/15/23 11/22/23 Yes Melvenia Motto, MD  tamsulosin  (FLOMAX ) 0.4 MG CAPS capsule Take 1 capsule (0.4 mg total) by mouth daily. 11/15/23 12/15/23 Yes Melvenia Motto, MD  acetaminophen  (TYLENOL ) 325 MG tablet Take 2 tablets (650 mg total) by mouth every 6 (six) hours as needed for mild pain, fever or headache. 04/28/21   Pearlean Manus, MD  atorvastatin  (LIPITOR) 80 MG tablet Take 80 mg by mouth every evening. 08/19/17   [provider]  Cholecalciferol (VITAMIN D3 PO) Take 2 capsules by mouth daily.    [provider]  cyanocobalamin  100 MCG tablet Take 3 tablets by mouth daily. 05/05/21   [provider]  empagliflozin  (JARDIANCE ) 25 MG TABS tablet Take 25 mg by mouth in the morning. 10/15/20   [provider]  escitalopram  (LEXAPRO ) 10 MG tablet Take 10 mg by mouth at bedtime.    [provider]  ezetimibe  (ZETIA ) 10 MG tablet Take 10 mg by mouth in the morning.    [provider]  metFORMIN  (GLUCOPHAGE -XR)  500 MG 24 hr tablet Take 500 mg by mouth every evening. 08/23/17   [provider]  metoprolol  tartrate (LOPRESSOR ) 25 MG tablet Take 1 tablet (25 mg total) by mouth 2 (two) times daily. 07/08/21   Evonnie Lenis, MD  NIFEdipine  (PROCARDIA  XL/NIFEDICAL-XL) 90 MG 24 hr tablet Take 1 tablet (90 mg total) by mouth in the morning. 08/05/21   Kallie Manuelita BROCKS, MD  pioglitazone (ACTOS) 15 MG tablet Take 15 mg by mouth daily. 05/15/21   [provider]  tiZANidine  (ZANAFLEX ) 4 MG tablet Take 4 mg by mouth at bedtime as needed for muscle spasms. 05/09/21   [provider]    Allergies: Patient has no known allergies.    Review of Systems  Constitutional:  Positive for chills.  Genitourinary:  Positive for dysuria, frequency, hematuria and urgency.  Musculoskeletal:  Positive for back pain.  All other systems reviewed and are negative.   Updated Vital Signs BP (!) 167/72 (BP Location: Right Arm)   Pulse 100   Temp 98.6 F (37 C) (Oral)   Resp 18   Ht 5' 11 (1.803 m)   Wt 75.3 kg   SpO2 97%   BMI 23.15 kg/m   Physical Exam Vitals and nursing note reviewed.  Constitutional:      General: He is not in acute distress.    Appearance: Normal appearance. He is  well-developed. He is not ill-appearing, toxic-appearing or diaphoretic.  HENT:     Head: Normocephalic and atraumatic.     Right Ear: External ear normal.     Left Ear: External ear normal.     Nose: Nose normal.     Mouth/Throat:     Mouth: Mucous membranes are moist.  Eyes:     Extraocular Movements: Extraocular movements intact.     Conjunctiva/sclera: Conjunctivae normal.  Cardiovascular:     Rate and Rhythm: Normal rate and regular rhythm.  Pulmonary:     Effort: Pulmonary effort is normal. No respiratory distress.  Abdominal:     General: There is no distension.     Palpations: Abdomen is soft.     Tenderness: There is no abdominal tenderness. There is no right CVA tenderness or left CVA tenderness.   Musculoskeletal:        General: No swelling. Normal range of motion.     Cervical back: Normal range of motion and neck supple.  Skin:    General: Skin is warm and dry.     Coloration: Skin is not jaundiced or pale.  Neurological:     General: No focal deficit present.     Mental Status: He is alert and oriented to person, place, and time.  Psychiatric:        Mood and Affect: Mood normal.        Behavior: Behavior normal.     (all labs ordered are listed, but only abnormal results are displayed) Labs Reviewed  URINALYSIS, ROUTINE W REFLEX MICROSCOPIC - Abnormal; Notable for the following components:      Result Value   APPearance HAZY (*)    Glucose, UA >=500 (*)    Hgb urine dipstick LARGE (*)    Protein, ur 100 (*)    Leukocytes,Ua SMALL (*)    All other components within normal limits  BASIC METABOLIC PANEL WITH GFR - Abnormal; Notable for the following components:   Potassium 3.1 (*)    Glucose, Bld 136 (*)    Creatinine, Ser 1.69 (*)    Calcium  8.6 (*)    GFR, Estimated 43 (*)    All other components within normal limits  CBC - Abnormal; Notable for the following components:   WBC 14.6 (*)    Platelets 142 (*)    All other components within normal limits  URINE CULTURE  MAGNESIUM     EKG: None  Radiology: CT Renal Stone Study Result Date: 11/15/2023 CLINICAL DATA:  Abdominal/flank pain, stone suspected EXAM: CT ABDOMEN AND PELVIS WITHOUT CONTRAST TECHNIQUE: Multidetector CT imaging of the abdomen and pelvis was performed following the standard protocol without IV contrast. RADIATION DOSE REDUCTION: This exam was performed according to the departmental dose-optimization program which includes automated exposure control, adjustment of the mA and/or kV according to patient size and/or use of iterative reconstruction technique. COMPARISON:  CT abdomen pelvis 10/16/2021 FINDINGS: Lower chest: Emphysematous changes.  No acute abnormality. Hepatobiliary: No focal liver  abnormality. No gallstones, gallbladder wall thickening, or pericholecystic fluid. No biliary dilatation. Pancreas: No focal lesion. Normal pancreatic contour. No surrounding inflammatory changes. No main pancreatic ductal dilatation. Spleen: Normal in size without focal abnormality. Adrenals/Urinary Tract: No adrenal nodule bilaterally. No nephrolithiasis and no hydronephrosis. Fluid density lesions of the kidneys likely represent simple renal cysts. No ureterolithiasis or hydroureter. Circumferential urinary bladder wall thickening. Stomach/Bowel: Stomach is within normal limits. No evidence of bowel wall thickening or dilatation. Appendectomy. Vascular/Lymphatic: No abdominal aorta or iliac aneurysm.  Severe atherosclerotic plaque of the aorta and its branches. No abdominal, pelvic, or inguinal lymphadenopathy. Reproductive: Prominent prostate measuring up to 4.6 cm. Other: No intraperitoneal free fluid. No intraperitoneal free gas. No organized fluid collection. Musculoskeletal: Small left inguinal hernia containing fat and a short segment of the sigmoid colon (2:71). Healed anterior abdominal incision. No suspicious lytic or blastic osseous lesions. No acute displaced fracture. Multilevel degenerative changes of the spine. IMPRESSION: 1. Findings suggest cystitis-correlate with urinalysis for infection. 2. Small left inguinal hernia containing fat and a short segment of the sigmoid colon. No associated findings to suggest ischemia or bowel obstruction. 3. Prominent prostate. 4. Aortic Atherosclerosis (ICD10-I70.0) and Emphysema (ICD10-J43.9). Electronically Signed   By: Morgane  Naveau M.D.   On: 11/15/2023 01:09     Procedures   Medications Ordered in the ED  cefTRIAXone  (ROCEPHIN ) 1 g in sodium chloride  0.9 % 100 mL IVPB (has no administration in time range)  tamsulosin  (FLOMAX ) capsule 0.4 mg (has no administration in time range)  lactated ringers  bolus 500 mL (500 mLs Intravenous New Bag/Given  11/15/23 0043)  potassium chloride  SA (KLOR-CON  M) CR tablet 40 mEq (40 mEq Oral Given 11/15/23 0108)                                    Medical Decision Making Amount and/or Complexity of Data Reviewed Labs: ordered. Radiology: ordered.  Risk Prescription drug management.   This patient presents to the ED for concern of hematuria, this involves an extensive number of treatment options, and is a complaint that carries with it a high risk of complications and morbidity.  The differential diagnosis includes UTI, nephrolithiasis, neoplasm, AAA   Co morbidities / Chronic conditions that complicate the patient evaluation  HTN, DM, HLD, CKD, arthritis   Additional history obtained:  Additional history obtained from EMR External records from outside source obtained and reviewed including patient's daughter   Lab Tests:  I Ordered, and personally interpreted labs.  The pertinent results include: A leukocytosis is present.  Hypokalemia is present with otherwise normal electrolytes.  Creatinine is mildly increased from baseline.  Urinalysis shows hematuria and pyuria without bacteriuria   Imaging Studies ordered:  I ordered imaging studies including CT of abdomen and pelvis I independently visualized and interpreted imaging which showed no obstructive uropathy.  There is circumferential urinary bladder wall thickening, concerning for cystitis.  Prominent prostate is present. I agree with the radiologist interpretation   Cardiac Monitoring: / EKG:  The patient was maintained on a cardiac monitor.  I personally viewed and interpreted the cardiac monitored which showed an underlying rhythm of: Sinus rhythm   Problem List / ED Course / Critical interventions / Medication management  Patient presenting for new onset urinary urgency, dysuria, and hematuria.  On arrival in the ED, he is well-appearing. Abdomen is soft and nontender.  No CVA tenderness is present.  Urinalysis shows  hematuria and pyuria without bacteriuria.   Serum lab work is notable for leukocytosis.  CT imaging shows prominent prostate with circumferential urinary bladder wall thickening, concerning for cystitis.  Given the patient's recent chills and what his daughter describes as perhaps some subtle delirium, we will treat empirically for UTI.  Urine cultures were ordered.  Given his enlarged prostate, patient to be started on tamsulosin  and follow-up with urology.  Initial doses of medications were given in the ED.  Patient was discharged in stable condition.  I ordered medication including IV fluids for hydration, ceftriaxone  for UTI, tamsulosin  for urinary frequency, potassium chloride  for hypokalemia Reevaluation of the patient after these medicines showed that the patient improved I have reviewed the patients home medicines and have made adjustments as needed  Social Determinants of Health:  Lives at home with family     Final diagnoses:  Hematuria, unspecified type  Urinary urgency  Hypokalemia  Enlarged prostate  Dysuria    ED Discharge Orders          Ordered    cefpodoxime  (VANTIN ) 200 MG tablet  2 times daily        11/15/23 0135    tamsulosin  (FLOMAX ) 0.4 MG CAPS capsule  Daily        11/15/23 0135               Melvenia Motto, MD 11/15/23 [phone]

## 2023-11-15 ENCOUNTER — Emergency Department (HOSPITAL_COMMUNITY)

## 2023-11-15 LAB — BASIC METABOLIC PANEL WITH GFR
Anion gap: 14 (ref 5–15)
BUN: 15 mg/dL (ref 8–23)
CO2: 23 mmol/L (ref 22–32)
Calcium: 8.6 mg/dL — ABNORMAL LOW (ref 8.9–10.3)
Chloride: 105 mmol/L (ref 98–111)
Creatinine, Ser: 1.69 mg/dL — ABNORMAL HIGH (ref 0.61–1.24)
GFR, Estimated: 43 mL/min — ABNORMAL LOW (ref >60)
Glucose, Bld: 136 mg/dL — ABNORMAL HIGH (ref 70–99)
Potassium: 3.1 mmol/L — ABNORMAL LOW (ref 3.5–5.1)
Sodium: 142 mmol/L (ref 135–145)

## 2023-11-15 LAB — MAGNESIUM: Magnesium: 1.8 mg/dL (ref 1.7–2.4)

## 2023-11-15 MED ORDER — POTASSIUM CHLORIDE CRYS ER 20 MEQ PO TBCR
40.0000 meq | EXTENDED_RELEASE_TABLET | Freq: Once | ORAL | Status: AC
  Administered 2023-11-15: 40 meq via ORAL
  Filled 2023-11-15: qty 2

## 2023-11-15 MED ORDER — TAMSULOSIN HCL 0.4 MG PO CAPS: 0.4000 mg | ORAL_CAPSULE | Freq: Every day | ORAL | 0 refills | Status: AC

## 2023-11-15 MED ORDER — CEFPODOXIME PROXETIL 200 MG PO TABS
200.0000 mg | ORAL_TABLET | Freq: Two times a day (BID) | ORAL | 0 refills | Status: AC
Start: 2023-11-15 — End: 2023-11-22

## 2023-11-15 MED ORDER — TAMSULOSIN HCL 0.4 MG PO CAPS
0.4000 mg | ORAL_CAPSULE | Freq: Once | ORAL | Status: AC
Start: 2023-11-15 — End: 2023-11-15
  Administered 2023-11-15: 0.4 mg via ORAL
  Filled 2023-11-15: qty 1

## 2023-11-15 MED ORDER — LACTATED RINGERS IV BOLUS
500.0000 mL | Freq: Once | INTRAVENOUS | Status: AC
  Administered 2023-11-15: 500 mL via INTRAVENOUS

## 2023-11-15 MED ORDER — SODIUM CHLORIDE 0.9 % IV SOLN
1.0000 g | Freq: Once | INTRAVENOUS | Status: AC
  Administered 2023-11-15: 1 g via INTRAVENOUS
  Filled 2023-11-15: qty 10

## 2023-11-15 MED ORDER — METOPROLOL TARTRATE 25 MG PO TABS
25.0000 mg | ORAL_TABLET | Freq: Once | ORAL | Status: AC
  Administered 2023-11-15: 25 mg via ORAL
  Filled 2023-11-15: qty 1

## 2023-11-15 MED ORDER — LACTATED RINGERS IV BOLUS
500.0000 mL | Freq: Once | INTRAVENOUS | Status: AC
Start: 2023-11-15 — End: 2023-11-15
  Administered 2023-11-15: 500 mL via INTRAVENOUS

## 2023-11-15 NOTE — Discharge Instructions (Addendum)
 Overall, your test results today were reassuring.    You were given a dose of antibiotics for empiric treatment of a UTI.  This will cover you until tomorrow evening.  A prescription for ongoing antibiotics was sent to your pharmacy.  Take as prescribed.    Hold off on your Jardiance  during treatment for UTI.    Urine cultures from tonight should be available in 1 to 2 days.  Follow-up with PCP or urology to discuss results.  A telephone number for the urology office is below.  Call in the morning to set up a follow-up appointment.    A prescription for a medication called tamsulosin  was also sent to your pharmacy.  This can help with urinary frequency and urgency caused by a large prostate.  Take daily as prescribed.  Discuss further with urology.    If you have any new or worsening symptoms, return to the emergency department.

## 2023-11-17 ENCOUNTER — Ambulatory Visit: Admitting: Urology

## 2023-11-17 LAB — URINE CULTURE: Culture: 70000 — AB

## 2023-11-18 ENCOUNTER — Telehealth (HOSPITAL_BASED_OUTPATIENT_CLINIC_OR_DEPARTMENT_OTHER): Payer: Self-pay

## 2023-11-18 NOTE — Telephone Encounter (Signed)
 Post ED Visit - Positive Culture Follow-up  Culture report reviewed by antimicrobial stewardship pharmacist: [facility] [x]  Koren Or, Pharm.D. []  Venetia Gully, Pharm.D., BCPS AQ-ID []  Garrel Crews, Pharm.D., BCPS []  Almarie Lunger, Pharm.D., BCPS []  [place], [place].D., BCPS, AAHIVP []  Rosaline Bihari, Pharm.D., BCPS, AAHIVP []  Vernell Meier, PharmD, BCPS []  Latanya Hint, PharmD, BCPS []  Donald Medley, PharmD, BCPS []  Rocky Bold, PharmD []  Dorothyann Alert, PharmD, BCPS []  Morene Babe, PharmD  [facility] []  Rosaline Edison, PharmD []  Romona Bliss, PharmD []  Dolphus Roller, PharmD []  Veva Seip, Rph []  Vernell Daunt) Leonce, PharmD []  Eva Allis, PharmD []  Rosaline Millet, PharmD []  Iantha Batch, PharmD []  Arvin Gauss, PharmD []  Wanda Hasting, PharmD []  Ronal Rav, PharmD []  Rocky Slade, PharmD []  Bard Jeans, PharmD   Positive urine culture Treated with Cefpodoxime , organism sensitive to the same and no further patient follow-up is required at this time.  Ruth Camelia Elbe 11/18/2023, 10:57 AM

## 2024-01-11 ENCOUNTER — Ambulatory Visit: Admitting: Urology
